# Patient Record
Sex: Male | Born: 1935
Health system: Southern US, Community
[De-identification: ages and names within clinical notes are randomized; demographics above are authoritative.]

## PROBLEM LIST (undated history)

## (undated) DIAGNOSIS — K589 Irritable bowel syndrome without diarrhea: Secondary | ICD-10-CM

## (undated) DIAGNOSIS — J309 Allergic rhinitis, unspecified: Secondary | ICD-10-CM

## (undated) DIAGNOSIS — N281 Cyst of kidney, acquired: Secondary | ICD-10-CM

## (undated) DIAGNOSIS — M1990 Unspecified osteoarthritis, unspecified site: Secondary | ICD-10-CM

## (undated) DIAGNOSIS — E785 Hyperlipidemia, unspecified: Secondary | ICD-10-CM

## (undated) DIAGNOSIS — K219 Gastro-esophageal reflux disease without esophagitis: Secondary | ICD-10-CM

## (undated) DIAGNOSIS — R109 Unspecified abdominal pain: Secondary | ICD-10-CM

## (undated) DIAGNOSIS — K5730 Diverticulosis of large intestine without perforation or abscess without bleeding: Secondary | ICD-10-CM

## (undated) DIAGNOSIS — I2510 Atherosclerotic heart disease of native coronary artery without angina pectoris: Secondary | ICD-10-CM

## (undated) DIAGNOSIS — M545 Low back pain, unspecified: Secondary | ICD-10-CM

## (undated) DIAGNOSIS — I255 Ischemic cardiomyopathy: Secondary | ICD-10-CM

## (undated) DIAGNOSIS — G43909 Migraine, unspecified, not intractable, without status migrainosus: Secondary | ICD-10-CM

## (undated) DIAGNOSIS — I10 Essential (primary) hypertension: Secondary | ICD-10-CM

## (undated) DIAGNOSIS — R972 Elevated prostate specific antigen [PSA]: Secondary | ICD-10-CM

## (undated) HISTORY — DX: Ischemic cardiomyopathy: I25.5

## (undated) HISTORY — PX: ANGIOPLASTY: SHX39

## (undated) HISTORY — PX: CARDIAC CATHETERIZATION: SHX172

## (undated) HISTORY — DX: Diverticulosis of large intestine without perforation or abscess without bleeding: K57.30

## (undated) HISTORY — DX: Cyst of kidney, acquired: N28.1

## (undated) HISTORY — DX: Essential (primary) hypertension: I10

## (undated) HISTORY — DX: Unspecified abdominal pain: R10.9

## (undated) HISTORY — DX: Elevated prostate specific antigen (PSA): R97.20

## (undated) HISTORY — DX: Unspecified osteoarthritis, unspecified site: M19.90

## (undated) HISTORY — DX: Gastro-esophageal reflux disease without esophagitis: K21.9

## (undated) HISTORY — DX: Irritable bowel syndrome, unspecified: K58.9

## (undated) HISTORY — PX: CORONARY ANGIOPLASTY: SHX604

## (undated) HISTORY — DX: Low back pain, unspecified: M54.50

## (undated) HISTORY — DX: Atherosclerotic heart disease of native coronary artery without angina pectoris: I25.10

## (undated) HISTORY — DX: Allergic rhinitis, unspecified: J30.9

## (undated) HISTORY — DX: Hyperlipidemia, unspecified: E78.5

## (undated) HISTORY — DX: Migraine, unspecified, not intractable, without status migrainosus: G43.909

## (undated) HISTORY — DX: Low back pain: M54.5

## (undated) HISTORY — PX: OTHER SURGICAL HISTORY: SHX169

---

## 1999-12-25 ENCOUNTER — Other Ambulatory Visit: Admission: RE | Admit: 1999-12-25 | Discharge: 1999-12-25 | Payer: Self-pay | Admitting: Urology

## 1999-12-25 ENCOUNTER — Encounter (INDEPENDENT_AMBULATORY_CARE_PROVIDER_SITE_OTHER): Payer: Self-pay | Admitting: Specialist

## 2002-04-12 ENCOUNTER — Inpatient Hospital Stay (HOSPITAL_COMMUNITY): Admission: EM | Admit: 2002-04-12 | Discharge: 2002-04-14 | Payer: Self-pay | Admitting: Orthopedic Surgery

## 2002-07-10 ENCOUNTER — Encounter: Admission: RE | Admit: 2002-07-10 | Discharge: 2002-08-21 | Payer: Self-pay | Admitting: Orthopedic Surgery

## 2003-07-06 ENCOUNTER — Encounter (INDEPENDENT_AMBULATORY_CARE_PROVIDER_SITE_OTHER): Payer: Self-pay | Admitting: *Deleted

## 2004-02-14 ENCOUNTER — Observation Stay (HOSPITAL_COMMUNITY): Admission: RE | Admit: 2004-02-14 | Discharge: 2004-02-15 | Payer: Self-pay | Admitting: Specialist

## 2004-08-01 ENCOUNTER — Ambulatory Visit: Payer: Self-pay | Admitting: Pulmonary Disease

## 2004-08-15 ENCOUNTER — Ambulatory Visit (HOSPITAL_COMMUNITY): Admission: RE | Admit: 2004-08-15 | Discharge: 2004-08-15 | Payer: Self-pay | Admitting: Pulmonary Disease

## 2004-10-28 ENCOUNTER — Ambulatory Visit: Payer: Self-pay | Admitting: Pulmonary Disease

## 2004-12-20 HISTORY — PX: OTHER SURGICAL HISTORY: SHX169

## 2004-12-25 ENCOUNTER — Observation Stay (HOSPITAL_COMMUNITY): Admission: RE | Admit: 2004-12-25 | Discharge: 2004-12-26 | Payer: Self-pay | Admitting: Surgery

## 2006-02-10 ENCOUNTER — Ambulatory Visit: Payer: Self-pay | Admitting: Pulmonary Disease

## 2006-05-04 ENCOUNTER — Ambulatory Visit: Payer: Self-pay | Admitting: Pulmonary Disease

## 2006-08-19 ENCOUNTER — Ambulatory Visit: Payer: Self-pay | Admitting: Pulmonary Disease

## 2006-08-30 ENCOUNTER — Ambulatory Visit: Payer: Self-pay | Admitting: Pulmonary Disease

## 2007-07-01 ENCOUNTER — Encounter: Payer: Self-pay | Admitting: Pulmonary Disease

## 2007-07-22 ENCOUNTER — Ambulatory Visit: Payer: Self-pay | Admitting: Pulmonary Disease

## 2007-07-22 ENCOUNTER — Telehealth (INDEPENDENT_AMBULATORY_CARE_PROVIDER_SITE_OTHER): Payer: Self-pay | Admitting: *Deleted

## 2007-07-22 DIAGNOSIS — M1990 Unspecified osteoarthritis, unspecified site: Secondary | ICD-10-CM | POA: Insufficient documentation

## 2007-07-22 DIAGNOSIS — R109 Unspecified abdominal pain: Secondary | ICD-10-CM | POA: Insufficient documentation

## 2007-07-22 DIAGNOSIS — J309 Allergic rhinitis, unspecified: Secondary | ICD-10-CM

## 2007-07-22 DIAGNOSIS — K589 Irritable bowel syndrome without diarrhea: Secondary | ICD-10-CM

## 2007-07-22 LAB — CONVERTED CEMR LAB
ALT: 21 units/L (ref 0–53)
AST: 10 units/L (ref 0–37)
Albumin: 3.8 g/dL (ref 3.5–5.2)
Alkaline Phosphatase: 76 units/L (ref 39–117)
BUN: 19 mg/dL (ref 6–23)
Bacteria, UA: NEGATIVE
Basophils Absolute: 0 10*3/uL (ref 0.0–0.1)
Bilirubin, Direct: 0.3 mg/dL (ref 0.0–0.3)
Chloride: 102 meq/L (ref 96–112)
Eosinophils Absolute: 0.1 10*3/uL (ref 0.0–0.6)
Eosinophils Relative: 0.8 % (ref 0.0–5.0)
GFR calc Af Amer: 122 mL/min
GFR calc non Af Amer: 101 mL/min
HCT: 47.4 % (ref 39.0–52.0)
Hemoglobin: 15.9 g/dL (ref 13.0–17.0)
MCV: 90.1 fL (ref 78.0–100.0)
Monocytes Absolute: 0.8 10*3/uL — ABNORMAL HIGH (ref 0.2–0.7)
Mucus, UA: NEGATIVE
Neutro Abs: 8.2 10*3/uL — ABNORMAL HIGH (ref 1.4–7.7)
Nitrite: NEGATIVE
Potassium: 4 meq/L (ref 3.5–5.1)
RBC: 5.26 M/uL (ref 4.22–5.81)
Sed Rate: 27 mm/hr — ABNORMAL HIGH (ref 0–20)
Sodium: 139 meq/L (ref 135–145)
Specific Gravity, Urine: >=1.03 (ref 1.000–1.03)
Total Bilirubin: 2 mg/dL — ABNORMAL HIGH (ref 0.3–1.2)
pH: 5 (ref 5.0–8.0)

## 2007-07-29 ENCOUNTER — Telehealth: Payer: Self-pay | Admitting: Pulmonary Disease

## 2007-08-12 ENCOUNTER — Encounter: Payer: Self-pay | Admitting: Pulmonary Disease

## 2007-08-12 ENCOUNTER — Encounter: Payer: Self-pay | Admitting: Adult Health

## 2007-08-23 ENCOUNTER — Ambulatory Visit: Payer: Self-pay | Admitting: Pulmonary Disease

## 2007-08-23 DIAGNOSIS — R972 Elevated prostate specific antigen [PSA]: Secondary | ICD-10-CM

## 2007-08-23 DIAGNOSIS — K5730 Diverticulosis of large intestine without perforation or abscess without bleeding: Secondary | ICD-10-CM | POA: Insufficient documentation

## 2007-08-23 DIAGNOSIS — G43909 Migraine, unspecified, not intractable, without status migrainosus: Secondary | ICD-10-CM | POA: Insufficient documentation

## 2007-08-23 DIAGNOSIS — M545 Low back pain: Secondary | ICD-10-CM

## 2007-08-23 DIAGNOSIS — N281 Cyst of kidney, acquired: Secondary | ICD-10-CM | POA: Insufficient documentation

## 2007-08-24 ENCOUNTER — Ambulatory Visit (HOSPITAL_COMMUNITY): Admission: RE | Admit: 2007-08-24 | Discharge: 2007-08-24 | Payer: Self-pay | Admitting: Pulmonary Disease

## 2007-11-24 ENCOUNTER — Ambulatory Visit: Payer: Self-pay | Admitting: Internal Medicine

## 2007-11-24 DIAGNOSIS — L500 Allergic urticaria: Secondary | ICD-10-CM | POA: Insufficient documentation

## 2007-11-24 LAB — CONVERTED CEMR LAB
Basophils Relative: 0.1 % (ref 0.0–1.0)
Eosinophils Relative: 1.5 % (ref 0.0–5.0)
Hemoglobin: 14.9 g/dL (ref 13.0–17.0)
MCHC: 35.3 g/dL (ref 30.0–36.0)
MCV: 89.2 fL (ref 78.0–100.0)
Neutro Abs: 4.7 10*3/uL (ref 1.4–7.7)
WBC: 6.6 10*3/uL (ref 4.5–10.5)

## 2007-11-25 ENCOUNTER — Telehealth (INDEPENDENT_AMBULATORY_CARE_PROVIDER_SITE_OTHER): Payer: Self-pay | Admitting: *Deleted

## 2007-12-15 ENCOUNTER — Telehealth (INDEPENDENT_AMBULATORY_CARE_PROVIDER_SITE_OTHER): Payer: Self-pay | Admitting: *Deleted

## 2007-12-16 ENCOUNTER — Ambulatory Visit: Payer: Self-pay | Admitting: Internal Medicine

## 2008-02-13 ENCOUNTER — Ambulatory Visit: Payer: Self-pay | Admitting: Pulmonary Disease

## 2008-02-13 DIAGNOSIS — J069 Acute upper respiratory infection, unspecified: Secondary | ICD-10-CM | POA: Insufficient documentation

## 2008-02-21 ENCOUNTER — Telehealth (INDEPENDENT_AMBULATORY_CARE_PROVIDER_SITE_OTHER): Payer: Self-pay | Admitting: *Deleted

## 2008-03-21 ENCOUNTER — Ambulatory Visit: Payer: Self-pay | Admitting: Pulmonary Disease

## 2008-10-21 ENCOUNTER — Inpatient Hospital Stay (HOSPITAL_COMMUNITY): Admission: EM | Admit: 2008-10-21 | Discharge: 2008-10-24 | Payer: Self-pay | Admitting: Emergency Medicine

## 2008-10-21 ENCOUNTER — Ambulatory Visit: Payer: Self-pay | Admitting: Cardiology

## 2008-10-22 ENCOUNTER — Encounter: Payer: Self-pay | Admitting: Cardiology

## 2008-10-26 ENCOUNTER — Telehealth: Payer: Self-pay | Admitting: Cardiology

## 2008-10-26 ENCOUNTER — Telehealth: Payer: Self-pay | Admitting: Pulmonary Disease

## 2008-11-08 ENCOUNTER — Encounter (HOSPITAL_COMMUNITY): Admission: RE | Admit: 2008-11-08 | Discharge: 2009-02-06 | Payer: Self-pay | Admitting: Cardiology

## 2008-11-12 ENCOUNTER — Ambulatory Visit: Payer: Self-pay | Admitting: Cardiology

## 2008-11-12 DIAGNOSIS — E7800 Pure hypercholesterolemia, unspecified: Secondary | ICD-10-CM

## 2008-11-12 DIAGNOSIS — I2510 Atherosclerotic heart disease of native coronary artery without angina pectoris: Secondary | ICD-10-CM | POA: Insufficient documentation

## 2008-11-12 DIAGNOSIS — I10 Essential (primary) hypertension: Secondary | ICD-10-CM

## 2008-12-12 ENCOUNTER — Encounter: Payer: Self-pay | Admitting: Cardiology

## 2008-12-25 ENCOUNTER — Telehealth: Payer: Self-pay | Admitting: Cardiology

## 2009-02-01 ENCOUNTER — Encounter: Payer: Self-pay | Admitting: Pulmonary Disease

## 2009-02-04 ENCOUNTER — Ambulatory Visit: Payer: Self-pay | Admitting: Cardiology

## 2009-02-04 LAB — CONVERTED CEMR LAB
AST: 24 units/L (ref 0–37)
Albumin: 3.8 g/dL (ref 3.5–5.2)
Bilirubin, Direct: 0.2 mg/dL (ref 0.0–0.3)
Cholesterol: 99 mg/dL (ref 0–200)
HDL: 27.6 mg/dL — ABNORMAL LOW (ref >39.00)
Total CHOL/HDL Ratio: 4
Total Protein: 6 g/dL (ref 6.0–8.3)
Triglycerides: 157 mg/dL — ABNORMAL HIGH (ref 0.0–149.0)
VLDL: 31.4 mg/dL (ref 0.0–40.0)

## 2009-02-05 ENCOUNTER — Ambulatory Visit: Payer: Self-pay | Admitting: Cardiology

## 2009-02-05 DIAGNOSIS — R5383 Other fatigue: Secondary | ICD-10-CM

## 2009-02-07 ENCOUNTER — Ambulatory Visit: Payer: Self-pay

## 2009-02-07 ENCOUNTER — Encounter: Payer: Self-pay | Admitting: Cardiology

## 2009-02-26 ENCOUNTER — Telehealth: Payer: Self-pay | Admitting: Cardiology

## 2009-03-08 ENCOUNTER — Encounter: Payer: Self-pay | Admitting: Cardiology

## 2009-04-15 ENCOUNTER — Encounter: Payer: Self-pay | Admitting: Pulmonary Disease

## 2009-04-26 ENCOUNTER — Telehealth: Payer: Self-pay | Admitting: Cardiology

## 2009-06-26 ENCOUNTER — Telehealth: Payer: Self-pay | Admitting: Cardiology

## 2009-07-04 ENCOUNTER — Telehealth: Payer: Self-pay | Admitting: Adult Health

## 2009-07-17 ENCOUNTER — Telehealth: Payer: Self-pay | Admitting: Cardiology

## 2009-08-07 ENCOUNTER — Ambulatory Visit: Payer: Self-pay | Admitting: Cardiology

## 2009-08-07 LAB — CONVERTED CEMR LAB
AST: 27 units/L (ref 0–37)
Albumin: 3.9 g/dL (ref 3.5–5.2)
Chloride: 108 meq/L (ref 96–112)
Cholesterol: 102 mg/dL (ref 0–200)
GFR calc non Af Amer: 117.15 mL/min (ref >60)
Glucose, Bld: 103 mg/dL — ABNORMAL HIGH (ref 70–99)
HDL: 43.6 mg/dL (ref >39.00)
Potassium: 3.8 meq/L (ref 3.5–5.1)
Total Bilirubin: 1.4 mg/dL — ABNORMAL HIGH (ref 0.3–1.2)
Total Protein: 6 g/dL (ref 6.0–8.3)

## 2009-08-09 ENCOUNTER — Ambulatory Visit: Payer: Self-pay | Admitting: Cardiology

## 2009-08-09 DIAGNOSIS — I2589 Other forms of chronic ischemic heart disease: Secondary | ICD-10-CM

## 2009-08-23 ENCOUNTER — Ambulatory Visit: Payer: Self-pay | Admitting: Cardiology

## 2009-08-28 LAB — CONVERTED CEMR LAB
BUN: 13 mg/dL (ref 6–23)
Creatinine, Ser: 0.7 mg/dL (ref 0.4–1.5)
GFR calc non Af Amer: 117.13 mL/min (ref >60)

## 2009-09-30 ENCOUNTER — Ambulatory Visit: Payer: Self-pay | Admitting: Cardiology

## 2009-10-07 LAB — CONVERTED CEMR LAB
AST: 22 units/L (ref 0–37)
Albumin: 3.8 g/dL (ref 3.5–5.2)
Cholesterol: 104 mg/dL (ref 0–200)
HDL: 35.1 mg/dL — ABNORMAL LOW (ref >39.00)
LDL Cholesterol: 43 mg/dL (ref 0–99)
Total CHOL/HDL Ratio: 3
Total Protein: 6.3 g/dL (ref 6.0–8.3)

## 2009-11-20 ENCOUNTER — Telehealth: Payer: Self-pay | Admitting: Cardiology

## 2009-12-10 ENCOUNTER — Encounter (INDEPENDENT_AMBULATORY_CARE_PROVIDER_SITE_OTHER): Payer: Self-pay | Admitting: *Deleted

## 2009-12-11 ENCOUNTER — Telehealth: Payer: Self-pay | Admitting: Cardiology

## 2010-01-02 ENCOUNTER — Encounter (INDEPENDENT_AMBULATORY_CARE_PROVIDER_SITE_OTHER): Payer: Self-pay | Admitting: *Deleted

## 2010-02-10 ENCOUNTER — Telehealth: Payer: Self-pay | Admitting: Cardiology

## 2010-02-13 ENCOUNTER — Ambulatory Visit: Payer: Self-pay | Admitting: Cardiology

## 2010-02-17 ENCOUNTER — Telehealth: Payer: Self-pay | Admitting: Cardiology

## 2010-02-27 ENCOUNTER — Telehealth: Payer: Self-pay | Admitting: Cardiology

## 2010-03-25 ENCOUNTER — Telehealth: Payer: Self-pay | Admitting: Cardiology

## 2010-04-10 ENCOUNTER — Ambulatory Visit: Payer: Self-pay | Admitting: Cardiology

## 2010-04-14 ENCOUNTER — Ambulatory Visit: Payer: Self-pay | Admitting: Gastroenterology

## 2010-04-14 ENCOUNTER — Telehealth: Payer: Self-pay | Admitting: Cardiology

## 2010-04-14 LAB — CONVERTED CEMR LAB
AST: 24 units/L (ref 0–37)
Alkaline Phosphatase: 52 units/L (ref 39–117)
BUN: 19 mg/dL (ref 6–23)
Bilirubin, Direct: 0.2 mg/dL (ref 0.0–0.3)
CO2: 26 meq/L (ref 19–32)
Calcium: 8.8 mg/dL (ref 8.4–10.5)
Chloride: 107 meq/L (ref 96–112)
Cholesterol: 112 mg/dL (ref 0–200)
LDL Cholesterol: 58 mg/dL (ref 0–99)
Total Bilirubin: 1.4 mg/dL — ABNORMAL HIGH (ref 0.3–1.2)
Total Protein: 6 g/dL (ref 6.0–8.3)
Triglycerides: 100 mg/dL (ref 0.0–149.0)

## 2010-05-05 ENCOUNTER — Ambulatory Visit: Payer: Self-pay | Admitting: Cardiology

## 2010-05-12 LAB — CONVERTED CEMR LAB
Calcium: 8.8 mg/dL (ref 8.4–10.5)
Creatinine, Ser: 0.7 mg/dL (ref 0.4–1.5)
GFR calc non Af Amer: 113.17 mL/min (ref >60)

## 2010-05-20 ENCOUNTER — Encounter: Payer: Self-pay | Admitting: Cardiology

## 2010-06-25 ENCOUNTER — Telehealth: Payer: Self-pay | Admitting: Cardiology

## 2010-07-09 ENCOUNTER — Encounter: Payer: Self-pay | Admitting: Cardiology

## 2010-07-09 ENCOUNTER — Ambulatory Visit: Admission: RE | Admit: 2010-07-09 | Discharge: 2010-07-09 | Payer: Self-pay | Source: Home / Self Care

## 2010-07-09 ENCOUNTER — Other Ambulatory Visit: Payer: Self-pay | Admitting: Cardiology

## 2010-07-09 LAB — BASIC METABOLIC PANEL
BUN: 20 mg/dL (ref 6–23)
CO2: 29 mEq/L (ref 19–32)
Calcium: 9.4 mg/dL (ref 8.4–10.5)
Chloride: 108 mEq/L (ref 96–112)
Creatinine, Ser: 0.8 mg/dL (ref 0.4–1.5)
GFR: 97.35 mL/min (ref >60.00)
Glucose, Bld: 97 mg/dL (ref 70–99)
Potassium: 4.4 mEq/L (ref 3.5–5.1)
Sodium: 141 mEq/L (ref 135–145)

## 2010-07-09 LAB — HEMOGLOBIN A1C: Hgb A1c MFr Bld: 5.5 % (ref 4.6–6.5)

## 2010-07-09 LAB — MAGNESIUM: Magnesium: 2.1 mg/dL (ref 1.5–2.5)

## 2010-07-09 LAB — CK: Total CK: 97 U/L (ref 7–232)

## 2010-07-22 NOTE — Progress Notes (Signed)
Summary: pt going off plavix  Phone Note Call from Patient Call back at Home Phone [phone]   Caller: Patient Reason for Call: Talk to Nurse, Talk to Doctor Summary of Call: pt has dental appt and will be off plavix for five days starting tommorrow Initial call taken by: Omer Jack,  December 11, 2009 4:16 PM  Follow-up for Phone Call        Spoke with pt. Pt. states Anne Dr. Alford Highland nurse, had asked pt. to call to let her know when he was going off Plavix for 5 days for dental work. Pt. states will be off Plavix from June 23rd to June 29th.  Ollen Gross, RN, BSN  December 11, 2009 4:31 PM      Appended Document: pt going off plavix ok to go off plavix now for dental work. restart afterwards

## 2010-07-22 NOTE — Progress Notes (Signed)
Summary: cough  Phone Note Outgoing Call   Call placed by: Katina Dung, RN, BSN,  February 27, 2010 8:45 AM Call placed to: Patient Summary of Call: cough  Follow-up for Phone Call        Pepcid started 02/13/10--call and see if Pepcid has helped cough --LM with wife for pt to call Katina Dung, RN, BSN  February 27, 2010 9:21 AM --I talked with pt--he states he thinks his cough is some better since starting Pepcid--he is going out of town for a couple of weeks and wanted to see how his cough was when he returned from his trip before making any changes--he stated he would call me back when he returns from his trip if he was still having problems with a cough and wants to make a medication change     Appended Document: cough ok.

## 2010-07-22 NOTE — Progress Notes (Signed)
Summary: cough  Phone Note Outgoing Call   Call placed by: Katina Dung, RN, BSN,  April 14, 2010 2:46 PM Call placed to: Patient Summary of Call: lab results  Follow-up for Phone Call        I called pt to give him his recent lab results--pt mentioned that he has  tried  Pepcid to see if taking Pepcid improved his cough-(he is on Lisinopril)--pt states cough is slightly improved on Pepcid --he would like to try other medication since cough is not much better --I will forward to Dr Shirlee Latch for review     Appended Document: cough OK to stop lisinopril and start losartan 50 mg daily with BMET in 2 wks.   Appended Document: cough talked with pt --he will stop Lisinopril and start Losartan 50mg  daily--he will return for BMP on 05/01/10   Clinical Lists Changes  Medications: Changed medication from LISINOPRIL 5 MG TABS (LISINOPRIL) one tablet by mouth once daily to LOSARTAN POTASSIUM 50 MG TABS (LOSARTAN POTASSIUM) one daily - Signed Rx of LOSARTAN POTASSIUM 50 MG TABS (LOSARTAN POTASSIUM) one daily;  #30 x 6;  Signed;  Entered by: Katina Dung, RN, BSN;  Authorized by: Marca Ancona, MD;  Method used: Electronically to [facility] # [id number]*, [address] [place], [place], [place]  [address], Ph: [phone] or [phone], Fax: [phone]    Prescriptions: LOSARTAN POTASSIUM 50 MG TABS (LOSARTAN POTASSIUM) one daily  #30 x 6   Entered by:   Katina Dung, RN, BSN   Authorized by:   Marca Ancona, MD   Signed by:   Katina Dung, RN, BSN on 04/15/2010   Method used:   Electronically to        [facility]. # [id number]* (retail)       [address]       [place], [place]  [address]       Ph: [phone] or [phone]       Fax: [phone]   RxID:   [id number]

## 2010-07-22 NOTE — Progress Notes (Signed)
Summary: pt needs asap he leaving town   Phone Note Refill Request Call back at [facility] [phone] Message from:  [facility] [address]  Refills Requested: Medication #1:  NITROGLYCERIN 0.4 MG SUBL take as needed needs asap  Initial call taken by: Omer Jack,  February 17, 2010 12:06 PM  Follow-up for Phone Call        Rx completed in Dr. Tiajuana Amass notified pt Follow-up by: Hardin Negus, RMA,  February 17, 2010 3:12 PM

## 2010-07-22 NOTE — Letter (Signed)
Summary: New Patient letter  [facility]  [address] [place], [place] [address]   Phone: [phone]  Fax: [phone]       01/02/2010 MRN: [id number]  Willie Lynch [address] [place], [place]  [address]  Dear Willie Lynch,  Welcome to the Gastroenterology Division at [facility].    You are scheduled to see Dr.  Christella Hartigan  on 02/21/2010 at 1:30pm on the 3rd floor at [facility], [address]  We ask that you try to arrive at our office 15 minutes prior to your appointment time to allow for check-in.  We would like you to complete the enclosed self-administered evaluation form prior to your visit and bring it with you on the day of your appointment.  We will review it with you.  Also, please bring a complete list of all your medications or, if you prefer, bring the medication bottles and we will list them.  Please bring your insurance card so that we may make a copy of it.  If your insurance requires a referral to see a specialist, please bring your referral form from your primary care physician.  Co-payments are due at the time of your visit and may be paid by cash, check or credit card.     Your office visit will consist of a consult with your physician (includes a physical exam), any laboratory testing he/she may order, scheduling of any necessary diagnostic testing (e.g. x-ray, ultrasound, CT-scan), and scheduling of a procedure (e.g. Endoscopy, Colonoscopy) if required.  Please allow enough time on your schedule to allow for any/all of these possibilities.    If you cannot keep your appointment, please call [phone] to cancel or reschedule prior to your appointment date.  This allows [place] the opportunity to schedule an appointment for another patient in need of care.  If you do not cancel or reschedule by 5 p.m. the business day prior to your appointment date, you will be charged a $50.00 late cancellation/no-show fee.    Thank you for choosing  [facility] for your medical needs.  We appreciate the opportunity to care for you.  Please visit [place] at our website  to learn more about our practice.                     Sincerely,                                                             The Gastroenterology Division

## 2010-07-22 NOTE — Progress Notes (Signed)
Summary: PT RETURNING CALL.  Phone Note Call from Patient Call back at [facility] Phone [phone]   Caller: Patient Summary of Call: PT RETURN CALL IF NEED TO CALL BACK LEAVE MESS Initial call taken by: Judie Grieve,  June 26, 2009 3:38 PM  Follow-up for Phone Call        pt requesting refill of Ambien--asked pt to contact Dr Kriste Basque for Ambien refill--I talked with patient

## 2010-07-22 NOTE — Progress Notes (Signed)
Summary: CALLING REGARDING B/P READINGS  Phone Note Call from Patient Call back at Home Phone [phone]   Caller: Patient Summary of Call: PT CALLING WANTING TO KNOW  WHAT HIS LAST B/P READINGS WAS Initial call taken by: Judie Grieve,  March 25, 2010 4:34 PM  Follow-up for Phone Call        I talked with wife and gave her pt's B/P reading from 02/13/10

## 2010-07-22 NOTE — Assessment & Plan Note (Signed)
Summary: ROV/PER PT/JSS   Primary Provider:  Alroy Dust, MD  CC:  ROV/ pt has no complaints at this time.  He has not taken any medication today.  History of Present Illness: 77 yo with NSTEMI in 5/10 s/p PCI to LAD and RCA.  He has been doing well.  He is exercising 6 days a week for 30-35 minutes on a treadmill or elliptical.  On nice days he walks.  No exertional shortness of breath.  No chest pain.  He has had a hard time with Niaspan.  It seems to have brought his HDL up but he is having trouble sleeping after taking it.   Labs (5/10): TSH normal Labs (8/10): ALT, AST normal. LDL 40, HDL 28 Labs (2/11): LFTs normal, LDL 49, HDL 44, K 3.8, creatinine 0.7  ECG: NSR, normal  Current Medications (verified): 1)  Bayer Aspirin 325 Mg Tabs (Aspirin) .... Take One Tablet Once Daily 2)  Proscar 5 Mg  Tabs (Finasteride) .... Take 1 Tablet By Mouth Once A Day 3)  Uroxatral 10 Mg  Tb24 (Alfuzosin Hcl) .Marland Kitchen.. 1 By Mouth Daily 4)  Multivitamins   Tabs (Multiple Vitamin) .... Take 1 Tablet By Mouth Once A Day 5)  Zyrtec Allergy 10 Mg  Tabs (Cetirizine Hcl) .... As Needed 6)  Benadryl 25 Mg  Tabs (Diphenhydramine Hcl) .... Take One By Mouth As Needed Itching 7)  Travatan 0.004 % Soln (Travoprost) .... Use One Drop in Each Eye At Bedtime 8)  Nitroglycerin 0.4 Mg Subl (Nitroglycerin) .... Take As Needed 9)  Lisinopril 10 Mg Tabs (Lisinopril) .... One Tablet Daily 10)  Metoprolol Succinate 25 Mg Xr24h-Tab (Metoprolol Succinate) .Marland Kitchen.. 1 Daily 11)  Plavix 75 Mg Tabs (Clopidogrel Bisulfate) .... Take One Tablet Once Daily 12)  Timolol Maleate 0.5 % Solg (Timolol Maleate) .... Instill One Drop in Ou 13)  Ambien 5 Mg Tabs (Zolpidem Tartrate) .Marland Kitchen.. 1 Daily 14)  Pepcid Ac Maximum Strength 20 Mg Tabs (Famotidine) .Marland Kitchen.. 1 Daily 15)  Fish Oil 1000 Mg Caps (Omega-3 Fatty Acids) .... 2 Daily 16)  Crestor 40 Mg Tabs (Rosuvastatin Calcium) .... One Tablet Daily  Allergies (verified): 1)  ! Codeine 2)  !  Morphine 3)  ! Augmentin (Amoxicillin-Pot Clavulanate)  Past History:  Past Medical History: 1.  CAD:  NSTEMI 5/10.  Rotational atherectomy/PCI with Xience DES x 3 to RCA and rotational atherectomy/PCI with Xience DES to proximal LAD.   2.  ABDOMINAL PAIN (ICD-789.00) -. 3.  ALLERGIC RHINITIS (ICD-477.9) 4.  DIVERTICULOSIS OF COLON (ICD-562.10) - last colon 11/05 by DrSamLeB w/ divertics only... f/u rec 89yrs due to family hx of colon cancer in mother... 5.  IRRITABLE BOWEL SYNDROME (ICD-564.1) 6.  Hx of ELEVATED PROSTATE SPECIFIC ANTIGEN (ICD-790.93) - followed by Dr Vonita Moss for urology  7.  Hx of RENAL CYST (ICD-593.2) 8.  OSTEOARTHRITIS (ICD-715.90) 9.  LOW BACK PAIN, CHRONIC (ICD-724.2) -  10.  Hx of MIGRAINE HEADACHE (ICD-346.90) 11.  HTN 12.  Esophagitis/GERD 13.  Hyperlipidemia 14.  Ischemic cardiomyopathy: Mild.  Echo (8/10) with EF 45-50%, diffuse hypokinesis, mild AI and mild MR.   Family History: Reviewed history from 02/13/2008 and no changes required. Mother deceased age 65 - cancer Father deceased age 2 - heart disease One sibling alive age 49 One sibling deceased age 59 - unknown One sibling deceased age 80 -unknown  Social History: Reviewed history from 02/05/2009 and no changes required. Quit smoking 1963 Alcohol - 4 drinks per week Exercises Caffeine -  2 cups per day Married 2 children Previously worked for [employer], now Research scientist (medical).    Review of Systems       All systems reviewed and negative except as per HPI.   Vital Signs:  Patient profile:   78 year old male Height:      70 inches Weight:      177 pounds BMI:     25.49 Pulse rate:   56 / minute Pulse rhythm:   regular BP sitting:   136 / 74  (left arm) Cuff size:   large  Vitals Entered By: Judithe Modest CMA (August 09, 2009 8:21 AM)  Physical Exam  General:  Well developed, well nourished, in no acute distress. Neck:  Neck supple, no JVD. No masses, thyromegaly or abnormal  cervical nodes. Lungs:  Clear bilaterally to auscultation and percussion. Heart:  Non-displaced PMI, chest non-tender; regular rate and rhythm, S1, S2 without murmurs, rubs or gallops. Carotid upstroke normal, no bruit.  Pedals normal pulses. No edema, no varicosities.  Abdomen:  Bowel sounds positive; abdomen soft and non-tender without masses, organomegaly, or hernias noted. No hepatosplenomegaly. Extremities:  No clubbing or cyanosis. Neurologic:  Alert and oriented x 3. Psych:  Normal affect.   Impression & Recommendations:  Problem # 1:  CORONARY ATHEROSCLEROSIS NATIVE CORONARY ARTERY (ICD-414.01) Doing well, no ischemic symptoms.  Continue ASA, Plavix, statin, Toprol, and lisinopril.  Given complex lesions and multiple stents would prefer that he continue Plavix long-term.  He needs dental work done, would be ok to go off Plavix for a few days in 6/11 (stents in 5/10) for dental work then restart Plavix.   Problem # 2:  HYPERCHOLESTEROLEMIA (ICD-272.0) Having a hard time with Niaspan, really affecting his sleep.  Stop Niaspan and Lipitor, start him on Crestor (better HDL-raising effect than Lipitor).   Problem # 3:  ISCHEMIC CARDIOMYOPATHY (ICD-414.8) Mild, EF 45-50%.  Hold off on increase Toprol XL as HR is in the 50s.  Will increase lisinopril to 10 mg daily.  BMET in 2 wks.    Other Orders: EKG w/ Interpretation (93000)  Patient Instructions: 1)  Your physician has recommended you make the following change in your medication:  2)  Increase Lisinopril to 10mg  daily 3)  Stop Lipitor and Niaspan 4)  Start Crestor 40mg  daily 5)  Your physician recommends that you return for lab work in: 2 weeks for BMP 414.01 272.0  6)  Your physician recommends that you return for a FASTING lipid profile/liver profile in 2 months  414.01 272.0 v58.69 7)  Your physician wants you to follow-up in: 6 months with Dr Shirlee Latch.  You will receive a reminder letter in the mail two months in advance. If you  don't receive a letter, please call our office to schedule the follow-up appointment. Prescriptions: CRESTOR 40 MG TABS (ROSUVASTATIN CALCIUM) one tablet daily  #30 x 3   Entered by:   Katina Dung, RN, BSN   Authorized by:   Marca Ancona, MD   Signed by:   Katina Dung, RN, BSN on 08/09/2009   Method used:   Electronically to        [facility]. # [id number]* (retail)       [address]       [place], [place]  [address]       Ph: [phone] or [phone]       Fax: [phone]   RxID:   [id number]  LISINOPRIL 10 MG TABS (LISINOPRIL) one tablet daily  #30 x 6   Entered by:   Katina Dung, RN, BSN   Authorized by:   Marca Ancona, MD   Signed by:   Katina Dung, RN, BSN on 08/09/2009   Method used:   Electronically to        [facility]. # [id number]* (retail)       [address]       [place], [place]  [address]       Ph: [phone] or [phone]       Fax: [phone]   RxID:   [id number]

## 2010-07-22 NOTE — Assessment & Plan Note (Signed)
Summary: 6 MONTH ROV/SL   Primary Provider:  Alroy Dust, MD   History of Present Illness: 77 yo with NSTEMI in 5/10 s/p PCI to LAD and RCA.  He has been doing well.  He is exercising 6 days a week for 30-35 minutes on a treadmill or elliptical.  On nice days he walks or plays golf.  No exertional shortness of breath.  No chest pain.  He occasionally gets some nausea when he takes all his pills at once.  He does report a dry cough only in the afternoons for the last couple of weeks.  He denies post-nasal drip/allergies.  Occasional heart burn.   Labs (5/10): TSH normal Labs (8/10): ALT, AST normal. LDL 40, HDL 28 Labs (2/11): LFTs normal, LDL 49, HDL 44, K 3.8, creatinine 0.7 Labs (3/11): K 4.2, creatinine 0.7 Labs (4/11): LFTs nromal, LDL 43, HDL 35  ECG: NSR, normal   Current Medications (verified): 1)  Bayer Aspirin 325 Mg Tabs (Aspirin) .... Take One Tablet Once Daily 2)  Proscar 5 Mg  Tabs (Finasteride) .... Take 1 Tablet By Mouth Once A Day 3)  Uroxatral 10 Mg  Tb24 (Alfuzosin Hcl) .Marland Kitchen.. 1 By Mouth Daily 4)  Multivitamins   Tabs (Multiple Vitamin) .... Take 1 Tablet By Mouth Once A Day 5)  Zyrtec Allergy 10 Mg  Tabs (Cetirizine Hcl) .... As Needed 6)  Benadryl 25 Mg  Tabs (Diphenhydramine Hcl) .... Take One By Mouth As Needed Itching 7)  Travatan 0.004 % Soln (Travoprost) .... Use One Drop in Each Eye At Bedtime 8)  Nitroglycerin 0.4 Mg Subl (Nitroglycerin) .... Take As Needed 9)  Lisinopril 10 Mg Tabs (Lisinopril) .... One Tablet Daily 10)  Metoprolol Succinate 25 Mg Xr24h-Tab (Metoprolol Succinate) .Marland Kitchen.. 1 Daily 11)  Plavix 75 Mg Tabs (Clopidogrel Bisulfate) .... Take One Tablet Once Daily 12)  Timolol Maleate 0.5 % Solg (Timolol Maleate) .... Instill One Drop in Ou 13)  Ambien 5 Mg Tabs (Zolpidem Tartrate) .Marland Kitchen.. 1 Daily 14)  Pepcid Ac Maximum Strength 20 Mg Tabs (Famotidine) .Marland Kitchen.. 1 Daily 15)  Fish Oil 1000 Mg Caps (Omega-3 Fatty Acids) .... 2 Daily 16)  Crestor 40 Mg Tabs  (Rosuvastatin Calcium) .... One Tablet Daily  Allergies (verified): 1)  ! Codeine 2)  ! Morphine 3)  ! Augmentin (Amoxicillin-Pot Clavulanate)  Past History:  Past Medical History: Reviewed history from 08/09/2009 and no changes required. 1.  CAD:  NSTEMI 5/10.  Rotational atherectomy/PCI with Xience DES x 3 to RCA and rotational atherectomy/PCI with Xience DES to proximal LAD.   2.  ABDOMINAL PAIN (ICD-789.00) -. 3.  ALLERGIC RHINITIS (ICD-477.9) 4.  DIVERTICULOSIS OF COLON (ICD-562.10) - last colon 11/05 by DrSamLeB w/ divertics only... f/u rec 53yrs due to family hx of colon cancer in mother... 5.  IRRITABLE BOWEL SYNDROME (ICD-564.1) 6.  Hx of ELEVATED PROSTATE SPECIFIC ANTIGEN (ICD-790.93) - followed by Dr Vonita Moss for urology  7.  Hx of RENAL CYST (ICD-593.2) 8.  OSTEOARTHRITIS (ICD-715.90) 9.  LOW BACK PAIN, CHRONIC (ICD-724.2) -  10.  Hx of MIGRAINE HEADACHE (ICD-346.90) 11.  HTN 12.  Esophagitis/GERD 13.  Hyperlipidemia 14.  Ischemic cardiomyopathy: Mild.  Echo (8/10) with EF 45-50%, diffuse hypokinesis, mild AI and mild MR.   Family History: Reviewed history from 02/13/2008 and no changes required. Mother deceased age 89 - cancer Father deceased age 15 - heart disease One sibling alive age 31 One sibling deceased age 10 - unknown One sibling deceased age 41 -unknown  Social History: Reviewed history from 02/05/2009 and no changes required. Quit smoking 1963 Alcohol - 4 drinks per week Exercises Caffeine - 2 cups per day Married 2 children Previously worked for [employer], now Research scientist (medical).    Vital Signs:  Patient profile:   77 year old male Height:      70 inches Weight:      179 pounds BMI:     25.78 Pulse rate:   58 / minute Resp:     18 per minute BP sitting:   108 / 76  (left arm)  Vitals Entered By: Marrion Coy, CNA (February 13, 2010 8:06 AM)  Physical Exam  General:  Well developed, well nourished, in no acute distress. Neck:  Neck supple,  no JVD. No masses, thyromegaly or abnormal cervical nodes. Lungs:  Clear bilaterally to auscultation and percussion. Heart:  Non-displaced PMI, chest non-tender; regular rate and rhythm, S1, S2 without murmurs, rubs or gallops. Carotid upstroke normal, no bruit.  Pedals normal pulses. No edema, no varicosities.  Abdomen:  Bowel sounds positive; abdomen soft and non-tender without masses, organomegaly, or hernias noted. No hepatosplenomegaly. Extremities:  No clubbing or cyanosis. Neurologic:  Alert and oriented x 3. Psych:  Normal affect.   Impression & Recommendations:  Problem # 1:  CORONARY ATHEROSCLEROSIS NATIVE CORONARY ARTERY (ICD-414.01) Doing well, no ischemic symptoms.  Continue ASA, Plavix, statin, Toprol, and lisinopril.  Given complex lesions and multiple stents would prefer that he continue Plavix long-term.  It would be ok for him to decrease aspirin to 162 mg daily.  Given occasional nausea with his pills, I asked him to divide them up so he takes some in the morning and some in the evening.    Problem # 2:  HYPERCHOLESTEROLEMIA (ICD-272.0) Last lipids looked good.  Repeat lipids/LFTs in 10/11 (goal LDL < 70).   Problem # 3:  ISCHEMIC CARDIOMYOPATHY (ICD-414.8) Mild, EF 45-50%.  Continue Toprol XL and lisinopril.   Problem # 4:  COUGH Could potentially be related to ACEI.  Has been mild and only in the afternoon.  He does have occasional heart burn so I will have him try some over the counter Pepcid to see if this is a GERD-related cough.  We will call him in 2 wks, and if he is still coughing, I will have him change his ACEI to an ARB.   Patient Instructions: 1)  Your physician has recommended you make the following change in your medication:  2)  Decrease Aspirin to 162mg  daily--this will be two 81mg  Aspirin daily--this should be buffered or coated. 3)  Take Pepcid 20mg  daily--you do not need a prescription for this. 4)  I will call you in 2 weeks to check on your  cough. 5)  Your physician recommends that you return for a FASTING lipid profile/liver profile/BMP October 18,2011 6)  Your physician wants you to follow-up in: 6 months with Dr Shirlee Latch.  You will receive a reminder letter in the mail two months in advance. If you don't receive a letter, please call our office to schedule the follow-up appointment.

## 2010-07-22 NOTE — Progress Notes (Signed)
Summary: pt has questions regarding meds  Phone Note Call from Patient Call back at Home Phone [phone]   Caller: Patient Reason for Call: Talk to Nurse, Talk to Doctor Summary of Call: pt has medication question on all meds Initial call taken by: Omer Jack,  November 20, 2009 12:10 PM  Follow-up for Phone Call        Pt. would like to know  if he can be off Plavix for dental work and back injections some time in mid June. Pt. would like to know what he needs to do to get it aproved by MD.  I let pt. know  he needs to call the office back wth  information of  how long his dentist needs for  him  to be off medication and the date of procedure. Pt. verbalized understanding. Will call back with that information. Ollen Gross, RN, BSN  November 20, 2009 1:28 PM       Appended Document: pt has questions regarding meds Has been greater than 1 year on Plavix post-PCI.  Stop 5 days prior to procedure, restart afterwards.   Appended Document: pt has questions regarding meds discussed with pt by telephone-he is aware of Dr Alford Highland recommendations

## 2010-07-22 NOTE — Progress Notes (Signed)
Summary: WANT LABS ONLY WANT TO TALK TO ANNE  Phone Note Call from Patient Call back at [facility] Phone [phone]   Caller: Patient Summary of Call: PT CALLING WANTING TO HAVE LABS DRAWN BEFORE HIS APPT. Initial call taken by: Judie Grieve,  July 17, 2009 1:33 PM  Follow-up for Phone Call        talked with pt --rescheduled lab to 08-06-09 for lipid/liver/BMP prior to appt with Dr Shirlee Latch 08-09-09

## 2010-07-22 NOTE — Progress Notes (Signed)
Summary: rx request-RX sent ATC NA  Phone Note Call from Patient Call back at Home Phone [phone]   Caller: Spouse Call For: NADEL Summary of Call: spouse says pt requests sleep meds. says she talked to tp previously about this. [facility] [address]  Initial call taken by: Tivis Ringer,  July 04, 2009 4:50 PM  Follow-up for Phone Call        TP do you want to give him a refill of the [place]??  he has not seen SN since early 2009---please advise.  thanks Randell Loop CMA  July 04, 2009 4:54 PM   Additional Follow-up for Phone Call Additional follow up Details #1::        that is fine x 1 with no refills.  #30, ambien 5mg  1 by mouth at bedtime as needed sleep sorry i forgot to send from weekend when saw pt at hospital.   Additional Follow-up by: Rubye Oaks NP,  July 04, 2009 5:15 PM    Additional Follow-up for Phone Call Additional follow up Details #2::    Rx was called to pharmacy with no refills.  ATC pt and advise this was done. NA and unable to leave a msg. Vernie Murders  July 04, 2009 5:24 PM  pt advised Remus Loffler sent. Carron Curie CMA  July 05, 2009 9:04 AM   Prescriptions: AMBIEN 5 MG TABS (ZOLPIDEM TARTRATE) 1 daily  #30 x 0   Entered by:   Vernie Murders   Authorized by:   Rubye Oaks NP   Signed by:   Vernie Murders on 07/04/2009   Method used:   Telephoned to ...       [facility]  [address] # [id number]* (retail)       [address]       [place], [place]  [address]       Ph: [phone] or [phone]       Fax: [phone]   RxID:   [id number]

## 2010-07-22 NOTE — Progress Notes (Signed)
Summary: pt wants to do lab work   Phone Note Call from Patient Call back at [employer] [phone]   Caller: Patient Reason for Call: Talk to Nurse, Talk to Doctor Summary of Call: pt wants to come do lab work tomorrow prior to appt  Initial call taken by: Omer Jack,  February 10, 2010 9:21 AM  Follow-up for Phone Call        Spoke with Pt. Patient stated he would like to come for blood work a day prior appointment with Dr. Shirlee Latch. Pt states Dr. Shirlee Latch stoped Lipitor and put him on Crestor 40 mg daily on 08/09/09. MD wanted to know if the medication was working. I let pt. know lipids and liver panel was check 2 months after started Crestor on 09/30/09. The Md will find out if he needs more blood work after seen him. Okay with pt. Follow-up by: Ollen Gross, RN, BSN,  February 10, 2010 9:49 AM     Appended Document: pt wants to do lab work  discussed with Dr Doree Fudge recheck lipid/liver profile in October 2011(6 months after last L/L in April 2011) --wife aware by telephone

## 2010-07-22 NOTE — Procedures (Signed)
Summary: colon   Colonoscopy  Procedure date:  07/06/2003  Findings:      Location:  [facility].    Colonoscopy  Procedure date:  07/06/2003  Findings:      Location:  [facility].   Patient Name: Willie Lynch, Willie Lynch MRN:  Procedure Procedures: Colonoscopy CPT: [phone].  Personnel: Endoscopist: Ulyess Mort, MD.  Exam Location: Exam performed in Outpatient Clinic. Outpatient  Patient Consent: Procedure, Alternatives, Risks and Benefits discussed, consent obtained, from patient. Consent was obtained by the RN.  Indications  Increased Risk Screening: For family history of colorectal neoplasia, in  parent  History  Current Medications: Patient is not currently taking Coumadin.  Pre-Exam Physical: Entire physical exam was normal.  Exam Exam: Extent of exam reached: Cecum, extent intended: Cecum.  The cecum was identified by appendiceal orifice and IC valve. Colon retroflexion performed. Images were not taken. ASA Classification: II. Tolerance: good.  Monitoring: Pulse and BP monitoring, Oximetry used. Supplemental O2 given.  Colon Prep Prep results: good.  Sedation Meds: Patient assessed and found to be appropriate for moderate (conscious) sedation. Fentanyl 100 mcg. given IV. Versed 10 mg. given IV.  Findings - DIVERTICULOSIS: Ascending Colon to Sigmoid Colon. ICD9: Diverticulosis: 562.10. Comments: moderately severe.  - DIVERTICULOSIS: Rectum. ICD9: Diverticulosis: 562.10.   Assessment Abnormal examination, see findings above.  Diagnoses: 562.10: Diverticulosis.  562.10: Diverticulosis.   Events  Unplanned Interventions: No intervention was required.  Unplanned Events: There were no complications. Plans Medication Plan: Continue current medications.  Patient Education: Patient given standard instructions for: Diverticulosis. Hemorrhoids. Yearly hemoccult testing recommended. Patient instructed to get routine  colonoscopy every 5 years.  Disposition: After procedure patient sent to recovery. After recovery patient sent home.

## 2010-07-22 NOTE — Letter (Signed)
Summary: Appointment - Reminder 2  [facility], Main Office  [address]   [place], [place] [address]   Phone: [phone]  Fax: [phone]     December 10, 2009 MRN: [id number]   ABDULWAHAB DEMELO [address] [place], [place]  [address]   Dear Mr. KERWIN,  Our records indicate that it is time to schedule a follow-up appointment with Dr. Shirlee Latch in August. It is very important that we reach you to schedule this appointment. We look forward to participating in your health care needs. Please contact us at the number listed above at your earliest convenience to schedule your appointment.  If you are unable to make an appointment at this time, give [place] a call so we can update our records.     Sincerely,   Migdalia Dk [facility] Scheduling Team

## 2010-07-22 NOTE — Assessment & Plan Note (Signed)
History of Present Illness Visit Type: new patient  Primary GI MD: Rob Bunting MD Primary Provider: Alroy Dust, MD Requesting Provider: na Chief Complaint: Consult colon History of Present Illness:      very pleasant 77 year old man whose last colonoscopy was in 06/2003.   He thinks his mother may have had colon cancer.  She died in 11/05/78 she was in her late 39s.  He himself has never had colon cancer or colon polyps that he recalls and none that I can find in reviewing her paper chart.  he has not had any significant bowel issues.  NO constipation, no bleeding, no overt GI bleeding.  he has been on plavix for over a year, DEStents.  has gained a little wieght lately.             Current Medications (verified): 1)  Aspir-Low 81 Mg Tbec (Aspirin) .... Four Daily By Mouth 2)  Proscar 5 Mg  Tabs (Finasteride) .... Take 1 Tablet By Mouth Once A Day 3)  Uroxatral 10 Mg  Tb24 (Alfuzosin Hcl) .Marland Kitchen.. 1 By Mouth Daily 4)  Multivitamins   Tabs (Multiple Vitamin) .... Take 1 Tablet By Mouth Once A Day 5)  Zyrtec Allergy 10 Mg  Tabs (Cetirizine Hcl) .... As Needed 6)  Benadryl 25 Mg  Tabs (Diphenhydramine Hcl) .... Take One By Mouth As Needed Itching 7)  Travatan 0.004 % Soln (Travoprost) .... Use One Drop in Each Eye At Bedtime 8)  Nitroglycerin 0.4 Mg Subl (Nitroglycerin) .... Take As Needed 9)  Lisinopril 5 Mg Tabs (Lisinopril) .... One Tablet By Mouth Once Daily 10)  Metoprolol Succinate 25 Mg Xr24h-Tab (Metoprolol Succinate) .Marland Kitchen.. 1 Daily 11)  Plavix 75 Mg Tabs (Clopidogrel Bisulfate) .... Take One Tablet Once Daily 12)  Timolol Maleate 0.5 % Solg (Timolol Maleate) .... Instill One Drop in Ou 13)  Ambien 5 Mg Tabs (Zolpidem Tartrate) .Marland Kitchen.. 1 Daily As Needed For Sleep 14)  Fish Oil 1000 Mg Caps (Omega-3 Fatty Acids) .... 2 Daily 15)  Pepcid 20 Mg Tabs (Famotidine) .... One Daily 16)  Zyrtec Allergy 10 Mg Caps (Cetirizine Hcl) .... As Needed 17)  Crestor 40 Mg Tabs (Rosuvastatin  Calcium) .... One Tablet By Mouth Once Daily  Allergies (verified): 1)  ! Codeine 2)  ! Morphine 3)  ! Augmentin (Amoxicillin-Pot Clavulanate)  Past History:  Past Medical History: 1.  CAD:  NSTEMI 5/10.  Rotational atherectomy/PCI with Xience DES x 3 to RCA and rotational atherectomy/PCI with Xience DES to proximal LAD.   2.  ABDOMINAL PAIN (ICD-789.00) -. 3.  ALLERGIC RHINITIS (ICD-477.9) 4.  DIVERTICULOSIS OF COLON (ICD-562.10) - last colon 11/05 by DrSamLeB w/ divertics only. 5.  IRRITABLE BOWEL SYNDROME (ICD-564.1) 6.  Hx of ELEVATED PROSTATE SPECIFIC ANTIGEN (ICD-790.93) - followed by Dr Vonita Moss for urology  7.  Hx of RENAL CYST (ICD-593.2) 8.  OSTEOARTHRITIS (ICD-715.90) 9.  LOW BACK PAIN, CHRONIC (ICD-724.2) -  10.  Hx of MIGRAINE HEADACHE (ICD-346.90) 11.  HTN 12.  Esophagitis/GERD 13.  Hyperlipidemia 14.  Ischemic cardiomyopathy: Mild.  Echo (8/10) with EF 45-50%, diffuse hypokinesis, mild AI and mild MR.   Past Surgical History: S/P rotator cuff surgery by DrAplington S/P bilat inguinal hernia repairs 7/06 by DrMartin   Family History: mother died in her early 71s from metastatic cancer, family believes it may have been colon cancer that was diagnosed in her 18s Father deceased age 68 - heart disease One sibling alive age 41 One sibling deceased age 91 -  unknown One sibling deceased age 65 -unknown  Social History: Quit smoking 1963 Alcohol - 4 drinks per week Exercises Caffeine - 2 cups per day Married 2 children Previously worked for [employer], now Research scientist (medical).      Review of Systems       Pertinent positive and negative review of systems were noted in the above HPI and GI specific review of systems.  All other review of systems was otherwise negative.   Vital Signs:  Patient profile:   77 year old male Height:      70 inches Weight:      180 pounds BMI:     25.92 BSA:     2.00 Pulse rate:   60 / minute Pulse rhythm:   regular BP sitting:    128 / 64  (left arm) Cuff size:   regular  Vitals Entered By: Ok Anis CMA (April 14, 2010 1:13 PM)  Physical Exam  Additional Exam:  Constitutional: generally well appearing Psychiatric: alert and oriented times 3 Eyes: extraocular movements intact Mouth: oropharynx moist, no lesions Neck: supple, no lymphadenopathy Cardiovascular: heart regular rate and rythm Lungs: CTA bilaterally Abdomen: soft, non-tender, non-distended, no obvious ascites, no peritoneal signs, normal bowel sounds Extremities: no lower extremity edema bilaterally Skin: no lesions on visible extremities    Impression & Recommendations:  Problem # 1:  Routine risk for colon cancer first-degree family member with colon cancer, diagnosed in her 92s does not put him at elevated risk for colon cancer himself. I think this is important point especially given the fact that he is taking Plavix for coronary artery, drug-eluting stents. We discussed current, nationally recognized colon cancer screening guidelines and he and I agreed that since he has never had polyps himself removed, has new GI symptoms, we will put him in our reminder system for a repeat colonoscopy at 10 years from his last one which would be January, 2015. He knows this assumes he has no symptoms. If he does have significant GI symptoms he will call.  Patient Instructions: 1)  You are at routine risk for colon cancer (mother had possible colon cancer after age 43 and you have never had colon cancer or polyps) so next colonoscopy would be at 10 year interval.  Jan 2015.  We will put you in our reminder system. 2)  This assumes no symptoms (bleeding, singificant changes in bowel habits). 3)  The medication list was reviewed and reconciled.  All changed / newly prescribed medications were explained.  A complete medication list was provided to the patient / caregiver.  Appended Document: colon recall    Clinical Lists Changes  Observations: Added  new observation of COLONNXTDUE: 06/2013 (04/14/2010 13:32)

## 2010-07-24 NOTE — Progress Notes (Signed)
Summary: blood pressure  Phone Note Call from Patient Call back at Home Phone [phone]   Caller: Patient Reason for Call: Talk to Nurse Summary of Call: pt would like to talk to anne re his blood pressure. Initial call taken by: Roe Coombs,  June 25, 2010 9:22 AM  Follow-up for Phone Call        I talked with pt --pt states  B/P readings in the last week have been 143/84 144/84 149/80 148/68 145/75 145/71 148/84--some of these readings are before taking medications--pt will continue to keep log of B/P after he has taken his medications and  bring to his appt with Dr Shirlee Latch 07/10/10--pt also states for the last month he has had more severe thumb and hip pain---he has a history of arthritis but feels the increase in pain may be related to Crestor--pt has tolerated Lipitor 80mg  in the past--pt also due to have his teeth cleaned-his dentist is requesting pt hold Plavix for 7 days prior to dental cleaning---I will forward to Dr Shirlee Latch for review      Appended Document: blood pressure Joint pains not usually from statin, tends to be muscle pain.  OK to hold Plavix for dental work, really only needs to hold 5 days.   Appended Document: blood pressure discussed with pt by telephone  Appended Document: blood pressure appt with Dr Shirlee Latch 07/09/10 not 07/10/10

## 2010-07-24 NOTE — Assessment & Plan Note (Signed)
Summary: Willie Lynch   Referring Provider:  na Primary Provider:  Alroy Dust, MD  CC:  check up.  History of Present Illness: 77 yo with NSTEMI in 5/10 s/p PCI to LAD and RCA.  He has been doing well.  He is exercising 6 days a week for 30-35 minutes on a treadmill or elliptical.  On nice days he walks or plays golf.  No exertional shortness of breath.  No chest pain.  He is off Toprol XL but is not sure why.  Systolic BP has been running a little higher at home, averaging in the 140s.  Main complaint has been some cramping in his legs at night.  He does not have cramps or myalgias during the day.   Labs (5/10): TSH normal Labs (8/10): ALT, AST normal. LDL 40, HDL 28 Labs (2/11): LFTs normal, LDL 49, HDL 44, K 3.8, creatinine 0.7 Labs (3/11): K 4.2, creatinine 0.7 Labs (4/11): LFTs nromal, LDL 43, HDL 35 Labs (10/11): HDL 34, LDL 58, LFTs normal Labs (16/10): K 4.1, creatinine 0.7  ECG: NSR, normal  Current Medications (verified): 1)  Aspir-Low 81 Mg Tbec (Aspirin) .... Four Daily By Mouth 2)  Proscar 5 Mg  Tabs (Finasteride) .... Take 1 Tablet By Mouth Once A Day 3)  Uroxatral 10 Mg  Tb24 (Alfuzosin Hcl) .Marland Kitchen.. 1 By Mouth Daily 4)  Multivitamins   Tabs (Multiple Vitamin) .... Take 1 Tablet By Mouth Once A Day 5)  Zyrtec Allergy 10 Mg  Tabs (Cetirizine Hcl) .... As Needed 6)  Benadryl 25 Mg  Tabs (Diphenhydramine Hcl) .... Take One By Mouth As Needed Itching 7)  Travatan 0.004 % Soln (Travoprost) .... Use One Drop in Each Eye At Bedtime 8)  Nitroglycerin 0.4 Mg Subl (Nitroglycerin) .... Take As Needed 9)  Losartan Potassium 50 Mg Tabs (Losartan Potassium) .... One Daily 10)  Plavix 75 Mg Tabs (Clopidogrel Bisulfate) .... Take One Tablet Once Daily 11)  Timolol Maleate 0.5 % Solg (Timolol Maleate) .... Instill One Drop in Ou 12)  Ambien 5 Mg Tabs (Zolpidem Tartrate) .Marland Kitchen.. 1 Daily As Needed For Sleep 13)  Fish Oil 1000 Mg Caps (Omega-3 Fatty Acids) .... 2 Daily 14)  Pepcid 20 Mg Tabs  (Famotidine) .... One Daily 15)  Crestor 40 Mg Tabs (Rosuvastatin Calcium) .... One Tablet By Mouth Once Daily  Allergies: 1)  ! Codeine 2)  ! Morphine 3)  ! Augmentin (Amoxicillin-Pot Clavulanate)  Past History:  Past Medical History: 1.  CAD:  NSTEMI 5/10.  Rotational atherectomy/PCI with Xience DES x 3 to RCA and rotational atherectomy/PCI with Xience DES to proximal LAD.   2.  ABDOMINAL PAIN (ICD-789.00) -. 3.  ALLERGIC RHINITIS (ICD-477.9) 4.  DIVERTICULOSIS OF COLON (ICD-562.10) - last colon 11/05 by DrSamLeB w/ divertics only. 5.  IRRITABLE BOWEL SYNDROME (ICD-564.1) 6.  Hx of ELEVATED PROSTATE SPECIFIC ANTIGEN (ICD-790.93) - followed by Dr Vonita Moss for urology  7.  Hx of RENAL CYST (ICD-593.2) 8.  OSTEOARTHRITIS (ICD-715.90) 9.  LOW BACK PAIN, CHRONIC (ICD-724.2) -  10.  Hx of MIGRAINE HEADACHE (ICD-346.90) 11.  HTN: ACEI cough. 12.  Esophagitis/GERD 13.  Hyperlipidemia 14.  Ischemic cardiomyopathy: Mild.  Echo (8/10) with EF 45-50%, diffuse hypokinesis, mild AI and mild MR.   Family History: Reviewed history from 04/14/2010 and no changes required. mother died in her early 20s from metastatic cancer, family believes it may have been colon cancer that was diagnosed in her 73s Father deceased age 78 - heart disease One sibling  alive age 59 One sibling deceased age 49 - unknown One sibling deceased age 33 -unknown  Social History: Reviewed history from 04/14/2010 and no changes required. Quit smoking 1963 Alcohol - 4 drinks per week Exercises Caffeine - 2 cups per day Married 2 children Previously worked for [employer], now Research scientist (medical).      Review of Systems       All systems reviewed and negative except as per HPI.   Vital Signs:  Patient profile:   77 year old male Height:      70 inches Weight:      181 pounds BMI:     26.06 Pulse rate:   59 / minute Resp:     16 per minute BP sitting:   142 / 85  (left arm)  Vitals Entered By: Kem Parkinson  (July 09, 2010 8:56 AM)  Physical Exam  General:  Well developed, well nourished, in no acute distress. Neck:  Neck supple, no JVD. No masses, thyromegaly or abnormal cervical nodes. Lungs:  Clear bilaterally to auscultation and percussion. Heart:  Non-displaced PMI, chest non-tender; regular rate and rhythm, S1, S2 without murmurs, rubs or gallops. Carotid upstroke normal, no bruit.  Pedals normal pulses. No edema, no varicosities.  Abdomen:  Bowel sounds positive; abdomen soft and non-tender without masses, organomegaly, or hernias noted. No hepatosplenomegaly. Extremities:  No clubbing or cyanosis. Neurologic:  Alert and oriented x 3. Psych:  Normal affect.   Impression & Recommendations:  Problem # 1:  CORONARY ATHEROSCLEROSIS NATIVE CORONARY ARTERY (ICD-414.01) Doing well, no ischemic symptoms.  Continue ASA, Plavix, statin, and losartan.  Given complex lesions and multiple stents would prefer that he continue Plavix long-term.  He should restart a beta blocker, will use Coreg 6.25 mg two times a day.   Problem # 2:  ISCHEMIC CARDIOMYOPATHY (ICD-414.8) Mild, EF 45-50%.  Continue losartan and will start Coreg.   Problem # 3:  MUSCLE CRAMPS These are occurring at night.  No myalgias or cramps during the day.  I will have him try magnesium oxide 400 mg daily.  He can also try a spoonful of vinegar when the cramps occur. If not better with these measures, he can try stopping Crestor for a week to see if it makes any difference.  CPK was normal when checked today.   Problem # 4:  HYPERTENSION (ICD-401.9) BP running a little above goal.  Starting Coreg, as above.   Other Orders: EKG w/ Interpretation (93000) TLB-BMP (Basic Metabolic Panel-BMET) (80048-METABOL) TLB-A1C / Hgb A1C (Glycohemoglobin) (83036-A1C) TLB-CK Total Only(Creatine Kinase/CPK) (82550-CK) TLB-Magnesium (Mg) (83735-MG)  Patient Instructions: 1)  Labwork today: CP-K/ magnesium/bmet/A1C (414.01). 2)  Start  Carvediolol (Coreg) 6.25mg  two times a day. 3)  Start Magnesium Oxide 400mg  once daily (over the counter). 4)  Take a spoonful of vinegar for muscle cramps. 5)  Your physician wants you to follow-up in: 6 months.  You will receive a reminder letter in the mail two months in advance. If you don't receive a letter, please call our office to schedule the follow-up appointment. Prescriptions: CARVEDILOL 6.25 MG TABS (CARVEDILOL) Take one tablet by mouth twice a day  #60 x 6   Entered by:   Sherri Rad, RN, BSN   Authorized by:   Marca Ancona, MD   Signed by:   Sherri Rad, RN, BSN on 07/09/2010   Method used:   Electronically to        [id number] (retail)  [address]       [place], [place]  [address]       Ph: [phone] or [phone]       Fax: [phone]   RxID:   [id number]

## 2010-08-01 ENCOUNTER — Telehealth: Payer: Self-pay | Admitting: Cardiology

## 2010-08-13 NOTE — Progress Notes (Signed)
Summary: Bad stomache pains  Phone Note Call from Patient   Caller: Patient Summary of Call: Pt having real bad stomach pains thinks it's coming form medication thinks hhas a ulcer Initial call taken by: Judie Grieve,  August 01, 2010 2:27 PM  Follow-up for Phone Call        pt called c/o "serious stomach problems".  c/o cramps when he eats like before when he had an ulcer.  Pt states that the only thing that has changed is that Dr Shirlee Latch changed his medications at his last office visit.  Pt states that he is not using the vinegar he was told to take daily however he is using Mg OTC and did increase the carvedilol.  Instructed pt to hold the MG OTC for the next several days to see if any improvement but that he should not change any other medications at this time.  If s/s persist he needs to call back but that he may want to schedule to see the MD who took care of him when he had the "stomach ulcer".  Pt stated that he is sure he doesn't have an ulcer now and that wouldn't be necessary.  Will forward to MD for review and comments Follow-up by: Charolotte Capuchin, RN,  August 01, 2010 2:57 PM     Appended Document: Bad stomache pains Is he having any blood in his stool or melena? Can try protonix 40 mg daily.  If pain persists, should also get a CBC to make sure no blood loss.  If the pain is severe and persistent, needs office evaluation here or with a GI MD or with PCP.   Appended Document: Bad stomache pains LMTCB   Appended Document: Bad stomache pains LMTCB   Appended Document: Bad stomache pains answering machine  Appended Document: Bad stomache pains unable to reach pt after numerous attempts

## 2010-08-28 NOTE — Letter (Signed)
Summary: [facility]   Imported By: Marylou Mccoy 08/13/2010 14:37:40  _____________________________________________________________________  External Attachment:    Type:   Image     Comment:   External Document

## 2010-09-08 ENCOUNTER — Telehealth (INDEPENDENT_AMBULATORY_CARE_PROVIDER_SITE_OTHER): Payer: Self-pay | Admitting: *Deleted

## 2010-09-18 NOTE — Progress Notes (Signed)
Summary: rx req  Phone Note Call from Patient Call back at Home Phone [phone]   Caller: Spouse Mrs. Dayal Call For: nadel Summary of Call: wants rx for shingle vaccine- [facility] [address] Initial call taken by: Tivis Ringer, CNA,  September 08, 2010 11:31 AM  Follow-up for Phone Call        Pt's wife informed that rx for Shingle vaccine was sent to pharmacy. Abigail Miyamoto RN  September 08, 2010 1:20 PM     New/Updated Medications: VARIVAX 1350 PFU/0.5ML INJ (VARICELLA VIRUS VACCINE LIVE) Take as directed Prescriptions: VARIVAX 1350 PFU/0.5ML INJ (VARICELLA VIRUS VACCINE LIVE) Take as directed  #1 x 0   Entered by:   Abigail Miyamoto RN   Authorized by:   Michele Mcalpine MD   Signed by:   Abigail Miyamoto RN on 09/08/2010   Method used:   Electronically to        [facility]  [address] [id number]* (retail)       [address]       [place], [place]  [address]       Ph: [phone] or [phone]       Fax: [phone]   RxID:   [id number]

## 2010-09-30 LAB — CBC
HCT: 34.6 % — ABNORMAL LOW (ref 39.0–52.0)
HCT: 41.2 % (ref 39.0–52.0)
Hemoglobin: 14.5 g/dL (ref 13.0–17.0)
MCHC: 35 g/dL (ref 30.0–36.0)
MCHC: 35.3 g/dL (ref 30.0–36.0)
MCHC: 35.8 g/dL (ref 30.0–36.0)
MCV: 90.1 fL (ref 78.0–100.0)
Platelets: 100 10*3/uL — ABNORMAL LOW (ref 150–400)
Platelets: 87 10*3/uL — ABNORMAL LOW (ref 150–400)
RBC: 4.72 MIL/uL (ref 4.22–5.81)
RDW: 14.4 % (ref 11.5–15.5)
RDW: 14.5 % (ref 11.5–15.5)

## 2010-09-30 LAB — POCT CARDIAC MARKERS
Myoglobin, poc: 120 ng/mL (ref 12–200)
Troponin i, poc: 0.06 ng/mL (ref 0.00–0.09)
Troponin i, poc: 0.1 ng/mL — ABNORMAL HIGH (ref 0.00–0.09)

## 2010-09-30 LAB — COMPREHENSIVE METABOLIC PANEL
ALT: 19 U/L (ref 0–53)
AST: 20 U/L (ref 0–37)
Albumin: 3.8 g/dL (ref 3.5–5.2)
BUN: 10 mg/dL (ref 6–23)
CO2: 24 mEq/L (ref 19–32)
Calcium: 8.6 mg/dL (ref 8.4–10.5)
Glucose, Bld: 103 mg/dL — ABNORMAL HIGH (ref 70–99)
Total Protein: 6 g/dL (ref 6.0–8.3)

## 2010-09-30 LAB — BASIC METABOLIC PANEL
BUN: 11 mg/dL (ref 6–23)
BUN: 9 mg/dL (ref 6–23)
Calcium: 8.2 mg/dL — ABNORMAL LOW (ref 8.4–10.5)
Calcium: 8.7 mg/dL (ref 8.4–10.5)
Chloride: 107 mEq/L (ref 96–112)
Creatinine, Ser: 0.68 mg/dL (ref 0.4–1.5)
Creatinine, Ser: 0.8 mg/dL (ref 0.4–1.5)
GFR calc Af Amer: >60 mL/min (ref >60)
GFR calc non Af Amer: >60 mL/min (ref >60)
GFR calc non Af Amer: >60 mL/min (ref >60)
GFR calc non Af Amer: >60 mL/min (ref >60)
Glucose, Bld: 111 mg/dL — ABNORMAL HIGH (ref 70–99)
Potassium: 3.4 mEq/L — ABNORMAL LOW (ref 3.5–5.1)
Potassium: 3.6 mEq/L (ref 3.5–5.1)
Potassium: 3.6 mEq/L (ref 3.5–5.1)
Sodium: 139 mEq/L (ref 135–145)

## 2010-09-30 LAB — HEMOGLOBIN A1C
Hgb A1c MFr Bld: 5.2 % (ref 4.6–6.1)
Mean Plasma Glucose: 103 mg/dL

## 2010-09-30 LAB — PROTIME-INR
INR: 1 (ref 0.00–1.49)
Prothrombin Time: 13.2 seconds (ref 11.6–15.2)

## 2010-09-30 LAB — POCT I-STAT, CHEM 8
Chloride: 107 mEq/L (ref 96–112)
HCT: 43 % (ref 39.0–52.0)
Hemoglobin: 14.6 g/dL (ref 13.0–17.0)
Potassium: 3.3 mEq/L — ABNORMAL LOW (ref 3.5–5.1)
Sodium: 140 mEq/L (ref 135–145)
TCO2: 23 mmol/L (ref 0–100)

## 2010-09-30 LAB — CARDIAC PANEL(CRET KIN+CKTOT+MB+TROPI)
CK, MB: 15.5 ng/mL — ABNORMAL HIGH (ref 0.3–4.0)
Relative Index: 7.9 — ABNORMAL HIGH (ref 0.0–2.5)
Troponin I: 6.96 ng/mL (ref 0.00–0.06)

## 2010-09-30 LAB — TSH: TSH: 2.101 u[IU]/mL (ref 0.350–4.500)

## 2010-09-30 LAB — CK TOTAL AND CKMB (NOT AT ARMC)
CK, MB: 50.1 ng/mL — ABNORMAL HIGH (ref 0.3–4.0)
CK, MB: 62.7 ng/mL — ABNORMAL HIGH (ref 0.3–4.0)
Relative Index: 15.4 — ABNORMAL HIGH (ref 0.0–2.5)

## 2010-09-30 LAB — DIFFERENTIAL
Basophils Relative: 0 % (ref 0–1)
Eosinophils Absolute: 0.1 10*3/uL (ref 0.0–0.7)
Eosinophils Relative: 1 % (ref 0–5)
Monocytes Absolute: 0.6 10*3/uL (ref 0.1–1.0)
Monocytes Relative: 9 % (ref 3–12)

## 2010-09-30 LAB — D-DIMER, QUANTITATIVE: D-Dimer, Quant: 0.25 ug/mL-FEU (ref 0.00–0.48)

## 2010-09-30 LAB — LIPID PANEL
Cholesterol: 201 mg/dL — ABNORMAL HIGH (ref 0–200)
LDL Cholesterol: 151 mg/dL — ABNORMAL HIGH (ref 0–99)
Total CHOL/HDL Ratio: 5.9 RATIO

## 2010-09-30 LAB — BRAIN NATRIURETIC PEPTIDE: Pro B Natriuretic peptide (BNP): 101 pg/mL — ABNORMAL HIGH (ref 0.0–100.0)

## 2010-09-30 LAB — MAGNESIUM: Magnesium: 2.4 mg/dL (ref 1.5–2.5)

## 2010-09-30 LAB — TROPONIN I: Troponin I: 2.78 ng/mL (ref 0.00–0.06)

## 2010-10-26 ENCOUNTER — Other Ambulatory Visit: Payer: Self-pay | Admitting: Internal Medicine

## 2010-10-27 NOTE — Telephone Encounter (Signed)
Hi Willie Lynch,  This is a Dr. Shirlee Latch pt not Graciela Husbands and when I was doing his refill Toprol is not on his ov 06/2010 but is on last yrs note. Pt states in 1/012 note he is not sure why he is off toprol and I do not see that Dr. Shirlee Latch put him back on. Would  you please ask Dr. Shirlee Latch if he wants to put pt back on toprol?   Thanks  Okey Regal

## 2010-10-28 NOTE — Telephone Encounter (Signed)
I talked with pt and he is aware he should not take Coreg and metoprolol.

## 2010-10-28 NOTE — Telephone Encounter (Signed)
Pt was off metoprolol at time of OV with Dr Shirlee Latch 07/09/10.  Dr Shirlee Latch  prescribed Coreg 6.25mg  twice a day at [facility] 07/09/10. I talked with Barbara Cower at [facility] and confirmed pt was getting Coreg. I denied refill for metoprolol and told Barbara Cower pt should not take both Coreg and Metoprolol.

## 2010-10-31 ENCOUNTER — Telehealth: Payer: Self-pay | Admitting: Cardiology

## 2010-10-31 NOTE — Telephone Encounter (Signed)
Pt needs to come off plavix for 5 days for another dr-pls [phone]

## 2010-10-31 NOTE — Telephone Encounter (Signed)
I talked with pt by telephone. Pt is scheduled for spinal injection for back pain by Dr Ethelene Hal next Friday. Pt states Dr Ethelene Hal is requesting pt hold Plavix, Fish Oil, and aspirin for 5 days prior to injection. I will forward to Dr Shirlee Latch for review.

## 2010-10-31 NOTE — Telephone Encounter (Signed)
OK to hold Plavix and fish oil.  Would try to minimize holding of ASA but ok if absolutely necessary.

## 2010-10-31 NOTE — Telephone Encounter (Signed)
I discussed with pt 

## 2010-11-04 NOTE — Cardiovascular Report (Signed)
NAMESHARIEF, WAINWRIGHT               ACCOUNT NO.:  [id number]   MEDICAL RECORD NO.:  [id number]          PATIENT TYPE:  INP   LOCATION:  2922                         FACILITY:  [facility]   PHYSICIAN:  Bruce R. Juanda Chance, MD, FACCDATE OF BIRTH:  [birth year]   DATE OF PROCEDURE:  10/22/2008  DATE OF DISCHARGE:                            CARDIAC CATHETERIZATION   CLINICAL HISTORY:  Mr. Oliff is a 77 year old and is retired from a  pilot life.  He has no prior history of known heart disease although he  does have a history of hypertension.  He is admitted to the hospital  with chest pain and positive enzymes consistent with a non-ST-elevation  myocardial infarction.  He had residual mild pain this morning and was  brought to the lab urgently for intervention.   PROCEDURE:  The procedure was performed via the right femoral artery and  arterial sheath and 5-French preformed coronary catheters.  A front wall  arterial puncture was performed and Omnipaque contrast was used.  After  completion of diagnostic study the decision was to proceed with  intervention on the right coronary artery.  This was heavily calcified.  There were three lesions in the proximal, mid and distal right coronary  artery and the mid lesion was heavily calcified and we elected to use  rotational atherectomy.   The patient was given Angiomax bolus and infusion and was given 600 mg  of Plavix and previously been given four chewable aspirin.  We passed a  [place] floppy, used a JR-4 7-French guiding catheter with side holes.  We  passed the Rota floppy wire down the right coronary artery across the  lesions without difficulty.  We went in with a 1.5 bur and performed  approximately five runs at approximately 160,000 rpm for approximately  10 seconds each.  Temporary pacer was passed via the right femoral vein  prior to rotational atherectomy.  We then removed the first bur and  decided to go in with a 1.75 bur.  We performed three  runs at  approximately 160,000 rpm for 8-10 seconds each.  We then predilated the  distal lesion which we did not rotablate and the mid lesion which we did  rotablate with a 2.5 x 15-mm Apex balloon performing one inflation at  each lesion up to 10 atmospheres for 30 seconds.  We then deployed a 3.0  x 15 mm Xience stent in distal right coronary artery with the distal  edge of the stent just at the edge of the posterior descending branch.  We deployed this with one inflation of 14 atmospheres for about 30  seconds.  We postdilated it with a 3.25 x 12-mm [place] Voyager performing  two inflations up to 16 atmospheres for 30 seconds.  We then deployed a  3.0 x 28 mm Xience stent in the mid vessel crossing a large right  ventricular branch.  We deployed this with one inflation of 14  atmospheres for 30 seconds.  We then postdilated with a 3.25 x 12-mm [place]  Voyager performing three inflations up to 22 atmospheres for 30 seconds.  I forgot to say that we performed an IVUS run after the rotational  atherectomy to help decide about whether the distal lesion need to be  stented to help size the stent in the mid lesion.   Following stenting of the mid lesion, we performed a second IVUS run to  evaluate the mid stent and also to evaluate the lesion just proximal to  the stent which we are more concerned about.  The IVUS run showed good  expansion and no mal-apposition of the mid stent.  However, the lesion  in the proximal vessel was 2 mm or less and the vessel that was 4 mm.  We decided that it needed to be treated.  We directly stented this  lesion with a 3.5 x 50 mm Xience stent overlapping the stent in the  midvessel.  We deployed this with one inflation of 15 atmospheres for 30  seconds.  We then postdilated with a 4.0 x 12-mm [place] Voyager performing  two inflations up to 15 atmospheres for 30 seconds.  Final diagnostic  studies were performed through the guiding catheter.  The patient  tolerated  the procedure well and left the laboratory in satisfactory  condition.   RESULTS:  Left main coronary artery:  Left main coronary artery was  irregular but free of major obstruction.   Left anterior descending artery:  The left anterior descending artery  had a 90% lesion in its proximal portion right at the takeoff of the  septal perforator.  It then gave rise to a second large septal  perforator and a second moderate-sized diagonal branch.  This vessel was  irregular and free of significant obstruction.  There was moderate  calcification at the site of the lesion.   The circumflex artery:  The circumflex artery gave rise to a large  marginal branch.  There was very little AV branch.  The marginal branch  bifurcated in two sub-branches.  These vessels were irregular but free  of major obstruction.   The right coronary artery:  The right coronary artery was a large  dominant vessel and gave rise to a conus branch and two right  ventricular branches, a posterior descending branch and three  posterolateral branches.  There was a 60% stenosis in proximal vessel,  90% long stenosis in the midvessel with calcification and a 70% stenosis  in the distal vessel just before the posterior descending branch.  There  was also 70% narrowing in the midportion of the posterior descending  branch.   The left ventriculogram:  The left ventriculogram performed in the RAO  projection showed good wall motion with no areas of hypokinesis.   Following stenting of the lesion in the distal right coronary artery,  the stenosis improved from 70% to 0%.   Following the tandem lesions in the proximal midportion of the right  coronary artery, the stenosis improved from 60 and 90% to 0% with  overlapping stents.   CONCLUSIONS:  1. Coronary artery disease with recent non-ST-elevation myocardial      infarction with 90% stenosis in the proximal LAD, irregularities in      the circumflex artery, 60% stenosis  in the proximal right, 90%      stenosis in the mid right and 70% stenosis in distal right coronary      artery with 70% stenosis in posterior descending branch of the      right coronary artery and normal LV function.  2. Successful PCI of the lesion in the  distal right coronary artery      using a Xience stent and IVUS guidance with improvement in center      narrowing from 70% to 0%.  3. Successful PCI of tandem lesions in the proximal and mid right      coronary artery using rotational atherectomy and IVUS guidance with      improvement in the center narrowing from 60 and 90% to 0% with      tandem overlapping of Xience stents..   DISPOSITION:  The patient will return to post angio room for further  observation.  The right femoral artery was closed with Angio-Seal.  We  will plan intervention on the LAD tomorrow.      Bruce Elvera Lennox Juanda Chance, MD, [facility]  Electronically Signed     BRB/MEDQ  D:  10/22/2008  T:  10/23/2008  Job:  [id number]   cc:   Lonzo Cloud. Kriste Basque, MD  Jonelle Sidle, MD  Cardiopulmonary Lab

## 2010-11-04 NOTE — Cardiovascular Report (Signed)
NAMENAMEER, SUMMER NO.:  [id number]   MEDICAL RECORD NO.:  [id number]          PATIENT TYPE:  INP   LOCATION:  2922                         FACILITY:  [facility]   PHYSICIAN:  Arturo Morton. Riley Kill, MD, FACCDATE OF BIRTH:  1933-08-13   DATE OF PROCEDURE:  DATE OF DISCHARGE:                            CARDIAC CATHETERIZATION   INDICATIONS:  Mr. Gadbois is a 77 year old gentleman who presented with a  non-ST elevation MI and underwent percutaneous intervention by Dr.  Juanda Chance 1 day earlier.  At that time, he had rotational atherectomy and  stenting of the right coronary artery.  He was then seen by Dr. Juanda Chance  and Dr. Shirlee Latch, and set up for a stage procedure of the left anterior  descending artery.  I reviewed his films and discussed the patient with  Dr. Shirlee Latch, and I also discussed the case with the patient and his  family.  Risks were discussed and benefits, and he was brought to the  lab for further evaluation.   PROCEDURES:  1. Selective coronary arteriography.  2. Intravascular ultrasound.  3. Percutaneous rotational atherectomy of the left anterior descending      artery.  4. Percutaneous stenting of the left anterior descending artery.  5. Insertion of a temporary transvenous pacer.   DESCRIPTION OF PROCEDURE:  The patient was brought to the  catheterization laboratory and prepped and draped in the usual fashion.  Through an anterior puncture, the left femoral artery was entered and a  7-French sheath was placed.  A view of the right coronary artery was  then obtained.  Bivalirudin was given according to protocol.  ACT was  checked and found to be appropriate.  A JL-35 guiding catheter was then  utilized to intubate the left main artery.  Following this, a [place]  wire was placed down the vessel without any difficulty.  Predilatation  was done with a 2-mm balloon.  Following this, we then tried to pass an  intravascular ultrasound catheter down into the LAD  and it would not  pass into the heavily calcified territory.  Based on this, the decision  was made to proceed with percutaneous rotational atherectomy with plaque  modification in order to stent the LAD.  We then used the left femoral  vein, and a 6-French sheath was placed.  A temporary transvenous pacer  was then passed up to the high inferior vena cava, and left off.  We  placed a Rotafloppy wire down the artery.  A 1.5-mm bur was then passed  to the lesion, and appropriate settings created.  We then did 4 passes  using the rotational atherectomy.  There was a moderate amount of  resistance early on, and with gentle technique, the vessel was opened  without difficulty.  There was no evidence of reflow.  Following this,  balloon dilatation was done with a 2.5-mm noncompliant balloon followed  by passage of a 2.5 x 28 mm Xience V drug-eluting stent.  The drug-  eluting stent was then deployed at about 13-14 atmospheres.  There was  marked improvement in the appearance of the artery.  We then were able  to pass a 2.75 [place] Voyager balloon, and post dilatations were done  throughout the stented area up to a size of 13-14 atmospheres as well.  There was marked improvement in the appearance of the artery.  The  guiding catheter and temporary transvenous pacing wire were then  removed.  Femoral sheaths were sewn into place.  He was taken to the  holding area in satisfactory clinical condition.   ANGIOGRAPHIC DATA:  1. The right coronary artery is a heavily calcified, and heavily      stented artery.  The vessel has been stented throughout the      calcified anatomy, and appears to be patent throughout the stented      area.  Distal to the stented area, there are areas of about 30-40%      narrowing leading into a distal area that is stented as well.      There is a 70% lesion of the mid to distal posterior descending      artery, the first posterolateral artery.  Second posterolateral       artery is relatively small and the third is quite large, none of      this critically diseased.  2. On plain fluoroscopy, there is heavy calcification of the proximal      left anterior descending artery.  3. The circumflex is unchanged from the previous study.  It is      segmentally plaqued with diffuse luminal irregularities,      particularly with some hypodensity and calcification prior to the      bifurcation, but without critical narrowing.  The distal vessels      have a lot of diffuse luminal irregularity, but no critical      disease.  4. The left main leading into the LAD is calcified.  There is a high-      grade left anterior descending stenosis of approximately 80%.  It      then is 70% segmentally diseased and again heavily calcified.  The      distal LAD has diffuse 30-40% plaquing throughout.  There is also a      small diagonal branch, this probably has 50-60% ostial narrowing.      Following percutaneous rotational atherectomy and stenting, the LAD      opened up nicely and was reduced to less than 10% residual luminal      narrowing through the heavily calcified area.  There did not appear      to be any evidence of edge tear as previously noted.  The remainder      of the LAD remained unchanged.   CONCLUSION:  Successful percutaneous stenting of the left anterior  descending artery using rotational atherectomy for plaque modification,  and placement of a drug-eluting platform.   DISPOSITION:  The patient will remain on aggressive medical therapy.  Followup will be with Dr. Shirlee Latch.      Arturo Morton. Riley Kill, MD, [facility]  Electronically Signed     TDS/MEDQ  D:  10/23/2008  T:  10/24/2008  Job:  [id number]   cc:   Marca Ancona, MD  Jonelle Sidle, MD  CV Laboratory  Everardo Beals. Juanda Chance, MD, [facility]

## 2010-11-04 NOTE — H&P (Signed)
NAMEJAI, STEIL NO.:  [id number]   MEDICAL RECORD NO.:  [id number]          PATIENT TYPE:  EMS   LOCATION:  MAJO                         FACILITY:  [facility]   PHYSICIAN:  Jonelle Sidle, MD DATE OF BIRTH:  07-08-1933   DATE OF ADMISSION:  10/21/2008  DATE OF DISCHARGE:                              HISTORY & PHYSICAL   PRIMARY CARE PHYSICIAN:  Dr. Alroy Dust.   REASON FOR ADMISSION:  Recent onset chest and left arm discomfort as  well as hypertension.   HISTORY OF PRESENT ILLNESS:  Mr. Bolger is a pleasant 77 year old  gentleman with a history of esophagitis and gastroesophageal reflux  disease that has seemingly been well controlled with intermittent use of  over-the-counter antacids and proton pump inhibitors.  He was been  followed in the past by Dr. Victorino Dike.  He reports no long term  problems with hypertension, diabetes mellitus or cardiovascular disease.  He reports being in his usual state of health until yesterday.  He  attended his granddaughter's dance recital and following this, walked  across the street with some family to have lunch.  Following this and  walking back to the auditorium he began to feel a pressure in his  chest.  He took some antacids that day and ultimately the symptoms  abated.  He had return of these symptoms following dinner and then also  the following day had return of symptoms after coffee in the morning  that would not resolve with antipyretic therapy.  He sought evaluation  at Urgent Care and was noted to be significantly hypertensive with blood  pressures up to 200/100.  He was given some nitroglycerin at that time  and subsequently in the emergency department with improvement in  symptoms.  He continues to have mild chest discomfort.  He states that  he has felt a discomfort in his left antecubital area and upper left arm  associated with this chest pain as well but no breathlessness, nausea or  emesis.  He  states that the symptoms reminded somewhat of reflux but not  entirely and certainly the left arm pain is new for him.  His  electrocardiogram shows sinus bradycardia in the 50s with no obvious  acute ST T-wave changes and his initial cardiac markers are normal with  a troponin I level of less than 0.05, 0.06 and CK-MB levels of 2.6, 7.2.  Chest x-ray reports no active disease process.   ALLERGIES:  CODEINE.   MEDICATIONS AT HOME:  1. Uroxatral 10 mg p.o. daily.  2. Proscar 5 mg daily.  3. Timolol eye drops.  4. Truvada eye drops.  5. Multivitamin once daily.  6. Aspirin 81 mg p.o. daily.  7. P.r.n. Nexium.  8. Prilosec.  9. Pepcid intermittently.   PAST MEDICAL HISTORY:  As outlined above.  He is status post bilateral  laparoscopic inguinal hernia repairs back in 2006 and right shoulder  arthroscopic surgery in 2005 with history of rotator cuff an impingement  syndrome.  He also has benign prostatic hypertrophy and glaucoma.   SOCIAL HISTORY:  The patient is  married, lives in [place].  He is  retired from [employer].  Drinks 2 to 3 alcoholic beverages a week.  No active tobacco abuse.  He does play golf and walks for exercise.  He  also been traveling doing some consulting work.   FAMILY HISTORY:  Reviewed.  The patient's father died at age 77 with  myocardial infarction.  Otherwise noncontributory.   REVIEW OF SYSTEMS:  As outlined above.  No fevers, chills, cough,  hemoptysis, melena, hematochezia, no palpitations or syncope.  Otherwise  reviewed negative.   EXAMINATION:  VITAL SIGNS:  Blood pressure 185/95, respirations 18,  heart rate in the 60s and sinus rhythm, temperature 98.8 degrees of  saturation 99% O2 nasal cannula.  CONSTITUTIONAL:  The patient's well nourished in no acute distress.  HEENT: Conjunctivae is normal.  Pharynx is clear.  NECK:  Supple.  No elevated jugular venous pressure no loud carotid  bruits or thyromegaly.  LUNGS:  Clear without  labored breathing at rest.  CARDIAC:  Exam reveals a regular rate and rhythm.  No pericardial rub or  loud systolic murmur.  PMI is somewhat indistinct.  No S3 gallop.  ABDOMEN:  Soft, nontender.  Bowel sounds are present.  No guarding.  EXTREMITIES:  Exhibit no see the pitting edema.  His pulses are 2+.  SKIN:  Warm and dry.  MUSCULOSKELETAL:  No kyphosis noted.  NEURO/PSYCH:  Patient is alert and oriented x3.  Affect is appropriate.   LABORATORY DATA:  WBCs 6.8, hemoglobin 14.6, hematocrit 43.0, platelets  105,000, INR 1.0, D-dimer 0.25.  Sodium 140, potassium 3.3, chloride  107, bicarb 24, glucose 98, BUN 10, creatinine 0.8, BNP 101.   IMPRESSION:  1. Recent onset chest pressure associated with left arm discomfort,      waxing and waning, and largely postprandial, with onset yesterday.      Symptoms are more prolonged today and do show some responsiveness      to nitroglycerin, more concerning for possible unstable angina.      The patient also has associated significant hypertension which is      apparently new.  Electrocardiogram does not show acute ST changes      and his initial cardiac markers are so far reassuring.  He      continues to have some chest discomfort although it has improved      from presentation.  There is no known history of obstructive      cardiovascular disease or myocardial infarction.  2. Known history of esophagitis and gastroesophageal reflux disease,      previously followed by Dr. Victorino Dike.  Mr. Gornick denies having      any major reflux symptoms with use of p.r.n. antacids.  3. Benign prostatic hypertrophy.  4. Uncertain lipid status.   PLAN:  The patient will be admitted to telemetry for further evaluation.  He is being initiated on intravenous nitroglycerin and heparin in the  emergency department and will continue on aspirin and proton pump  inhibitor therapy.  Beta blocker will not be added at this time given  his low resting heart rate.   Cardiac markers will be cycled and he will  be observed clinically with followup electrocardiogram in the morning as  well as fasting lipid profile.  His symptoms are concerning for possible  unstable angina given their description, although certainly his history  is  complicated by a known prior problem with erosive esophagitis and  reflux.  He will clearly need followup  ischemic evaluation, potentially  even cardiac catheterization as an inpatient depending on how he does  during observation and how his cardiac markers trend.  Further plans to  follow.      Jonelle Sidle, MD  Electronically Signed     SGM/MEDQ  D:  10/21/2008  T:  10/21/2008  Job:  [id number]   cc:   Lonzo Cloud. Kriste Basque, MD

## 2010-11-04 NOTE — Discharge Summary (Signed)
NAMEKAYIN, OSMENT NO.:  [id number]   MEDICAL RECORD NO.:  [id number]          PATIENT TYPE:  INP   LOCATION:  2922                         FACILITY:  [facility]   PHYSICIAN:  Marca Ancona, MD      DATE OF BIRTH:  04-17-1934   DATE OF ADMISSION:  10/21/2008  DATE OF DISCHARGE:  10/24/2008                               DISCHARGE SUMMARY   DISCHARGE DIAGNOSES:  1. Coronary artery disease/non-ST-elevated myocardial infarction      status post percutaneous coronary intervention to the right      coronary artery and left anterior descending with drug-eluting      stents.  2. [place] registry study.  3. Hypertension.  4. Hyperlipidemia.  5. Thrombocytopenia.  6. Gastroesophageal reflux disease.   Mr. Wanninger is a 77 year old Caucasian gentleman with a history of  esophagitis and GERD and no known history of coronary artery disease,  who experienced some chest discomfort after eating lunch on day of  admission, took some antacids, and ultimately the symptoms abated;  however, the symptoms returned following dinner.  The patient sought  evaluation at urgent care, was noted to be significantly hypertensive  with a blood pressure 200/100.  He was given nitroglycerin and  transferred to the emergency room for further evaluation.  In the ER,  EKG showed sinus brady in the 50s with no acute obvious ST-T wave  changes.  His initial point of cares were negative.  The patient was  admitted for further evaluation as his symptoms were somewhat concerning  for unstable angina.  The patient had return of intermittent nausea,  treated with Zofran, and cardiac enzymes ultimately increased with a  troponin of 1.2, CK-MB of 50.  The patient continued to have worrisome  chest discomfort and was taken to the cardiac catheterization lab by Dr.  Charlies Constable on Oct 22, 2008, for placements of XIENCE drug-eluting  stents x3 to the distal RCA and proximal to mid.  EF was found to be  55%.   The patient returned to Coronary Care, was gently hydrated.  Plavix therapy on board and then the patient returned to the cath lab on  Oct 23, 2008, for PTCA/PCI to the LAD with XIENCE drug-eluting stent.  The patient tolerated the procedure without complications.  Cardiac  Rehab worked with the patient.  Once stable and bed rest completed, Dr.  Shirlee Latch in to see the patient on Oct 24, 2008.  The patient doing well.  Blood pressure much improved at 115/63, heart rate in the 70s,  tolerating medications without problems, potassium 3.6, creatinine 0.7,  hematocrit 34.6.  The patient being discharged home to follow up with  Dr. Shirlee Latch on Nov 12, 2008, at 9:45.   MEDICATIONS AT TIME OF DISCHARGE:  The patient may resume his previous  medicines including Uroxatral, Proscar, Nexium, timolol, Travatan eye  drops, and multivitamin.  New medications will include:  1. Aspirin 325 mg.  2. Plavix 75 mg.  3. Lipitor 80 mg.  4. Toprol-XL 50 mg.  5. Lisinopril 5 mg.  6. Nitroglycerin as needed.   The patient  was given the postcardiac catheterization discharge  instructions.   DURATION OF DISCHARGE ENCOUNTER:  Over 30 minutes.      Dorian Pod, ACNP      Marca Ancona, MD  Electronically Signed    MB/MEDQ  D:  10/24/2008  T:  10/25/2008  Job:  [id number]

## 2010-11-05 ENCOUNTER — Telehealth: Payer: Self-pay | Admitting: Cardiology

## 2010-11-05 NOTE — Telephone Encounter (Signed)
Dr Shirlee Latch recommended pt try Magnesium Oxide 400mg  daily for restless leg syndrome.  Pt states he tried this in the past and was unable to take it. Pt will contact Dr Kriste Basque about any further recommendations for restless leg syndrome.

## 2010-11-05 NOTE — Telephone Encounter (Signed)
Pt has questions regarding his medications  °

## 2010-11-05 NOTE — Telephone Encounter (Signed)
I talked with pt by telephone. Pt is asking if Dr Shirlee Latch has a recommendation for a medication he could take for restless leg syndrome, either prescription or OTC. I will review with Dr Shirlee Latch.

## 2010-11-05 NOTE — Telephone Encounter (Signed)
NA

## 2010-11-06 ENCOUNTER — Telehealth: Payer: Self-pay | Admitting: Pulmonary Disease

## 2010-11-06 NOTE — Telephone Encounter (Signed)
Spoke with pt and explained to pt that SN has not seen him since 08/2007 and he will need ov for any rx.  Pt was very upset about this and stated that it was bull and he knew that this was not true.  Pt very rude during phone conversation and scheduled appt to see SN on 6-19 at 2:30

## 2010-11-06 NOTE — Telephone Encounter (Signed)
lmomtcb for pt--last ov with SN was 08/2007.  Pt will need ov before we can give any rx.

## 2010-11-07 NOTE — Assessment & Plan Note (Signed)
[facility]                                 ON-CALL NOTE   GARYSON, STELLY                      MRN:          [id number]  DATE:11/15/2006                            DOB:          12/27/1933    TELEPHONE CONSULTATION   Phone # [phone].  Time and date of call:  7:06 a.m., Nov 15, 2006.   The patient calls stating that he has had a bad sore throat for one to  two days.  He has had no fever.  He has had some generalized malaise.  He has a stuffy nose.  He has had some cough that has been  nonproductive.  The patient states that he has taken over-the-counter  Chloraseptic spray and Tylenol Cold and Sinus.  The patient states that  he needs some help today.  He is in no acute distress by his voice  which is very strong, and he denies any shortness of breath.  The  patient had the symptoms discussed, and it appears to be a viral  syndrome.  I discussed with the patient pushing fluids, switching to  Advil Cold and Sinus, which he is not allergic to and calling the office  in the morning for evaluation.  The patient was not satisfied with this  stating that he needed help today.  Therefore, I referred him to the  emergency room.   Note is made that the communication was very poor given that the phone  connection was very poor.  I could barely hear the patient towards the  end of the conversation.  I made the patient aware of this but he  continued to speak and would not correct his phone.  I did state to him  that if he felt that he needed to be seen, he had to come to the  emergency room, thus, I did not feel antibiotics were indicated at this  point.  The patient was referred to the emergency room and appeared to  understand the instructions.  Again, the phone communication was very  poor.   IMPRESSION:  Viral syndrome.   PLAN:  Instructions were given to the patient as above.  The patient,  however, appears that he will go to the emergency  room.     Gailen Shelter, MD  Electronically Signed    CLG/MedQ  DD: 11/15/2006  DT: 11/15/2006  Job #: [id number]   cc:   Lonzo Cloud. Kriste Basque, MD

## 2010-11-07 NOTE — Op Note (Signed)
NAMEKALAI, BACA               ACCOUNT NO.:  [id number]   MEDICAL RECORD NO.:  [id number]          PATIENT TYPE:  AMB   LOCATION:  DAY                          FACILITY:  [facility]   PHYSICIAN:  Thornton Park. Daphine Deutscher, MD  DATE OF BIRTH:  05-19-1934   DATE OF PROCEDURE:  12/25/2004  DATE OF DISCHARGE:                                 OPERATIVE REPORT   PREOPERATIVE DIAGNOSIS:  Bilateral inguinal hernias.   POSTOPERATIVE DIAGNOSIS:  Bilateral indirect inguinal hernias.   PROCEDURE:  Laparoscopic bilateral inguinal hernia repair with Davol mesh.   SURGEON:  Thornton Park. Daphine Deutscher, MD   ANESTHESIA:  General endotracheal.   DESCRIPTION OF PROCEDURE:  Mr. Wuebker was taken back to room 11 on December 25, 2004 and given general anesthesia. He had PAS hose in place and received  Ancef preop. The abdomen was prepped with Betadine and draped sterilely. A  transverse incision was made slightly to the right of the umbilicus  inferiorly and the rectus sheath was cut transversely, the muscle retracted  laterally and with a finger I introduced into the rectus sheath and then  introduced the balloon dissector down to the pubis. This was deployed using  the zero degree scope in place to observe it and I got a reasonably good  dissection plane established. I then inserted the balloon tip trocar and  insufflated the preperitoneal space. He had a lot of fat and because he is  taking aspirin he did ooze a bit obscuring the anatomy somewhat. I focused  on the right side first. I first put in two 5 mm trocars using needles as a  guide, and once those were in place, I dissected out the right cord  structures and found a larger fairly stuck indirect sac which I stripped  back and put down well back into the abdomen. It also appeared that he had  some weakness in the floor and there were  two other direct type defects in  the floor that were also visualized.   I dissected the left side as well and found a smaller but  yet indirect  hernia on the left side and no obvious direct hernia on the left side. I  used a piece of Davol mesh and placed that on the right side first tacking  it medially along the pubis and then anteriorly and then laterally staying  where I could always feel the tacker. On the left side, I put the same kind  of precut left side piece and then tacked it medially along the inguinal  ligament, and then anteriorly and then laterally. Good coverage of the floor  was present and there was no bleeding noted.   I then deflated the preperitoneal space, the mesh seemed to lie nicely. The  rectus fascial defect was closed with simple 2-0 Vicryl suture. 4-0 Vicryl  was used on the skin following Benzoin and Steri-Strips. The patient  tolerated the procedure well and was taken to the recovery room in  satisfactory condition. He will be kept for overnight observation.       MBM/MEDQ  D:  12/25/2004  T:  12/25/2004  Job:  [id number]   cc:   Lonzo Cloud. Kriste Basque, M.D. [facility]

## 2010-11-07 NOTE — Op Note (Signed)
NAME:  Willie Lynch, Willie Lynch NO.:  [id number]   MEDICAL RECORD NO.:  [id number]                   PATIENT TYPE:  OBV   LOCATION:  7829                                 FACILITY:  [facility]   PHYSICIAN:  Erasmo Leventhal, M.D.         DATE OF BIRTH:  11/12/1933   DATE OF PROCEDURE:  DATE OF DISCHARGE:                                 OPERATIVE REPORT   PREOPERATIVE DIAGNOSES:  Right shoulder intraarticular loose bodies,  symptomatic. Possible rotator cuff tear.   POSTOPERATIVE DIAGNOSES:  Right shoulder intraarticular symptomatic loose  bodies, glenohumeral osteoarthritis, subacromial adhesions.   PROCEDURE:  Right shoulder glenohumeral arthroscopy with removal of  intraarticular loose bodies, chondroplasty of the glenohumeral joint.  Subacromial debridement. Stress radiography with C-arm.   SURGEON:  Erasmo Leventhal, M.D.   ASSISTANT:  Jaquelyn Bitter. Chabon, P.A.   ANESTHESIA:  Interscalene block, general.   ESTIMATED BLOOD LOSS:  Less than 10 mL.   DRAINS:  None.   COMPLICATIONS:  None.   DISPOSITION:  To PACU stable.   SPECIMEN:  The major fragment of the loose body was given to the patient.   DESCRIPTION OF PROCEDURE:  The patient and family counseled before surgery  and taken to the OR. Interscalene block will be administered in the holding  area. Taken to the operating room, placed in the supine position under  general anesthesia then placed a modified beach chair position. The right  shoulder was then prepped with Duraprep and all draped in a sterile fashion.  A posterior portal was created, arthroscope placed in the glenohumeral  joint. Diagnostic arthroscopy unfortunately revealed advanced grade 4 on the  humeral head and grade 2-3 in the glenoid. The major portion of the metallic  anchor could be seen at the axillary pouch. There was quite a bit of debris  in the joint, fibrinous exudate and chondral fragments. An accessory  anterior  port was made just over the rolled edge of the subscapularis to the  rotator cuff interval. At this time, motorized shaver was introduced  __________ and the debris was chondral fragments was removed. Chondroplasty  was performed on the humeral head and glenoid at this time.   At this time, I spent quite some time trying to remove the Mitek anchor.  After quite a bit of difficulty, it was grasped with a grasper and pulled  out to the anterior portal.  One of these small limbs could be seen at 11  o'clock underneath the labrum and against the chondral area and it was  removed.  There was one final limb that we had known that had been  stationary anteriorly. I thoroughly looked through the joint, superior,  inferior, anterior, posterior, switched the camera from front to back and  etc. and did not see anymore loose fragments in the joint. The rotator cuff  was inspected also and found to be intact although there was some cuff  tendinopathy  present.  The shoulder joint was then copiously irrigated and  arthroscope was removed.   The scope was placed in the subacromial region, anterolateral portal was  created and subacromial bursectomy and debridement of adhesions performed.  The rotator cuff inspected and bursal surface and found to be intact. Again  there was cuff tendinopathy present but no recurrent tearing. Irrigated and  arthroscopic equipment was removed. The portal was closed with 4-0 nylon  suture and after this, I then used the C-arm and confirmed that the metallic  anchor in the humeral head had remained in place. The large Mitek loose  piece was removed as was one of the limbs. The previous anterior limb which  was noted to have been station the entire time remained unchanged from  previous x-rays.  I deduced at this time that this was imbedded in the soft  tissue anteriorly and was not floating free in the joint as it had been  stable on multiple x-rays. Dry, sterile compressive  dressing applied, the  patient in sling and then gently awakened. He was taken from the operating  room to PACU in stable condition. Plan with be stabilization in PACU and  overnight stay.   To decrease surgical time and help with this procedure, Mr. Jeannett Senior Chabon's  assistance was needed.                                               Erasmo Leventhal, M.D.    RAC/MEDQ  D:  02/14/2004  T:  02/15/2004  Job:  [id number]

## 2010-11-07 NOTE — Consult Note (Signed)
   NAME:  Willie Lynch, Willie Lynch                         ACCOUNT NO.:  [id number]   MEDICAL RECORD NO.:  [id number]                   PATIENT TYPE:  INP   LOCATION:  0479                                 FACILITY:  [facility]   PHYSICIAN:  Maretta Bees. Vonita Moss, M.D.             DATE OF BIRTH:  Feb 15, 1934   DATE OF CONSULTATION:  04/13/2002  DATE OF DISCHARGE:                                   CONSULTATION   HISTORY OF PRESENT ILLNESS:  I was asked to see this 77 year old gentleman  because of voiding troubles postoperative rotator cuff surgery yesterday.  He has voided small amounts and has been catheterized a couple of times and  now had a Foley catheter placed because of a residual urine of 900 or more  cc.  He is well known to me with a history of BPH and a large prostate and  elevated PSA levels in the past.  He has had two biopsies of the prostate  which have been benign, and he has been on Proscar 5 mg a day since July  2001.  Lately he has been voiding satisfactorily until he had his surgery.   PHYSICAL EXAMINATION:  EXTREMITIES:  Sling on his right shoulder.  ABDOMEN:  Soft and nontender.  GENITOURINARY:  Penis, urethra, meatus, scrotum, testicles, and epididymis  unremarkable except for a Foley catheter in place draining clear urine.   IMPRESSION:  Benign prostatic hypertrophy and prostatism with acute urinary  retention.   PLAN:  I would leave the Foley in for now and send him home with a leg bag  and have him remove the Foley at home on Monday morning, April 17, 2002.  Would continue on Proscar.  I have started him on Flomax and gave him  samples to take home and utilize at bedtime.  if he cannot void after his  Foley comes out on Monday, I will see him that day in the office but, if he  voids okay, I will see him later in the week.                                                Maretta Bees. Vonita Moss, M.D.    LJP/MEDQ  D:  04/13/2002  T:  04/13/2002  Job:  [id number]   cc:   Marlowe Kays, MD  [address]  [place]  [place] [address]  Fax: [phone]

## 2010-11-07 NOTE — Op Note (Signed)
NAME:  Willie Lynch, Willie Lynch                         ACCOUNT NO.:  [id number]   MEDICAL RECORD NO.:  [id number]                   PATIENT TYPE:  AMB   LOCATION:  DAY                                  FACILITY:  [facility]   PHYSICIAN:  Marlowe Kays, MD                 DATE OF BIRTH:  [birth year]   DATE OF PROCEDURE:  04/11/2002  DATE OF DISCHARGE:                                 OPERATIVE REPORT   PREOPERATIVE DIAGNOSIS:  Torn rotator cuff, right shoulder.   POSTOPERATIVE DIAGNOSIS:  Torn rotator cuff, right shoulder.   OPERATION:  1. Anterior acromionectomy.  2. Repair of torn rotator cuff, right shoulder.   SURGEON:  Illene Labrador. Aplington, M.D.   ASSISTANT:  Clarene Reamer, PA-C   ANESTHESIA:  General.   PATHOLOGY AND JUSTIFICATION FOR PROCEDURE:  Painful right shoulder with MRI  showing a large retracted tear full-thickness tear.  At surgery, he had a  tear that measured roughly 1.25-1.5 cm in width and about 1.5-2 cm of  retraction.  Biceps tendon was intact.  The underneath surface of the [facility]  joint was smooth to palpation.  On x-ray, there appeared to be a little spur  there, determined it was arthritic but not tender, and it was left intact.   DESCRIPTION OF PROCEDURE:  Prophylactic antibiotics, satisfactory general  anesthesia, beach chair position on the Schlein frame.  The right shoulder  girdle was prepped with DuraPrep and draped in a sterile field.  Ioban  employed.  Vertical incision from roughly the [facility] joint down to the greater  tuberosity.  The fascia over the anterior acromion was opened in line with  the skin incision and retracted anteriorly and posteriorly.  The [facility] joint  was identified with the [facility] needle.  The underneath surface of the  anterior acromion was undermined with the Cobb elevator and protecting it,  my initial anterior acromionectomy was performed with microsaw, and I  smoothed down additional underneath surface of bone with the saw as well.  A  partial bursectomy performed.  The rotator cuff tear described above was  identified.  It was very flexible, and I was able to get it back to its  original position passively.  Accordingly, I roughened down the level of the  greater tuberosity and used two super Mitek anchors, putting the rotator  cuff laterally and then used the ends of the two original sutures to tack  down the distal portion of the rotator cuff anteriorly and posteriorly and  then tied the two together, giving a nice, solid, stable repair.  There was  no residual impingement at the conclusion of the case.  The wound was then  irrigated with sterile saline.  He had had a preoperative intrascalene  block, and no Marcaine was used at this time.  The deltoid muscle was closed  in two layers with interrupted #1 Vicryl, and the fascia over the  anterior  acromion was reapproximated with the same with a nice, stable repair.  The  subcutaneous tissue was then closed with 2-0 Vicryl and skin with Steri-  Strips.  Dry sterile dressing, shoulder immobilizer were applied, tolerated  the procedure well and at the time of this dictation, was on his to the  recovery room in satisfactory condition with no known complications.                                              Marlowe Kays, MD   JA/MEDQ  D:  04/11/2002  T:  04/11/2002  Job:  [id number]

## 2010-11-07 NOTE — Discharge Summary (Signed)
NAME:  Willie Lynch, Willie Lynch                         ACCOUNT NO.:  [id number]   MEDICAL RECORD NO.:  [id number]                   PATIENT TYPE:  INP   LOCATION:  0479                                 FACILITY:  [facility]   PHYSICIAN:  Marlowe Kays, MD                 DATE OF BIRTH:  10-11-1933   DATE OF ADMISSION:  04/11/2002  DATE OF DISCHARGE:  04/14/2002                                 DISCHARGE SUMMARY   ADMISSION DIAGNOSES:  1. Torn rotator cuff right shoulder.  2. Impingement syndrome.  3. Gastroesophageal reflux disease.  4. Benign prostatic hypertrophy.   DISCHARGE DIAGNOSES:  1. Torn rotator cuff right shoulder status post right rotator cuff repair.  2. Impingement syndrome.  3. Gastroesophageal reflux disease.  4. Benign prostatic hypertrophy.   PROCEDURE:  The patient was taken to the operating room on April 11, 2002  and underwent a right rotator cuff repair.   SURGEON:  Marlowe Kays, M.D.   ASSISTANT:  Clarene Reamer, P.A.-C.   ANESTHESIA:  The surgery was done under general anesthesia.   CONSULTATIONS:  1. Physical therapy.  2. Occupational therapy.  3. Urology, Maretta Bees. Vonita Moss, M.D.   BRIEF HISTORY:  A 77 year old gentleman with impingement syndrome and right  rotator cuff tear.  Conservative treatment had failed to this point.  Risks  and benefits of the procedure were discussed with the patient and the  patient wished to proceed with rotator cuff repair.  Routine chemistry on  admission showed glucose slightly high at 112.  Hemoglobin and hematocrit on  admission were all within normal limits at 15.1 and 43.8.  EKG on admission  showed sinus bradycardia with an otherwise normal EKG.  MRI of the shoulder  revealed a large full-thickness tear supraspinatus tendon extending  approximately 2 cm right to left with retraction of the musculotendinous  junction and associated tendinopathy in intraspinatus and biceps, and a  large joint effusion with extensive  fluid in the subacromial and deltoid  bursa.   HOSPITAL COURSE:  The patient was admitted to [facility] and taken  to the operating room.  He underwent the above-stated procedure without  complication and returned to the recovery room and then the orthopedic floor  in continued postoperative care. On postoperative day #1 was seen by  orthopedics when he complained of nausea most likely from meds, was having  trouble voiding.  It was then decided he would stay another day.  On April 13, 2002, postop day #2, the patient still complaining of nausea and  vomiting, also was still unable to void which had become worse from the  previous day.  Dr. Vonita Moss was called for a consult.  On April 14, 2002,  was seen by orthopedics, the patient was resting comfortably.  No nausea,  vomiting, had a Foley leg bag placed.   DISPOSITION:  The patient is discharged home on April 14, 2002.   DISCHARGE MEDICATIONS:  1. Talwin NX, dispense #40, one to two p.o. q.4-6 h. p.r.n. pain.  2. Robaxin 500 mg, dispense #40, one p.o. q.8 h. p.r.n.  3. Phenergan 25 mg, dispense #20, one p.o. q.6 h. p.r.n.  4. Proscar samples given by Dr. Vonita Moss.   DIET:  As tolerated.   ACTIVITY:  No range of motion to the right shoulder, elbow flexion and  extension only.  Sling at all times.   FOLLOW UP:  The patient is to follow up with Marlowe Kays, MD 10-14 days  from surgery.  He is to call the office for an appointment.  He is to also  go home with a leg bag which is to be removed on Monday and he is to follow  up with Dr. Vonita Moss next week.   CONDITION ON DISCHARGE:  Stable, improved.     Clarene Reamer, P.A.-C.                   Marlowe Kays, MD    SW/MEDQ  D:  04/14/2002  T:  04/14/2002  Job:  [id number]

## 2010-11-07 NOTE — Assessment & Plan Note (Signed)
[facility]                             PULMONARY OFFICE NOTE   NAME:Willie Lynch                      MRN:          [id number]  DATE:08/30/2006                            DOB:          [birth year]    HISTORY OF PRESENT ILLNESS:  The patient is a 77 year old white male  patient of Dr. Jodelle Green who has a known history of allergic rhinitis,  irritable bowel syndrome, and osteoarthritis, presents for an acute  office visit.  The patient presents today complaining that he has had  hives over the last 3 days.  The patient was seen in the office 10 days  ago for an acute sinusitis, was started on Augmentin and a Medrol  Dosepak.  The patient reports he took most of the Augmentin, however  discontinued one day early due to nausea.  Subsequently the day after  finishing Augmentin he started having some itching along his feet, and  subsequently the next day he started breaking out in a generalized rash  along his arms, trunk, and lower extremities.  The patient was seen at  urgent care on August 28, 2006 for an allergic reaction.  He was given  Zantac 150 mg daily along with Zyrtec and a prednisone taper.  The  patient reports his hives have improved and pruritus has decreased.  He  denies any chest pain, shortness of breath, wheezing, tongue swelling,  or difficulty swallowing.   PAST MEDICAL HISTORY:  Reviewed.   CURRENT MEDICATIONS:  Reviewed.   PHYSICAL EXAMINATION:  The patient is a pleasant male in no acute  distress.  He is afebrile with stable vital signs.  O2 saturation is 97%  on room air.Willie Lynch  HEENT:  Posterior pharynx is clear without any exudate or swelling.Willie Lynch  NECK:  Supple without cervical adenopathy.  No JVD.  LUNGS:  Sounds are clear.  CARDIAC:  Regular rate.  ABDOMEN:  Soft and nontender.  EXTREMITIES:  Warm without any calf tenderness, cyanosis, clubbing, or  edema.  SKIN:  Reveals a fine maculopapular rash along the trunk, upper, and  lower extremities.   IMPRESSION AND PLAN:  Urticarial reaction, possibly secondary to recent  antibiotic use, Augmentin.  The patient is to finish prednisone, Zyrtec,  and Ranitidine as recommended.  The  patient is to use cool baths.  Have offered Atarax for itching, however  the patient declined.  The patient is to return with Dr. Kriste Basque as  scheduled or sooner if needed.      Willie [place]aks, NP  Electronically Signed      Lonzo Cloud. Kriste Basque, MD  Electronically Signed   TP/MedQ  DD: 08/30/2006  DT: 08/30/2006  Job #: [id number]

## 2010-11-07 NOTE — Op Note (Signed)
[name], Willie Lynch               ACCOUNT NO.:  [id number]   MEDICAL RECORD NO.:  [id number]          PATIENT TYPE:  AMB   LOCATION:  DAY                          FACILITY:  [facility]   PHYSICIAN:  Thornton Park. Daphine Deutscher, MD  DATE OF BIRTH:  [birth year]   DATE OF PROCEDURE:  DATE OF DISCHARGE:                                 OPERATIVE REPORT   PREOPERATIVE DIAGNOSIS:  Bilateral inguinal hernias.   POSTOPERATIVE DIAGNOSIS:  Bilateral indirect inguinal hernias.   PROCEDURE:  Laparoscopic bilateral inguinal hernia repairs with mesh using  Davol precut patches.   Dictation ended at this point.       MBM/MEDQ  D:  12/25/2004  T:  12/25/2004  Job:  [id number]   cc:   Lonzo Cloud. Kriste Basque, M.D. [facility]

## 2010-11-12 ENCOUNTER — Telehealth: Payer: Self-pay | Admitting: Cardiology

## 2010-11-12 NOTE — Telephone Encounter (Signed)
I talked with pt's wife. Pt had spinal injection 11/07/10 by Dr Ethelene Hal. Pt held Plavix, aspirin, fish oil  , and vitamin E for 5 days prior to injection. Pt needs to have another spinal injection 11/21/10. Dr Ethelene Hal is requesting pt hold Plavix, fish oil, vitamin  E and aspirin again  for 5 days. I will forward to Dr Shirlee Latch for review.

## 2010-11-12 NOTE — Telephone Encounter (Signed)
Wife called and has a question about some medication.  He needs an injection in his back and needs to be off some of his meds.  Please call her back about d/c medication Plavix for 5 days prior to injection.

## 2010-11-12 NOTE — Telephone Encounter (Signed)
He can hold Plavix for 6/1 procedure 5 days in advance also.

## 2010-11-13 NOTE — Telephone Encounter (Signed)
LMTCB

## 2010-11-14 ENCOUNTER — Other Ambulatory Visit: Payer: Self-pay | Admitting: Cardiology

## 2010-11-14 NOTE — Telephone Encounter (Signed)
I spoke with the pt's wife and made her aware that Dr Shirlee Latch documented that the pt can hold Plavix 5 days prior to procedure.  The pt's wife wanted to know if the pt can hold ASA (this was not mentioned by Dr Shirlee Latch in his note).  The pt's wife said that the pt had an injection just a few weeks ago and at that time the pt held the Plavix and ASA.  I looked back in the pt's chart and per telephone note:  Marca Ancona, MD 10/31/2010 11:18 AM Signed  OK to hold Plavix and fish oil. Would try to minimize holding of ASA but ok if absolutely necessary  I made the pt's wife aware of this information and she is going to stop the pt's ASA.  I did make her aware of the risk involved with with holding both plavix and ASA at the same time.  She said that the pt is in a lot of pain and really needs injection.

## 2010-11-14 NOTE — Telephone Encounter (Signed)
Per pt wife calling , aware that Thurston Hole  is off today. Need answer before Today.

## 2010-11-18 ENCOUNTER — Telehealth: Payer: Self-pay | Admitting: Cardiology

## 2010-11-18 NOTE — Telephone Encounter (Signed)
I have reviewed the patient holding ASA for his back injection with Dr. Shirlee Latch. Per Dr. Shirlee Latch, he would prefer the patient not come off ASA, but if he absolutely must, then he prefers the patient be off the minimal amount of time that he possibly can. The patient wife is aware and verbalizes understanding. She has a call in to Dr. Ethelene Hal' office about this.

## 2010-11-18 NOTE — Telephone Encounter (Signed)
I called and spoke with the patient's wife. I have made her aware that it has not been clarified for the second injection if Dr. Shirlee Latch will give the ok for the patient to hold his ASA this time. I will clarify this with Dr. Shirlee Latch and call her back.

## 2010-11-18 NOTE — Telephone Encounter (Signed)
Pt needs to be off ASA in order to get shot he needs and this was not medntioned at last conversation with a nurse so she really needs to know if he can come off asa today

## 2010-11-22 ENCOUNTER — Other Ambulatory Visit: Payer: Self-pay | Admitting: Internal Medicine

## 2010-11-26 ENCOUNTER — Encounter: Payer: Self-pay | Admitting: Cardiology

## 2010-12-04 ENCOUNTER — Encounter: Payer: Self-pay | Admitting: Pulmonary Disease

## 2010-12-09 ENCOUNTER — Ambulatory Visit (INDEPENDENT_AMBULATORY_CARE_PROVIDER_SITE_OTHER): Payer: Medicare Other | Admitting: Pulmonary Disease

## 2010-12-09 ENCOUNTER — Other Ambulatory Visit (INDEPENDENT_AMBULATORY_CARE_PROVIDER_SITE_OTHER): Payer: Medicare Other

## 2010-12-09 ENCOUNTER — Encounter: Payer: Self-pay | Admitting: Pulmonary Disease

## 2010-12-09 DIAGNOSIS — R5383 Other fatigue: Secondary | ICD-10-CM

## 2010-12-09 DIAGNOSIS — I10 Essential (primary) hypertension: Secondary | ICD-10-CM

## 2010-12-09 DIAGNOSIS — M545 Low back pain, unspecified: Secondary | ICD-10-CM

## 2010-12-09 DIAGNOSIS — I2510 Atherosclerotic heart disease of native coronary artery without angina pectoris: Secondary | ICD-10-CM

## 2010-12-09 DIAGNOSIS — R0789 Other chest pain: Secondary | ICD-10-CM

## 2010-12-09 DIAGNOSIS — E7800 Pure hypercholesterolemia, unspecified: Secondary | ICD-10-CM

## 2010-12-09 DIAGNOSIS — R079 Chest pain, unspecified: Secondary | ICD-10-CM | POA: Insufficient documentation

## 2010-12-09 DIAGNOSIS — R5381 Other malaise: Secondary | ICD-10-CM

## 2010-12-09 DIAGNOSIS — M1990 Unspecified osteoarthritis, unspecified site: Secondary | ICD-10-CM

## 2010-12-09 DIAGNOSIS — R0602 Shortness of breath: Secondary | ICD-10-CM

## 2010-12-09 DIAGNOSIS — I2589 Other forms of chronic ischemic heart disease: Secondary | ICD-10-CM

## 2010-12-09 LAB — CBC WITH DIFFERENTIAL/PLATELET
Basophils Relative: 0.4 % (ref 0.0–3.0)
Eosinophils Absolute: 0.1 10*3/uL (ref 0.0–0.7)
Hemoglobin: 14.7 g/dL (ref 13.0–17.0)
MCHC: 34.8 g/dL (ref 30.0–36.0)
MCV: 92.7 fl (ref 78.0–100.0)
Monocytes Absolute: 0.7 10*3/uL (ref 0.1–1.0)
Neutro Abs: 3.2 10*3/uL (ref 1.4–7.7)
RBC: 4.57 Mil/uL (ref 4.22–5.81)

## 2010-12-09 LAB — BASIC METABOLIC PANEL
Calcium: 9.3 mg/dL (ref 8.4–10.5)
Chloride: 107 mEq/L (ref 96–112)
Creatinine, Ser: 0.8 mg/dL (ref 0.4–1.5)

## 2010-12-09 LAB — HEPATIC FUNCTION PANEL
Bilirubin, Direct: 0.3 mg/dL (ref 0.0–0.3)
Total Bilirubin: 1.8 mg/dL — ABNORMAL HIGH (ref 0.3–1.2)

## 2010-12-09 MED ORDER — PRAMIPEXOLE DIHYDROCHLORIDE 0.25 MG PO TABS: ORAL_TABLET | ORAL | Status: DC

## 2010-12-09 NOTE — Patient Instructions (Addendum)
Today we updated your med list in EPIC...   I rec that you increase your PEPCID to twice daily for now...    For GAS> try SIMETHACONE OTC (Mylicon, Phazyme, etc) as needed...  Today we did your follow up blood work due to your increased fatigue>    Please call the PHONE TREE in a few days for your results...    Dial [id number] & when prompted enter your patient number followed by the # symbol...    Your patient number is:  [id number]  Call for any questions...  We are trying to arrange a follow up visit w/ DrMcLean ASAP.Marland KitchenMarland Kitchen

## 2010-12-09 NOTE — Progress Notes (Signed)
Subjective:    Patient ID: Willie Lynch, male    DOB: 03/18/1934, 77 y.o.   MRN: [id number]  HPI 77 y/o WM whom I last saw in 2009 for a routine follow up... He has a hx CAD- NSTEMI 5/10 w/ PCI to LAD & RCA, & followed by DrMcLean on ASA, Plavix, Losartan; and Hyperchol on Cres40...  ~  December 09, 2010:  >66yr ROV & there is a lot to get caught up on- SEE PROB LIST BELOW for details... His CC is RLS & he wants Mirapex Rx but DrMcLean wouldn't write it so he called here for a prescription but hadn't been seen in >78yrs...  He reports prob w/ LBP- eval by DrOlin w/ shots in his back from [facility] & improved...  He is c/o no energy/fatigue & some incr in SOB ever since returning from a trip to [facility] (he has been forced to decr his exercise program)> we discussed the need for Cards f/u in light of his cardiomyopathy, CAD, & normal f/u labs (Labs today w/ normal chems, cbc, tsh, etc)...  Also notes stomach "queezy"/indigestion & he has some gas> we discussed increasing Pepcid to Bid & adding Mylicon for the gas...   Problem List:   GLAUCOMA>  On eye drops from DrMcCuen...  ALLERGIC RHINITIS (ICD-477.9) - on ZYRTEK 10mg  prn, Benedryl prn...  Hx HBP w/ ACE cough>  On LOSARTAN 50mg /d & COREG 6.25mg  Bid...  BP= 120/68 & he denies CP or palpit, but as noted is c/o no energy/ feeling poorly/ "indigestion" etc. ~  Last CXR 5/10 showed tort thor Ao, clear lungs, post op changes right shoulder  CAD/ NSTEMI 5/10 w/ PCI LAD & RCA>  Followed regularly by DrMcLean for Cards on ASA 81mg /d, PLAVIX 75mg /d, plus the Coreg, Losartan, Crestor, NTG prn. Ischemic Cardiomyopathy w/ diffuse HK on Echo 8/10 & EF= 45-50%, mild AI/ MR... ~  Hosp 5/10 by DrMcLean/ Cards> CAD & NSTEMI w/ Rotational atherectomy/PCI with Xience DES x 3 to RCA and rotational atherectomy/PCI with Xience DES to proximal LAD.  ~  EKG 6/12 showed SBrady, rate 58/min, otherw WNL...  HYPERLIPIDEMIA>  On diet + CRESTOR 40mg /d and FISH OIL 1000mg   Bid... ~  FLP 10/11 on Cres40 showed TChol 112, TG 100, HDL 34, LDL 58  GERD/ Esophagitis>  On PEPCID which he takes just prn & not on PPI due to his Plavix rx... ~  Labs 6/12 showed Hg= 14.7, MCV= 93...  DIVERTICULOSIS OF COLON (ICD-562.10) - last colon 11/05 by DrSamLeB w/ divertics only... He saw DrJacobs 10/11 & they decided on f/u colon in 2015 despite family hx of colon cancer in mother since he has never had a polyp etc... Hx of IRRITABLE BOWEL SYNDROME (ICD-564.1) - he denies recent abd pain, n/v, change in bowels, etc...  Hx of ELEVATED PROSTATE SPECIFIC ANTIGEN (ICD-790.93) - followed by DrPeterson for Urology on UROXATRAL 10mg /d, & PROSCAR 5mg /d;  He tells me that DrPeterson follows his PSAs, we do not have any recent notes from Urology. Hx of RENAL CYST (ICD-593.2)  OSTEOARTHRITIS (ICD-715.90) LOW BACK PAIN, CHRONIC (ICD-724.2) - severe back discomfort w/ eval by ortho (DrOlin & Ramos) resulting in epid steroid shots, a round of prednisone, and percocet + advil for pain...   Hx of MIGRAINE HEADACHE (ICD-346.90)  INSOMNIA>  On Ambien 5mg  prn use...  HEALTH MAINTENANCE: ~  GI:  Dr Christella Hartigan follows & f/u colon due 2015... ~  GU:  Followed by DrPeterson & he does his PSAs... ~  Immunizations:  He received Shingles vaccine in 2011   Past Medical History  Diagnosis Date  . CAD (coronary artery disease)     NSTEMOI 5/10. Rotational atherectomy/PCI w Xience DES x 3 to RCA and rotation atherectomy /PCI w Xience DES to prox LAd  . Abdominal pain, unspecified site   . Allergic rhinitis, cause unspecified   . Diverticulosis of colon (without mention of hemorrhage)     last colon 11/05 by DrSamLeB w divertics only  . Irritable bowel syndrome   . Elevated prostate specific antigen (PSA)     followed by Dr Vonita Moss for urology  . Acquired cyst of kidney   . Osteoarthrosis, unspecified whether generalized or localized, unspecified site   . Lumbago   . Migraine, unspecified, without  mention of intractable migraine without mention of status migrainosus   . Hypertension     ACEI cough  . GERD (gastroesophageal reflux disease)     esophagitis  . Hyperlipidemia   . Ischemic cardiomyopathy     mild echo (8/10) w EF 45-50%, diffuse hypokinesis, mild AI and mild MR    Past Surgical History  Procedure Date  . Rotator cuff surgery     Dr. Simonne Come  . Bilat inguinal hernia repairs 7/06    Dr Daphine Deutscher    Outpatient Encounter Prescriptions as of 12/09/2010  Medication Sig Dispense Refill  . alfuzosin (UROXATRAL) 10 MG 24 hr tablet Take 10 mg by mouth daily.        Marland Kitchen aspirin (ASPIR-LOW) 81 MG EC tablet Take 162 mg by mouth 2 (two) times daily.       . cetirizine (ZYRTEC ALLERGY) 10 MG tablet Take 10 mg by mouth as needed.        . diphenhydrAMINE (BENADRYL) 25 MG tablet Take 25 mg by mouth as needed. For itching        . famotidine (PEPCID) 20 MG tablet Take 20 mg by mouth 2 (two) times daily.       . finasteride (PROSCAR) 5 MG tablet Take 5 mg by mouth daily.        . Multiple Vitamins-Minerals (MULTIVITAL) tablet Take 1 tablet by mouth daily.        . nitroGLYCERIN (NITROSTAT) 0.4 MG SL tablet Place 0.4 mg under the tongue as needed.        . Omega-3 Fatty Acids (FISH OIL) 1000 MG CAPS Take by mouth 2 (two) times daily.        Marland Kitchen PLAVIX 75 MG tablet take 1 tablet by mouth once daily  30 tablet  10  . simethicone (MYLICON) 80 MG chewable tablet Chew 80 mg by mouth every 6 (six) hours as needed.        . Timolol Maleate 0.5 % (DAILY) SOLN Apply to eye. Instill one droplet in OU       . travoprost, benzalkonium, (TRAVATAN) 0.004 % ophthalmic solution Place 1 drop into both eyes at bedtime.        Marland Kitchen zolpidem (AMBIEN) 5 MG tablet Take 5 mg by mouth. 1 daily as needed for sleep       . pramipexole (MIRAPEX) 0.25 MG tablet Take 1-2 tablets by mouth at bedtime for restless leg syndrome  60 tablet  prn     Allergies  Allergen Reactions  . Codeine   .  EAV:[id number]+GNFAOZHYQ+MVHQIONGEX Acid+Aspartame     REACTION: pt developed HIVES on augmentin  . Morphine    ACE Inhibitors> Lisinopril w/ cough...     Review of  Systems       See HPI - all other systems neg except as noted...      The patient denies anorexia, fever, weight loss, weight gain, vision loss, decreased hearing, hoarseness, chest pain, syncope, peripheral edema, prolonged cough, hemoptysis, abdominal pain, melena, hematochezia, severe indigestion/heartburn, hematuria, incontinence, muscle weakness, suspicious skin lesions, transient blindness, difficulty walking, depression, unusual weight change, abnormal bleeding, enlarged lymph nodes, and angioedema.     Objective:   Physical Exam    WD, WN, 77 y/o WM in NAD...  GENERAL:  Alert & oriented; pleasant & cooperative. HEENT:  [place]/AT, EOM-wnl, PERRLA, EACs-clear, TMs-wnl, NOSE-clear, THROAT-clear & wnl. NECK:  Supple w/ full ROM; no JVD; normal carotid impulses w/o bruits; no thyromegaly or nodules palpated; no lymphadenopathy. CHEST:  Clear to P & A; without wheezes/ rales/ or rhonchi. HEART:  Regular Rhythm; without murmurs/ rubs/ or gallops. ABDOMEN:  Soft & nontender; normal bowel sounds; no organomegaly or masses detected. EXT: without deformities, mild arthritic changes; no varicose veins/ venous insuffic/ or edema. NEURO:  CN's intact; motor testing normal; sensory testing normal; gait normal & balance OK. DERM:  No lesions noted; no rash etc...   Assessment & Plan:   RLS>  He wants Mirapex Rx & we filled Rx for 0.25mg  1-2 Qhs ...  GI Symptoms/ GERD/ IBS/ Gas>  Followed by [employer], rec that he incr his Pepcid to Bid & add Mylicon, other Simethacone containing gas therapies...  CAD/ s/p NSTEMI 5/10 w/ PTCA & stents/ Ischemic Cardiomyopathy>  As above, he has had incr symptoms since ret from trip to [facility] & needs Cards f/u...  CHOL>  Stable on the Cres40...  GU>  Hx elev PSA>  All followed by  DrPeterson...  LBP/ DJD>  Followed by DrOlin & Ramos.Marland KitchenMarland Kitchen

## 2010-12-14 ENCOUNTER — Other Ambulatory Visit: Payer: Self-pay | Admitting: Cardiology

## 2010-12-17 ENCOUNTER — Encounter: Payer: Self-pay | Admitting: Physician Assistant

## 2010-12-17 ENCOUNTER — Ambulatory Visit (INDEPENDENT_AMBULATORY_CARE_PROVIDER_SITE_OTHER): Payer: Medicare Other | Admitting: Physician Assistant

## 2010-12-17 ENCOUNTER — Ambulatory Visit (HOSPITAL_COMMUNITY): Payer: Medicare Other | Attending: Physician Assistant | Admitting: Radiology

## 2010-12-17 VITALS — BP 94/60 | HR 62 | Ht 71.0 in | Wt 180.0 lb

## 2010-12-17 DIAGNOSIS — R0989 Other specified symptoms and signs involving the circulatory and respiratory systems: Secondary | ICD-10-CM | POA: Insufficient documentation

## 2010-12-17 DIAGNOSIS — I359 Nonrheumatic aortic valve disorder, unspecified: Secondary | ICD-10-CM | POA: Insufficient documentation

## 2010-12-17 DIAGNOSIS — I959 Hypotension, unspecified: Secondary | ICD-10-CM

## 2010-12-17 DIAGNOSIS — I079 Rheumatic tricuspid valve disease, unspecified: Secondary | ICD-10-CM | POA: Insufficient documentation

## 2010-12-17 DIAGNOSIS — R5383 Other fatigue: Secondary | ICD-10-CM

## 2010-12-17 DIAGNOSIS — E785 Hyperlipidemia, unspecified: Secondary | ICD-10-CM | POA: Insufficient documentation

## 2010-12-17 DIAGNOSIS — R079 Chest pain, unspecified: Secondary | ICD-10-CM

## 2010-12-17 DIAGNOSIS — R5381 Other malaise: Secondary | ICD-10-CM

## 2010-12-17 DIAGNOSIS — R0609 Other forms of dyspnea: Secondary | ICD-10-CM | POA: Insufficient documentation

## 2010-12-17 DIAGNOSIS — I379 Nonrheumatic pulmonary valve disorder, unspecified: Secondary | ICD-10-CM | POA: Insufficient documentation

## 2010-12-17 DIAGNOSIS — R0602 Shortness of breath: Secondary | ICD-10-CM

## 2010-12-17 DIAGNOSIS — I2510 Atherosclerotic heart disease of native coronary artery without angina pectoris: Secondary | ICD-10-CM

## 2010-12-17 NOTE — Assessment & Plan Note (Signed)
Question if this is the reason for his fatigue.  Decreased Cozaar 25 mg QD and carvedilol to 3.125 mg twice a day.  As noted recent blood work with Dr. Kriste Basque is within normal limits.

## 2010-12-17 NOTE — Assessment & Plan Note (Addendum)
Symptoms sound consistent with acid reflux.  He seems to respond to increased dosages of antacids as well as to belching.  However, he has had decreased exercise tolerance.  He also had indigestion prior to his myocardial infarction.  I was initially concerned he would need cardiac cath.  However, in looking at his blood pressures, I question if his decreased exercise tolerance is related and his indigestion is just that.  So, I reviewed his case further with Dr. Shirlee Latch.  The patient is leaving for a trip to the beach in several days.  We have decided to set him up for a stress Myoview and echocardiogram tomorrow.  As long as this is low risk, he can proceed with his trip.  If he has significant ischemia, he will need cardiac catheterization.  Follow up with Dr. Shirlee Latch in 2-3 weeks.

## 2010-12-17 NOTE — Assessment & Plan Note (Signed)
Arrange fasting lipids at the time of his Myoview.

## 2010-12-17 NOTE — Progress Notes (Signed)
History of Present Illness: Primary Cardiologist:  Dr. Marca Ancona  Willie Lynch is a 77 y.o. male with a h/o CAD, s/p NSTEMI in 5/10 s/p PCI to LAD and RCA.   Last saw Dr. Shirlee Latch in 1/12.  He restarted a beta blocker with coreg 6.25 mg BID and planned to see him back in 6 mos.  He returns for earlier follow up.  He has had decreased energy over the last couple weeks.  Also, notes increased indigestion.  Taking pepcid and belching helps.  He saw Dr. Kriste Basque recently who increased his Pepcid and asked he follow up today.  He developed bronchitis in the last week.  Has had some chest tightness with his cough.  He denies feeling lightheaded or having near syncope.  No syncope.  No orthopnea, PND or edema.  He denies exertional chest pain or dyspnea.  Of note, he had indigestion prior to his MI in 2010.  He has been doing his cardio exercises consistently.  Starting about 2-3 weeks ago, he started to notice that he could not do as much.  Labs (5/10): TSH normal Labs (8/10): ALT, AST normal. LDL 40, HDL 28 Labs (2/11): LFTs normal, LDL 49, HDL 44, K 3.8, creatinine 0.7 Labs (3/11): K 4.2, creatinine 0.7 Labs (4/11): LFTs nromal, LDL 43, HDL 35 Labs (10/11): HDL 34, LDL 58, LFTs normal Labs (56/43): K 4.1, creatinine 0.7 Labs (6/12):   K 4.1, Creatinine 0.8, Hgb 14.7, ALT 30, TSH 2.689   Past Medical History  Diagnosis Date  . CAD (coronary artery disease)     NSTEMI 5/10. Rotational atherectomy/PCI w Xience DES x 3 to RCA and rotation atherectomy /PCI w Xience DES to prox LAD  . Abdominal pain, unspecified site   . Allergic rhinitis, cause unspecified   . Diverticulosis of colon (without mention of hemorrhage)     last colon 11/05 by DrSamLeB w divertics only  . Irritable bowel syndrome   . Elevated prostate specific antigen (PSA)     followed by Dr Vonita Moss for urology  . Acquired cyst of kidney   . Osteoarthrosis, unspecified whether generalized or localized, unspecified site   . Lumbago    . Migraine, unspecified, without mention of intractable migraine without mention of status migrainosus   . Hypertension     ACEI cough  . GERD (gastroesophageal reflux disease)     esophagitis  . Hyperlipidemia   . Ischemic cardiomyopathy     mild echo (8/10) w EF 45-50%, diffuse hypokinesis, mild AI and mild MR    Current Outpatient Prescriptions  Medication Sig Dispense Refill  . alfuzosin (UROXATRAL) 10 MG 24 hr tablet Take 10 mg by mouth daily.        Marland Kitchen aspirin (ASPIR-LOW) 81 MG EC tablet Take 162 mg by mouth 2 (two) times daily.       . carvedilol (COREG) 6.25 MG tablet Take 0.5 tablets (3.125 mg total) by mouth 2 (two) times daily.      . cetirizine (ZYRTEC ALLERGY) 10 MG tablet Take 10 mg by mouth as needed.        . CRESTOR 40 MG tablet take 1 tablet by mouth once daily  30 tablet  5  . diphenhydrAMINE (BENADRYL) 25 MG tablet Take 25 mg by mouth as needed. For itching        . famotidine (PEPCID) 20 MG tablet Take 20 mg by mouth 2 (two) times daily.       . finasteride (PROSCAR) 5 MG  tablet Take 5 mg by mouth daily.        Marland Kitchen losartan (COZAAR) 50 MG tablet Take 0.5 tablets (25 mg total) by mouth daily.      . Multiple Vitamins-Minerals (MULTIVITAL) tablet Take 1 tablet by mouth daily.        . nitroGLYCERIN (NITROSTAT) 0.4 MG SL tablet Place 0.4 mg under the tongue as needed.        . Omega-3 Fatty Acids (FISH OIL) 1000 MG CAPS Take by mouth 2 (two) times daily.        Marland Kitchen PLAVIX 75 MG tablet take 1 tablet by mouth once daily  30 tablet  10  . pramipexole (MIRAPEX) 0.25 MG tablet Take 1-2 tablets by mouth at bedtime for restless leg syndrome  60 tablet  prn  . simethicone (MYLICON) 80 MG chewable tablet Chew 80 mg by mouth every 6 (six) hours as needed.        . Timolol Maleate 0.5 % (DAILY) SOLN Apply to eye. Instill one droplet in OU       . travoprost, benzalkonium, (TRAVATAN) 0.004 % ophthalmic solution Place 1 drop into both eyes at bedtime.        Marland Kitchen zolpidem (AMBIEN) 5 MG  tablet Take 5 mg by mouth. 1 daily as needed for sleep       . DISCONTD: carvedilol (COREG) 6.25 MG tablet Take 6.25 mg by mouth 2 (two) times daily.        Marland Kitchen DISCONTD: losartan (COZAAR) 50 MG tablet take 1 tablet by mouth once daily  30 tablet  6    Allergies: Allergies  Allergen Reactions  . Codeine   . PPI:[id number]+[id number]+ATFTDDUKGU Acid+Aspartame     REACTION: pt developed HIVES on augmentin  . Morphine     Social history: Nonsmoker  ROS:  See history of present illness.  He denies fevers, chills, melena, hematochezia, dysuria.  He has had some loose stools.  No vomiting or hematemesis or hemoptysis.  All other systems reviewed and negative.  Vital Signs: BP 94/60  Pulse 62  Ht 5\' 11"  (1.803 m)  Wt 180 lb (81.647 kg)  BMI 25.10 kg/m2 Repeat blood pressure 88/52 on the left and 90/60 on the right  PHYSICAL EXAM: Well nourished, well developed, in no acute distress HEENT: normal Neck: no JVD Endocrine: No thyromegaly Vascular: No carotid bruits Cardiac:  normal S1, S2; RRR; no murmur Lungs:  clear to auscultation bilaterally, no wheezing, rhonchi or rales Abd: soft, nontender, no hepatomegaly Ext: no edema Skin: warm and dry Neuro:  CNs 2-12 intact, no focal abnormalities noted Psych: Normal affect  EKG:  Normal sinus rhythm, heart rate 62, normal axis, poor R-wave progression, No acute changes  ASSESSMENT AND PLAN:

## 2010-12-17 NOTE — Patient Instructions (Addendum)
Your physician recommends that you schedule a follow-up appointment in: 2-3 weeks with Dr. Shirlee Latch as per Tereso Newcomer, PA-C  Your physician has requested that you have en exercise stress myoview 786.50, 780.79 THIS NEEDS TO BE STAT DONE 12/18/10 AS PER SCOTT WEAVER, PA-C. For further information please visit [email or web address] Please follow instruction sheet, as given.  Your physician has recommended you make the following change in your medication: DECREASE COREG TAKE 1/2 (HALF) TAB TWICE DAILY; ALSO DECREASE COZAAR 1/2 (HALF) TAB DAILY  Your physician has requested that you have an echocardiogram STAT 786.50, 780.79. Echocardiography is a painless test that uses sound waves to create images of your heart. It provides your doctor with information about the size and shape of your heart and how well your heart's chambers and valves are working. This procedure takes approximately one hour. There are no restrictions for this procedure.

## 2010-12-17 NOTE — Assessment & Plan Note (Addendum)
Proceed with Myoview and echo as noted.  Follow up in 2 weeks.  Continue aspirin and Plavix.

## 2010-12-17 NOTE — Assessment & Plan Note (Signed)
CBC and TSH recently normal.  Decrease blood pressure medications as noted.

## 2010-12-18 ENCOUNTER — Encounter: Payer: Self-pay | Admitting: *Deleted

## 2010-12-18 ENCOUNTER — Other Ambulatory Visit (HOSPITAL_COMMUNITY): Payer: Medicare Other | Admitting: Radiology

## 2010-12-18 ENCOUNTER — Ambulatory Visit (HOSPITAL_COMMUNITY): Payer: Medicare Other | Attending: Physician Assistant | Admitting: Radiology

## 2010-12-18 DIAGNOSIS — R5383 Other fatigue: Secondary | ICD-10-CM | POA: Insufficient documentation

## 2010-12-18 DIAGNOSIS — I2510 Atherosclerotic heart disease of native coronary artery without angina pectoris: Secondary | ICD-10-CM

## 2010-12-18 DIAGNOSIS — R079 Chest pain, unspecified: Secondary | ICD-10-CM | POA: Insufficient documentation

## 2010-12-18 DIAGNOSIS — R5381 Other malaise: Secondary | ICD-10-CM | POA: Insufficient documentation

## 2010-12-18 DIAGNOSIS — I959 Hypotension, unspecified: Secondary | ICD-10-CM | POA: Insufficient documentation

## 2010-12-18 MED ORDER — TECHNETIUM TC 99M TETROFOSMIN IV KIT
33.0000 | PACK | Freq: Once | INTRAVENOUS | Status: AC | PRN
  Administered 2010-12-18: 33 via INTRAVENOUS

## 2010-12-18 MED ORDER — TECHNETIUM TC 99M TETROFOSMIN IV KIT
11.0000 | PACK | Freq: Once | INTRAVENOUS | Status: AC | PRN
  Administered 2010-12-18: 11 via INTRAVENOUS

## 2010-12-18 NOTE — Progress Notes (Addendum)
[facility] SITE 3 NUCLEAR MED [address] [place] [address] [phone]  Cardiology Nuclear Med Study  Willie Lynch is a 77 y.o. male [id number] 05-13-1934   Nuclear Med Background Indication for Stress Test:  Evaluation for Ischemia and Stent Patency History: 8/10 Echo: EF 45-50%, 05/10 Heart Catheterization: , 2010 Myocardial Infarction: STEMI, 09/03 Myocardial Perfusion Study;(-) ischemia EF 60% and 05/10 Stents: x3 RCA and Prox LAD with Atherectomy Cardiac Risk Factors: Hypertension and Lipids  Symptoms:  Chest Tightness, DOE, Fatigue, Palpitations and SOB   Nuclear Pre-Procedure Caffeine/Decaff Intake:  None NPO After: 7:30pm   Lungs:  clear IV 0.9% NS with Angio Cath:  20g  IV Site: R Hand  IV Started by:  Willie Levan, RN  Chest Size (in):  42 Cup Size: n/a  Height: 5\' 10"  (1.778 m)  Weight:  178 lb (80.74 kg)  BMI:  Body mass index is 25.54 kg/(m^2). Tech Comments:  Held Coreg this AM.  Due to hypotension, sob, and his gray/pale color. The patient was given 1000 cc NaCl bolus for BP of 81/56. Dr. Shirlee Latch came and saw the patient and took him off of his [place]arten. The patient is going to follow up on December 29, 2008., at this office.    Nuclear Med Study 1 or 2 day study: 1 day  Stress Test Type:  Stress  Reading MD: Willie Clement, MD  Order Authorizing Provider:  Dr. Golden Circle  Resting Radionuclide: Technetium 6m Tetrofosmin  Resting Radionuclide Dose: 11 mCi   Stress Radionuclide:  Technetium 58m Tetrofosmin  Stress Radionuclide Dose: 33 mCi           Stress Protocol Rest HR: 69 Stress HR: 126  Rest BP: 81/56 Stress BP: 160/69  Exercise Time (min): 7:33 METS: 9.5   Predicted Max HR: 145 bpm % Max HR: 86.9 bpm Rate Pressure Product: 21308   Dose of Adenosine (mg):  n/a Dose of Lexiscan: n/a mg  Dose of Atropine (mg): n/a Dose of Dobutamine: n/a mcg/kg/min (at max HR)  Stress Test Technologist: Willie Lynch, EMT-P  Nuclear  Technologist:  Willie Lynch, CNMT     Rest Procedure:  Myocardial perfusion imaging was performed at rest 45 minutes following the intravenous administration of Technetium 49m Tetrofosmin. Rest ECG: NSR  Stress Procedure:  The patient exercised for 7:33.  The patient stopped due to fatigue.sob and denied any chest pain.  There were non specific ST-T wave changes and a rare pac.  Technetium 76m Tetrofosmin was injected at peak exercise and myocardial perfusion imaging was performed after a brief delay. Stress ECG: No significant change from baseline ECG  QPS Raw Data Images:  Normal; no motion artifact; normal heart/lung ratio. Stress Images:  Normal homogeneous uptake in all areas of the myocardium. Rest Images:  Normal homogeneous uptake in all areas of the myocardium. Subtraction (SDS):  No evidence of ischemia. Transient Ischemic Dilatation (Normal <1.22):  1.13 Lung/Heart Ratio (Normal <0.45):  .32   Quantitative Gated Spect Images QGS EDV:  112 ml QGS ESV:  45 ml QGS cine images:  NL LV Function; NL Wall Motion QGS EF: 59%  Impression Exercise Capacity:  Good exercise capacity. BP Response:  Normal blood pressure response. Clinical Symptoms:  No chest pain. ECG Impression:  No significant ST segment change suggestive of ischemia. Comparison with Prior Nuclear Study: No images to compare  Overall Impression:  Normal stress nuclear study.      Willie Lynch  Please let patient  know that study was normal.   Willie Lynch

## 2010-12-18 NOTE — Progress Notes (Signed)
Nuclear report routed to Dr. McLean. Willie Lynch H  

## 2010-12-19 ENCOUNTER — Telehealth: Payer: Self-pay | Admitting: Adult Health

## 2010-12-19 MED ORDER — AZITHROMYCIN 250 MG PO TABS: ORAL_TABLET | ORAL | Status: AC

## 2010-12-19 NOTE — Telephone Encounter (Signed)
Spoke with pt's spouse. She states pt c/o low grade temp, cough, congestion onset x 1 wk. Pt requesting zpack since they are getting ready to [place] beach.  Per TP okay to call in zpack # 1 as directed. Rx sent to pharm. Pt aware to contact out office or seek emergency care if not improving.

## 2010-12-20 ENCOUNTER — Encounter: Payer: Self-pay | Admitting: Pulmonary Disease

## 2010-12-22 NOTE — Progress Notes (Signed)
lmtcb ./cy 

## 2010-12-25 ENCOUNTER — Telehealth: Payer: Self-pay | Admitting: Cardiology

## 2010-12-25 NOTE — Telephone Encounter (Signed)
Pt's wife calling to see when his next appt is, I don't see one other than 7-10 that he cxl, wife said when he was here it was rs because the PA said he needed to be seen after stress test and echo

## 2010-12-25 NOTE — Telephone Encounter (Signed)
Patient very upset because his appointment was cancelled on 12/30/10. I'm not sure who cancelled this but I have added him on to see Mclean 7/10 @ 11:15 am.

## 2010-12-29 NOTE — Progress Notes (Signed)
appt 12/30/10 with Dr Shirlee Latch

## 2010-12-30 ENCOUNTER — Ambulatory Visit: Payer: Self-pay | Admitting: Cardiology

## 2010-12-30 ENCOUNTER — Ambulatory Visit (INDEPENDENT_AMBULATORY_CARE_PROVIDER_SITE_OTHER): Payer: Medicare Other | Admitting: Cardiology

## 2010-12-30 VITALS — BP 118/74 | HR 60 | Resp 16 | Ht 71.0 in | Wt 182.0 lb

## 2010-12-30 DIAGNOSIS — R5381 Other malaise: Secondary | ICD-10-CM

## 2010-12-30 DIAGNOSIS — R5383 Other fatigue: Secondary | ICD-10-CM

## 2010-12-30 DIAGNOSIS — E785 Hyperlipidemia, unspecified: Secondary | ICD-10-CM

## 2010-12-30 DIAGNOSIS — E7800 Pure hypercholesterolemia, unspecified: Secondary | ICD-10-CM

## 2010-12-30 DIAGNOSIS — I2510 Atherosclerotic heart disease of native coronary artery without angina pectoris: Secondary | ICD-10-CM

## 2010-12-30 DIAGNOSIS — I2589 Other forms of chronic ischemic heart disease: Secondary | ICD-10-CM

## 2010-12-30 NOTE — Patient Instructions (Signed)
Have fasting lab Monday July 16,2012 at the [place] office lab.   Your physician wants you to follow-up in: 6 months with Dr Shirlee Latch. You will receive a reminder letter in the mail two months in advance. If you don't receive a letter, please call our office to schedule the follow-up appointment.

## 2010-12-31 ENCOUNTER — Encounter: Payer: Self-pay | Admitting: Cardiology

## 2010-12-31 NOTE — Assessment & Plan Note (Signed)
EF stable at 45-50% (mild ischemic cardiomyopathy).  BNP was not elevated.  Euvolemic on exam.  I am going to have him continue Coreg 3.125 mg bid.  He will restart losartan at 25 mg daily.

## 2010-12-31 NOTE — Assessment & Plan Note (Signed)
No ischemic symptoms currently.  ETT-myoview last month with no ischemia or infarction.  Continue ASA, beta blocker, statin, ARB.

## 2010-12-31 NOTE — Assessment & Plan Note (Signed)
I suspect that the patient's symptom complex earlier last month was most likely due to a viral syndrome.  He has completely recovered symptomatically.

## 2010-12-31 NOTE — Progress Notes (Signed)
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   Last month, he developed severe fatigue and dyspnea after a 10 day trip to [place].  BP was running on the low side (as low as systolic in the 80s).  No chest pain or leg swelling.  He did have bronchitis-like symptoms with cough and congestion.  I had him cut his Coreg in half and stop losartan given hypotension.  He did an ETT-myoview showing no ischemia or infarction.  Echo showed EF 45-50% (stable from prior).    For the last 7-10 days, patient has felt back to normal.  He is going on 30-40 minute walks 6 days a week without dyspnea.  He played golf 2 days in the last week with no problems.  BP today is 118/74.   Labs (3/11): K 4.2, creatinine 0.7  Labs (4/11): LFTs nromal, LDL 43, HDL 35  Labs (10/11): HDL 34, LDL 58, LFTs normal  Labs (11/11): K 4.1, creatinine 0.7  Labs (6/12): K 4.1, creatinine 0.8, TSH normal, BNP 19  Allergies:  1) ! Codeine  2) ! Morphine  3) ! Augmentin (Amoxicillin-Pot Clavulanate)   Past Medical History:  1. CAD: NSTEMI 5/10. Rotational atherectomy/PCI with Xience DES x 3 to RCA and rotational atherectomy/PCI with Xience DES to proximal LAD. ETT-myoview (6/12): 7:33, no ischemic ECG changes, EF 59%, no ischemia or infarction on perfusion images.  2. ABDOMINAL PAIN (ICD-789.00) -.  3. ALLERGIC RHINITIS (ICD-477.9)  4. DIVERTICULOSIS OF COLON (ICD-562.10) - last colon 11/05 by DrSamLeB w/ divertics only.  5. IRRITABLE BOWEL SYNDROME (ICD-564.1)  6. Hx of ELEVATED PROSTATE SPECIFIC ANTIGEN (ICD-790.93) - followed by Dr Vonita Moss for urology  7. Hx of RENAL CYST (ICD-593.2)  8. OSTEOARTHRITIS (ICD-715.90)  9. LOW BACK PAIN, CHRONIC (ICD-724.2) -  10. Hx of MIGRAINE HEADACHE (ICD-346.90)  11. HTN: ACEI cough.  12. Esophagitis/GERD  13. Hyperlipidemia  14. Ischemic cardiomyopathy: Mild. Echo (8/10) with EF 45-50%, diffuse hypokinesis, mild AI and mild MR.  Echo (6/12): EF 45-50%, basal to mid  inferoposterior hypokinesis, mild aortic insufficiency, grade I diastolic dysfunction.   Family History:  mother died in her early 59s from metastatic cancer, family believes it may have been colon cancer that was diagnosed in her 58s  Father deceased age 88 - heart disease  One sibling alive age 20  One sibling deceased age 42 - unknown  One sibling deceased age 37 -unknown   Social History:  Quit smoking 1963  Alcohol - 4 drinks per week  Exercises  Caffeine - 2 cups per day  Married  2 children  Previously worked for [employer], now Research scientist (medical).   Review of Systems  All systems reviewed and negative except as per HPI.   Current Outpatient Prescriptions  Medication Sig Dispense Refill  . alfuzosin (UROXATRAL) 10 MG 24 hr tablet Take 10 mg by mouth daily.        Marland Kitchen aspirin (ASPIR-LOW) 81 MG EC tablet Take 162 mg by mouth 2 (two) times daily.       . carvedilol (COREG) 6.25 MG tablet Take 0.5 tablets (3.125 mg total) by mouth 2 (two) times daily.      . cetirizine (ZYRTEC ALLERGY) 10 MG tablet Take 10 mg by mouth as needed.        . CRESTOR 40 MG tablet take 1 tablet by mouth once daily  30 tablet  5  . diphenhydrAMINE (BENADRYL) 25 MG tablet Take 25  mg by mouth as needed. For itching        . famotidine (PEPCID) 20 MG tablet Take 20 mg by mouth 2 (two) times daily.       . finasteride (PROSCAR) 5 MG tablet Take 5 mg by mouth daily.        . Multiple Vitamins-Minerals (MULTIVITAL) tablet Take 1 tablet by mouth daily.        . nitroGLYCERIN (NITROSTAT) 0.4 MG SL tablet Place 0.4 mg under the tongue as needed.        . Omega-3 Fatty Acids (FISH OIL) 1000 MG CAPS Take by mouth 2 (two) times daily.        Marland Kitchen PLAVIX 75 MG tablet take 1 tablet by mouth once daily  30 tablet  10  . pramipexole (MIRAPEX) 0.25 MG tablet Take 1-2 tablets by mouth at bedtime for restless leg syndrome  60 tablet  prn  . simethicone (MYLICON) 80 MG chewable tablet Chew 80 mg by mouth every 6 (six) hours as  needed.        . Timolol Maleate 0.5 % (DAILY) SOLN Apply to eye. Instill one droplet in OU       . travoprost, benzalkonium, (TRAVATAN) 0.004 % ophthalmic solution Place 1 drop into both eyes at bedtime.        Marland Kitchen zolpidem (AMBIEN) 5 MG tablet Take 5 mg by mouth. 1 daily as needed for sleep         BP 118/74  Pulse 60  Resp 16  Ht 5\' 11"  (1.803 m)  Wt 182 lb (82.555 kg)  BMI 25.38 kg/m2 General: NAD Neck: No JVD, no thyromegaly or thyroid nodule.  Lungs: Clear to auscultation bilaterally with normal respiratory effort. CV: Nondisplaced PMI.  Heart regular S1/S2, no S3/S4, no murmur.  No peripheral edema.  No carotid bruit.  Normal pedal pulses.  Abdomen: Soft, nontender, no hepatosplenomegaly, no distention.  Neurologic: Alert and oriented x 3.  Psych: Normal affect. Extremities: No clubbing or cyanosis.

## 2010-12-31 NOTE — Assessment & Plan Note (Signed)
Check lipids/LFTs, goal LDL < 70.  

## 2011-01-05 ENCOUNTER — Other Ambulatory Visit (INDEPENDENT_AMBULATORY_CARE_PROVIDER_SITE_OTHER): Payer: Medicare Other

## 2011-01-05 DIAGNOSIS — I2510 Atherosclerotic heart disease of native coronary artery without angina pectoris: Secondary | ICD-10-CM

## 2011-01-05 LAB — LIPID PANEL: Cholesterol: 123 mg/dL (ref 0–200)

## 2011-01-06 ENCOUNTER — Telehealth: Payer: Self-pay | Admitting: Cardiology

## 2011-01-06 NOTE — Telephone Encounter (Signed)
I discussed recent lab results with pt

## 2011-01-06 NOTE — Telephone Encounter (Signed)
Pt returning your call

## 2011-02-17 ENCOUNTER — Ambulatory Visit (INDEPENDENT_AMBULATORY_CARE_PROVIDER_SITE_OTHER)
Admission: RE | Admit: 2011-02-17 | Discharge: 2011-02-17 | Disposition: A | Discharge status: Home / Self Care | Payer: Medicare Other | Source: Ambulatory Visit | Attending: Pulmonary Disease | Admitting: Pulmonary Disease

## 2011-02-17 ENCOUNTER — Telehealth: Payer: Self-pay | Admitting: Pulmonary Disease

## 2011-02-17 DIAGNOSIS — R0600 Dyspnea, unspecified: Secondary | ICD-10-CM

## 2011-02-17 DIAGNOSIS — R0609 Other forms of dyspnea: Secondary | ICD-10-CM

## 2011-02-17 NOTE — Telephone Encounter (Signed)
Order placed for cxr. Spoke with pt and notified that this was done.

## 2011-02-17 NOTE — Telephone Encounter (Signed)
Spoke with pt and he states he has had an increase in his SOB x 1 week. Pt states he has minor pain in his diaphragm area. Pt denies any cough, wheezing, chest tightness. Pt is wanting to have an cxr done today if possible. Please advise Dr. Kriste Basque. Thanks  Allergies  Allergen Reactions  . Codeine   . ZOX:[id number] Acid+Aspartame     REACTION: pt developed HIVES on augmentin  . Morphine     Carver Fila, CMA

## 2011-02-17 NOTE — Telephone Encounter (Signed)
Per SN----ok for cxr  Dx sob, dyspnea.  thanks

## 2011-02-18 ENCOUNTER — Ambulatory Visit (INDEPENDENT_AMBULATORY_CARE_PROVIDER_SITE_OTHER): Payer: Medicare Other | Admitting: Physician Assistant

## 2011-02-18 ENCOUNTER — Encounter: Payer: Self-pay | Admitting: *Deleted

## 2011-02-18 ENCOUNTER — Telehealth: Payer: Self-pay | Admitting: *Deleted

## 2011-02-18 ENCOUNTER — Encounter: Payer: Self-pay | Admitting: Physician Assistant

## 2011-02-18 ENCOUNTER — Telehealth: Payer: Self-pay | Admitting: Cardiology

## 2011-02-18 VITALS — BP 130/84 | HR 73 | Ht 70.0 in | Wt 185.8 lb

## 2011-02-18 DIAGNOSIS — Z7901 Long term (current) use of anticoagulants: Secondary | ICD-10-CM

## 2011-02-18 DIAGNOSIS — I10 Essential (primary) hypertension: Secondary | ICD-10-CM

## 2011-02-18 DIAGNOSIS — R0600 Dyspnea, unspecified: Secondary | ICD-10-CM | POA: Insufficient documentation

## 2011-02-18 DIAGNOSIS — R0609 Other forms of dyspnea: Secondary | ICD-10-CM

## 2011-02-18 DIAGNOSIS — I2510 Atherosclerotic heart disease of native coronary artery without angina pectoris: Secondary | ICD-10-CM

## 2011-02-18 DIAGNOSIS — E7800 Pure hypercholesterolemia, unspecified: Secondary | ICD-10-CM

## 2011-02-18 DIAGNOSIS — R0989 Other specified symptoms and signs involving the circulatory and respiratory systems: Secondary | ICD-10-CM

## 2011-02-18 NOTE — Telephone Encounter (Signed)
Started phone not by [facility] pt results from results note per SN.

## 2011-02-18 NOTE — Telephone Encounter (Signed)
Pt wants a call from anne re fu from last visit

## 2011-02-18 NOTE — Assessment & Plan Note (Signed)
Continue ASA, Plavix, statin.  Proceed to cath as noted.

## 2011-02-18 NOTE — Assessment & Plan Note (Addendum)
Etiology unclear.  I cannot rule out anginal equivalent.  He had a recent CXR that was ok.  He does not display any signs of CHF.  He did have DOE prior to his MI.  He had a recent low risk Myoview.  I have suggested cardiac cath to him for further evaluation.  Risks and benefits of cardiac catheterization have been discussed with the patient.  These include bleeding, infection, kidney damage, stroke, heart attack, death.  The patient understands these risks and is willing to proceed.  I was in touch with Dr. Shirlee Latch.  We will arrange for this in the outpatient lab.

## 2011-02-18 NOTE — Progress Notes (Signed)
I agree with Natale Lay note.  Cath is appropriate.  I am in [place] the rest of this week so he will schedule it with the MD in the cath lab on Thursday or Friday.

## 2011-02-18 NOTE — Assessment & Plan Note (Signed)
Controlled.  

## 2011-02-18 NOTE — Telephone Encounter (Signed)
I talked with pt's wife. Pt not at home. Wife states pt c/o SOB for the last 2 weeks. Wife states pt called Dr Kriste Basque yesterday and had a chest xray done. Wife states pt was told xray looked OK. Wife also states pt has a poor  appetite. Wife states she does not think pt is having any chest pain /tightness.  I have given pt an appointment to see Tereso Newcomer 02/19/11 to evaluate SOB. Dr Shirlee Latch is aware.

## 2011-02-18 NOTE — Patient Instructions (Signed)
Your physician has requested that you have a cardiac catheterization. Cardiac catheterization is used to diagnose and/or treat various heart conditions. Doctors may recommend this procedure for a number of different reasons. The most common reason is to evaluate chest pain. Chest pain can be a symptom of coronary artery disease (CAD), and cardiac catheterization can show whether plaque is narrowing or blocking your heart's arteries. This procedure is also used to evaluate the valves, as well as measure the blood flow and oxygen levels in different parts of your heart. For further information please visit [email or web address] Please follow instruction sheet, as given.  Your physician recommends that you return for lab work in: PRE CATH LABS TODAY BMET, CBC W/DIFF, PT/INR

## 2011-02-18 NOTE — Assessment & Plan Note (Signed)
Recent LDL optimal.

## 2011-02-18 NOTE — Progress Notes (Signed)
History of Present Illness Primary Cardiologist:  Dr. 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 male with a h/o CAD, s/p NSTEMI in 5/10 s/p PCI to LAD and RCA.   I saw him in 6/12 and he had some chest pain and we did an ETT-myoview showing no ischemia or infarction.  Echo was also done and showed EF 45-50% (stable from prior).    Labs (3/11): K 4.2, creatinine 0.7  Labs (4/11): LFTs nromal, LDL 43, HDL 35  Labs (10/11): HDL 34, LDL 58, LFTs normal  Labs (11/11): K 4.1, creatinine 0.7  Labs (6/12): K 4.1, creatinine 0.8, TSH normal, BNP 19 Labs (7/12):  LDL 64  He called in recently with DOE.  He describes class 2b symptoms.  This is worse.  He had a CXR with Dr. Kriste Basque 8/28.  It was unremarkable.  He denies chest pain.  He did have some arm pain recently.  This was not associated with his dyspnea.  No jaw symptoms.  No nausea or diaphoresis.  No syncope.  No orthopnea, PND or edema. Of note, he had DOE prior to his MI as well.  No pleuritic chest pain.  No recent prolonged travel.  No cough or wheezing.  Past Medical History:  1. CAD: NSTEMI 5/10. Rotational atherectomy/PCI with Xience DES x 3 to RCA and rotational atherectomy/PCI with Xience DES to proximal LAD. ETT-myoview (6/12): 7:33, no ischemic ECG changes, EF 59%, no ischemia or infarction on perfusion images.  2. ABDOMINAL PAIN (ICD-789.00) -.  3. ALLERGIC RHINITIS (ICD-477.9)  4. DIVERTICULOSIS OF COLON (ICD-562.10) - last colon 11/05 by DrSamLeB w/ divertics only.  5. IRRITABLE BOWEL SYNDROME (ICD-564.1)  6. Hx of ELEVATED PROSTATE SPECIFIC ANTIGEN (ICD-790.93) - followed by Dr Vonita Moss for urology  7. Hx of RENAL CYST (ICD-593.2)  8. OSTEOARTHRITIS (ICD-715.90)  9. LOW BACK PAIN, CHRONIC (ICD-724.2) -  10. Hx of MIGRAINE HEADACHE (ICD-346.90)  11. HTN: ACEI cough.  12. Esophagitis/GERD  13. Hyperlipidemia  14. Ischemic cardiomyopathy: Mild. Echo (8/10) with EF 45-50%, diffuse hypokinesis, mild AI and mild MR.   Echo (6/12): EF 45-50%, basal to mid inferoposterior hypokinesis, mild aortic insufficiency, grade I diastolic dysfunction.    Current Outpatient Prescriptions  Medication Sig Dispense Refill  . alfuzosin (UROXATRAL) 10 MG 24 hr tablet Take 10 mg by mouth daily.        Marland Kitchen aspirin (ASPIR-LOW) 81 MG EC tablet Take 162 mg by mouth 2 (two) times daily.       . carvedilol (COREG) 6.25 MG tablet Take 3.125 mg by mouth 2 (two) times daily. Take 1/2 tablet daily      . cetirizine (ZYRTEC ALLERGY) 10 MG tablet Take 10 mg by mouth as needed.        . CRESTOR 40 MG tablet take 1 tablet by mouth once daily  30 tablet  5  . diphenhydrAMINE (BENADRYL) 25 MG tablet Take 25 mg by mouth as needed. For itching        . famotidine (PEPCID) 20 MG tablet Take 20 mg by mouth 2 (two) times daily.       . finasteride (PROSCAR) 5 MG tablet Take 5 mg by mouth daily.        Marland Kitchen losartan (COZAAR) 50 MG tablet Take 50 mg by mouth daily. Take 1/2 daily        . Multiple Vitamins-Minerals (MULTIVITAL) tablet Take 1 tablet by mouth daily.        Marland Kitchen  nitroGLYCERIN (NITROSTAT) 0.4 MG SL tablet Place 0.4 mg under the tongue as needed.        . Omega-3 Fatty Acids (FISH OIL) 1000 MG CAPS Take by mouth 2 (two) times daily.        Marland Kitchen PLAVIX 75 MG tablet take 1 tablet by mouth once daily  30 tablet  10  . pramipexole (MIRAPEX) 0.25 MG tablet Take 1-2 tablets by mouth at bedtime for restless leg syndrome  60 tablet  prn  . simethicone (MYLICON) 80 MG chewable tablet Chew 80 mg by mouth every 6 (six) hours as needed.        . Timolol Maleate 0.5 % (DAILY) SOLN Apply to eye. Instill one droplet in OU       . travoprost, benzalkonium, (TRAVATAN) 0.004 % ophthalmic solution Place 1 drop into both eyes at bedtime.        Marland Kitchen zolpidem (AMBIEN) 5 MG tablet Take 5 mg by mouth. 1 daily as needed for sleep         Allergies: Allergies  Allergen Reactions  . Codeine   . ZOX:[id number] Acid+Aspartame     REACTION: pt  developed HIVES on augmentin  . Morphine    Social Hx:  Ex smoker  ROS:  Please see the history of present illness.  All other systems reviewed and negative.   Vital Signs: BP 130/84  Pulse 73  Ht 5\' 10"  (1.778 m)  Wt 185 lb 12.8 oz (84.278 kg)  BMI 26.66 kg/m2  PHYSICAL EXAM: Well nourished, well developed, in no acute distress HEENT: normal Neck: no JVD Endocrine: no thyromegaly Vascular:  No carotid bruits; 2+ FA bilaterally Cardiac:  normal S1, S2; RRR; no murmur Lungs:  clear to auscultation bilaterally, no wheezing, rhonchi or rales Abd: soft, nontender, no hepatomegaly Ext: no edema Skin: warm and dry Neuro:  CNs 2-12 intact, no focal abnormalities noted Psych: normal affect   EKG:  Sinus rhythm, heart rate 72, normal axis, and no ischemic changes  ASSESSMENT AND PLAN:

## 2011-02-19 ENCOUNTER — Ambulatory Visit: Payer: Medicare Other | Admitting: Physician Assistant

## 2011-02-19 LAB — CBC WITH DIFFERENTIAL/PLATELET
Basophils Relative: 0.3 % (ref 0.0–3.0)
Eosinophils Relative: 2.4 % (ref 0.0–5.0)
Hemoglobin: 14.3 g/dL (ref 13.0–17.0)
Lymphocytes Relative: 30.6 % (ref 12.0–46.0)
MCV: 92.8 fl (ref 78.0–100.0)
Neutro Abs: 2.6 10*3/uL (ref 1.4–7.7)
Neutrophils Relative %: 55.7 % (ref 43.0–77.0)
RBC: 4.5 Mil/uL (ref 4.22–5.81)
WBC: 4.6 10*3/uL (ref 4.5–10.5)

## 2011-02-19 LAB — BASIC METABOLIC PANEL
Calcium: 9 mg/dL (ref 8.4–10.5)
Chloride: 107 mEq/L (ref 96–112)
Creatinine, Ser: 0.8 mg/dL (ref 0.4–1.5)
Sodium: 139 mEq/L (ref 135–145)

## 2011-02-19 LAB — PROTIME-INR: INR: 0.9 ratio (ref 0.8–1.0)

## 2011-02-20 ENCOUNTER — Inpatient Hospital Stay (HOSPITAL_BASED_OUTPATIENT_CLINIC_OR_DEPARTMENT_OTHER)
Admission: RE | Admit: 2011-02-20 | Discharge: 2011-02-20 | Disposition: A | Discharge status: Home / Self Care | Payer: Medicare Other | Source: Ambulatory Visit | Attending: Cardiovascular Disease | Admitting: Cardiovascular Disease

## 2011-02-20 ENCOUNTER — Telehealth: Payer: Self-pay | Admitting: *Deleted

## 2011-02-20 DIAGNOSIS — I2510 Atherosclerotic heart disease of native coronary artery without angina pectoris: Secondary | ICD-10-CM

## 2011-02-20 DIAGNOSIS — R0602 Shortness of breath: Secondary | ICD-10-CM | POA: Insufficient documentation

## 2011-02-20 DIAGNOSIS — Z9861 Coronary angioplasty status: Secondary | ICD-10-CM | POA: Insufficient documentation

## 2011-02-20 NOTE — Telephone Encounter (Signed)
Pt aware of lab results today. Willie Lynch  

## 2011-02-24 ENCOUNTER — Encounter: Payer: Self-pay | Admitting: *Deleted

## 2011-03-02 NOTE — Cardiovascular Report (Signed)
  NAMEROBERTT, BUDA NO.:  [id number]  MEDICAL RECORD NO.:  [id number]  LOCATION:                                 FACILITY:  PHYSICIAN:  Vesta Mixer, M.D. DATE OF BIRTH:  05-27-1934  DATE OF PROCEDURE: DATE OF DISCHARGE:                           CARDIAC CATHETERIZATION   Willie Lynch is a 77 year old gentleman with a history of coronary artery disease.  He is status post PTCA and stenting of his right coronary artery and also status post PTCA and stenting of his left anterior descending artery.  He presents with episodes of some shortness of breath very similar to his previous myocardial infarction.  He is scheduled for cardiac catheterization for further evaluation.  The procedure was left heart catheterization with coronary angiography.  HEMODYNAMIC RESULTS:  LV pressure was 146/17.  The aortic pressure is 153/69.  ANGIOGRAPHY:  Left main:  The left main has minor luminal irregularities.  Left anterior descending artery:  The LAD has mild in-stent restenosis of the proximal and mid stent.  The vessel is very calcified.  The stenosis could be as much as 40%, but does not appear to be occlusive.  There is a small diagonal branch which has minor irregularities in the proximal segment.  The remaining LAD has minor luminal irregularities.  The left circumflex artery is a relatively small vessel.  There is a distal stenosis of approximately 60-70%.  The right coronary artery is a large and dominant vessel.  There is a very long stented segment extending from the proximal through the mid vessel.  There are minor luminal irregularities within the stent and then further down on the right coronary artery.  The posterior descending artery and the posterior lateral segment artery are normal.  The left ventriculogram was performed in the 30 RAO position.  It reveals normal left ventricular systolic function.  There are no segmental wall motion  abnormalities.  COMPLICATIONS:  None.  CONCLUSION:  Patent stents in the LAD and right coronary artery.  He will continue with medical therapy.     Vesta Mixer, M.D.     PJN/MEDQ  D:  02/20/2011  T:  02/20/2011  Job:  [id number]  cc:   Peter M. [place], M.D. Marca Ancona, MD  Electronically Signed by Kristeen Miss M.D. on 03/02/2011 07:44:56 PM

## 2011-03-06 ENCOUNTER — Encounter: Payer: Medicare Other | Admitting: Cardiology

## 2011-03-10 ENCOUNTER — Ambulatory Visit (INDEPENDENT_AMBULATORY_CARE_PROVIDER_SITE_OTHER): Payer: Medicare Other | Admitting: Cardiology

## 2011-03-10 ENCOUNTER — Telehealth: Payer: Self-pay | Admitting: Cardiology

## 2011-03-10 ENCOUNTER — Encounter: Payer: Medicare Other | Admitting: Cardiology

## 2011-03-10 ENCOUNTER — Encounter: Payer: Self-pay | Admitting: Cardiology

## 2011-03-10 DIAGNOSIS — I2589 Other forms of chronic ischemic heart disease: Secondary | ICD-10-CM

## 2011-03-10 DIAGNOSIS — R0609 Other forms of dyspnea: Secondary | ICD-10-CM

## 2011-03-10 DIAGNOSIS — I2510 Atherosclerotic heart disease of native coronary artery without angina pectoris: Secondary | ICD-10-CM

## 2011-03-10 DIAGNOSIS — E7800 Pure hypercholesterolemia, unspecified: Secondary | ICD-10-CM

## 2011-03-10 DIAGNOSIS — R0600 Dyspnea, unspecified: Secondary | ICD-10-CM

## 2011-03-10 DIAGNOSIS — R0602 Shortness of breath: Secondary | ICD-10-CM

## 2011-03-10 MED ORDER — NEBIVOLOL HCL 5 MG PO TABS: 5.0000 mg | ORAL_TABLET | Freq: Every day | ORAL | Status: DC

## 2011-03-10 NOTE — Telephone Encounter (Signed)
Patient aware the computer systems are down, pt would like for the nurse to call him back

## 2011-03-10 NOTE — Telephone Encounter (Signed)
I talked with pt. He is coming for appt today at 2:45pm.

## 2011-03-10 NOTE — Patient Instructions (Signed)
Stop coreg(carvedilol).  Start Nebivolol 5mg  daily.  Your physician has recommended that you have a pulmonary function test. Pulmonary Function Tests are a group of tests that measure how well air moves in and out of your lungs.  Your physician wants you to follow-up in: 4 months with Dr Shirlee Latch. (January 2013). You will receive a reminder letter in the mail two months in advance. If you don't receive a letter, please call our office to schedule the follow-up appointment.

## 2011-03-11 NOTE — Progress Notes (Signed)
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   Lately, he has been having trouble with exertional dyspnea.  He did an ETT-myoview in 6/12 showing no ischemia or infarction.  Echo showed EF 45-50% (stable from prior).  Dyspnea improved for a while then worsened again. This time, he had a repeat cath (8/12).  This showed nonobstructive disease with patent stents.  BNP has not been elevated.   He still feels some subjective dyspnea though he is able to walk for 30-40 minutes at a good pace.  He walks for exercise on most days.  No dyspnea with steps.  He does not report wheezing.  He stopped smoking a long time ago.    Labs (3/11): K 4.2, creatinine 0.7  Labs (4/11): LFTs nromal, LDL 43, HDL 35  Labs (10/11): HDL 34, LDL 58, LFTs normal  Labs (11/11): K 4.1, creatinine 0.7  Labs (6/12): K 4.1, creatinine 0.8, TSH normal, BNP 19 Labs (7/12): LDL 64, HDL 46 Labs (8/12): K 3.9, creatinine 0.8  Allergies:  1) ! Codeine  2) ! Morphine  3) ! Augmentin (Amoxicillin-Pot Clavulanate)   Past Medical History:  1. CAD: NSTEMI 5/10. Rotational atherectomy/PCI with Xience DES x 3 to RCA and rotational atherectomy/PCI with Xience DES to proximal LAD. ETT-myoview (6/12): 7:33, no ischemic ECG changes, EF 59%, no ischemia or infarction on perfusion images.  Left heart cath (8/12): 60-70% distal CFX, 40% in-stent restenosis in proximal LAD.   2. ABDOMINAL PAIN (ICD-789.00) -.  3. ALLERGIC RHINITIS (ICD-477.9)  4. DIVERTICULOSIS OF COLON (ICD-562.10) - last colon 11/05 by DrSamLeB w/ divertics only.  5. IRRITABLE BOWEL SYNDROME (ICD-564.1)  6. Hx of ELEVATED PROSTATE SPECIFIC ANTIGEN (ICD-790.93) - followed by Dr Vonita Moss for urology  7. Hx of RENAL CYST (ICD-593.2)  8. OSTEOARTHRITIS (ICD-715.90)  9. LOW BACK PAIN, CHRONIC (ICD-724.2) -  10. Hx of MIGRAINE HEADACHE (ICD-346.90)  11. HTN: ACEI cough.  12. Esophagitis/GERD  13. Hyperlipidemia  14. Ischemic cardiomyopathy: Mild. Echo  (8/10) with EF 45-50%, diffuse hypokinesis, mild AI and mild MR.  Echo (6/12): EF 45-50%, basal to mid inferoposterior hypokinesis, mild aortic insufficiency, grade I diastolic dysfunction.  LV-gram 8/12 with EF 55%.   Family History:  mother died in her early 60s from metastatic cancer, family believes it may have been colon cancer that was diagnosed in her 13s  Father deceased age 83 - heart disease  One sibling alive age 32  One sibling deceased age 21 - unknown  One sibling deceased age 21 -unknown   Social History:  Quit smoking 1963  Alcohol - 4 drinks per week  Exercises  Caffeine - 2 cups per day  Married  2 children  Previously worked for [employer], now Research scientist (medical).   Review of Systems  All systems reviewed and negative except as per HPI.   Current Outpatient Prescriptions  Medication Sig Dispense Refill  . alfuzosin (UROXATRAL) 10 MG 24 hr tablet Take 10 mg by mouth daily.        Marland Kitchen aspirin (ASPIR-LOW) 81 MG EC tablet Take 162 mg by mouth 2 (two) times daily.       . cetirizine (ZYRTEC ALLERGY) 10 MG tablet Take 10 mg by mouth as needed.        . CRESTOR 40 MG tablet take 1 tablet by mouth once daily  30 tablet  5  . diphenhydrAMINE (BENADRYL) 25 MG tablet Take 25 mg by mouth as needed. For  itching        . famotidine (PEPCID) 20 MG tablet Take 20 mg by mouth 2 (two) times daily.       . finasteride (PROSCAR) 5 MG tablet Take 5 mg by mouth daily.        Marland Kitchen losartan (COZAAR) 50 MG tablet Take 50 mg by mouth daily. Take 1/2 daily        . Multiple Vitamins-Minerals (MULTIVITAL) tablet Take 1 tablet by mouth daily.        . nitroGLYCERIN (NITROSTAT) 0.4 MG SL tablet Place 0.4 mg under the tongue as needed.        . Omega-3 Fatty Acids (FISH OIL) 1000 MG CAPS Take by mouth 2 (two) times daily.        Marland Kitchen PLAVIX 75 MG tablet take 1 tablet by mouth once daily  30 tablet  10  . pramipexole (MIRAPEX) 0.25 MG tablet Take 1-2 tablets by mouth at bedtime for restless leg syndrome   60 tablet  prn  . simethicone (MYLICON) 80 MG chewable tablet Chew 80 mg by mouth every 6 (six) hours as needed.        . Timolol Maleate 0.5 % (DAILY) SOLN Apply to eye. Instill one droplet in OU       . travoprost, benzalkonium, (TRAVATAN) 0.004 % ophthalmic solution Place 1 drop into both eyes at bedtime.        Marland Kitchen zolpidem (AMBIEN) 5 MG tablet Take 5 mg by mouth. 1 daily as needed for sleep       . nebivolol (BYSTOLIC) 5 MG tablet Take 1 tablet (5 mg total) by mouth daily.  30 tablet  11    BP 144/84  Pulse 75  Ht 5\' 10"  (1.778 m)  Wt 188 lb 12.8 oz (85.639 kg)  BMI 27.09 kg/m2 General: NAD Neck: No JVD, no thyromegaly or thyroid nodule.  Lungs: Clear to auscultation bilaterally with normal respiratory effort. CV: Nondisplaced PMI.  Heart regular S1/S2, no S3/S4, no murmur.  No peripheral edema.  No carotid bruit.  Normal pedal pulses.  Abdomen: Soft, nontender, no hepatosplenomegaly, no distention.  Neurologic: Alert and oriented x 3.  Psych: Normal affect. Extremities: No clubbing or cyanosis.

## 2011-03-11 NOTE — Assessment & Plan Note (Signed)
Patient continues to have mild exertional dyspnea.  He feels like he has to walk slower during his daily walks.  Left heart cath showed no obstructive disease and echo showed stable LV systolic function.  BNP was normal.  Lung exam is normal.  CXRs have been unrevealing.  One possible etiology could be bronchospasm from use of Coreg.  I will take him off Coreg and use nebivolol 5 mg daily, which is very beta-1 selective.  I will also have him get PFTs to make sure that we are not missing significant COPD or restrictive lung disease.

## 2011-03-11 NOTE — Assessment & Plan Note (Addendum)
No obstructive coronary disease on recent cath.  Continue ASA 162, Plavix, beta blocker, Crestor, and losartan.

## 2011-03-11 NOTE — Assessment & Plan Note (Signed)
EF 45-50% by echo but 55% by cath.  He will continue low dose beta blocker and ARB.

## 2011-03-11 NOTE — Assessment & Plan Note (Signed)
Lipids at goal (< 70) when last checked.  

## 2011-03-23 ENCOUNTER — Ambulatory Visit (INDEPENDENT_AMBULATORY_CARE_PROVIDER_SITE_OTHER): Payer: Medicare Other | Admitting: Internal Medicine

## 2011-03-23 DIAGNOSIS — R0600 Dyspnea, unspecified: Secondary | ICD-10-CM

## 2011-03-23 DIAGNOSIS — R0609 Other forms of dyspnea: Secondary | ICD-10-CM

## 2011-03-23 LAB — PULMONARY FUNCTION TEST

## 2011-03-23 NOTE — Progress Notes (Signed)
PFT done today. 

## 2011-03-26 ENCOUNTER — Encounter: Payer: Self-pay | Admitting: Cardiology

## 2011-06-25 ENCOUNTER — Other Ambulatory Visit: Payer: Self-pay | Admitting: Cardiology

## 2011-07-24 ENCOUNTER — Ambulatory Visit (INDEPENDENT_AMBULATORY_CARE_PROVIDER_SITE_OTHER): Payer: Medicare Other | Admitting: Cardiology

## 2011-07-24 ENCOUNTER — Encounter: Payer: Self-pay | Admitting: Cardiology

## 2011-07-24 DIAGNOSIS — E7800 Pure hypercholesterolemia, unspecified: Secondary | ICD-10-CM

## 2011-07-24 DIAGNOSIS — I2510 Atherosclerotic heart disease of native coronary artery without angina pectoris: Secondary | ICD-10-CM

## 2011-07-24 DIAGNOSIS — R0600 Dyspnea, unspecified: Secondary | ICD-10-CM

## 2011-07-24 DIAGNOSIS — R0609 Other forms of dyspnea: Secondary | ICD-10-CM

## 2011-07-24 NOTE — Patient Instructions (Signed)
Your physician recommends that you return for a FASTING lipid profile next week in the [facility] office lab.  Your physician wants you to follow-up in: 6 months with Dr Shirlee Latch. (August 2013). You will receive a reminder letter in the mail two months in advance. If you don't receive a letter, please call our office to schedule the follow-up appointment.

## 2011-07-24 NOTE — Progress Notes (Signed)
PCP: Dr. Kriste Basque  78 yo with NSTEMI in 5/10 s/p PCI to LAD and RCA.  In 2012, he had trouble with exertional dyspnea.  He did an ETT-myoview in 6/12 showing no ischemia or infarction.  Echo showed EF 45-50% (stable from prior).  Dyspnea improved for a while then worsened again. This time, he had a repeat cath (8/12).  This showed nonobstructive disease with patent stents.  BNP has not been elevated.  PFTs in 10/12 were normal.  I had him stop Coreg and start nebivolol instead with the thought that dyspnea could be related to beta-2 blockade and use of a more selective beta-1 blocker may help.    He says that since stopping Coreg and going on nebivolol, he feels back to normal. He continues to exercise most days, walking 30 minutes on a treadmill.  No significant exertional dyspnea now, no chest pain.  Systolic BP is mildly elevated today but has been running in the 110s-120s at home.   Labs (3/11): K 4.2, creatinine 0.7  Labs (4/11): LFTs nromal, LDL 43, HDL 35  Labs (10/11): HDL 34, LDL 58, LFTs normal  Labs (11/11): K 4.1, creatinine 0.7  Labs (6/12): K 4.1, creatinine 0.8, TSH normal, BNP 19 Labs (7/12): LDL 64, HDL 46 Labs (8/12): K 3.9, creatinine 0.8  Allergies:  1) ! Codeine  2) ! Morphine  3) ! Augmentin (Amoxicillin-Pot Clavulanate)   Past Medical History:  1. CAD: NSTEMI 5/10. Rotational atherectomy/PCI with Xience DES x 3 to RCA and rotational atherectomy/PCI with Xience DES to proximal LAD. ETT-myoview (6/12): 7:33, no ischemic ECG changes, EF 59%, no ischemia or infarction on perfusion images.  Left heart cath (8/12): 60-70% distal CFX, 40% in-stent restenosis in proximal LAD.   2. ABDOMINAL PAIN (ICD-789.00) -.  3. ALLERGIC RHINITIS (ICD-477.9)  4. DIVERTICULOSIS OF COLON (ICD-562.10) - last colon 11/05 by DrSamLeB w/ divertics only.  5. IRRITABLE BOWEL SYNDROME (ICD-564.1)  6. Hx of ELEVATED PROSTATE SPECIFIC ANTIGEN (ICD-790.93) - followed by Dr Vonita Moss for urology  7. Hx of  RENAL CYST (ICD-593.2)  8. OSTEOARTHRITIS (ICD-715.90)  9. LOW BACK PAIN, CHRONIC (ICD-724.2) -  10. Hx of MIGRAINE HEADACHE (ICD-346.90)  11. HTN: ACEI cough.  12. Esophagitis/GERD  13. Hyperlipidemia  14. Ischemic cardiomyopathy: Mild. Echo (8/10) with EF 45-50%, diffuse hypokinesis, mild AI and mild MR.  Echo (6/12): EF 45-50%, basal to mid inferoposterior hypokinesis, mild aortic insufficiency, grade I diastolic dysfunction.  LV-gram 8/12 with EF 55%.  15. Dyspnea: PFTs normal.  Possible intolerance to Coreg.   Family History:  mother died in her early 70s from metastatic cancer, family believes it may have been colon cancer that was diagnosed in her 48s  Father deceased age 26 - heart disease  One sibling alive age 98  One sibling deceased age 34 - unknown  One sibling deceased age 85 -unknown   Social History:  Quit smoking 1963  Alcohol - 4 drinks per week  Exercises  Caffeine - 2 cups per day  Married  2 children  Previously worked for [employer], now Research scientist (medical).    Current Outpatient Prescriptions  Medication Sig Dispense Refill  . alfuzosin (UROXATRAL) 10 MG 24 hr tablet Take 10 mg by mouth daily.        Marland Kitchen aspirin (ASPIR-LOW) 81 MG EC tablet Take 162 mg by mouth 2 (two) times daily.       . cetirizine (ZYRTEC ALLERGY) 10 MG tablet Take 10 mg by mouth as needed.        Marland Kitchen  CRESTOR 40 MG tablet take 1 tablet by mouth once daily  30 tablet  5  . diphenhydrAMINE (BENADRYL) 25 MG tablet Take 25 mg by mouth as needed. For itching        . famotidine (PEPCID) 20 MG tablet Take 20 mg by mouth 2 (two) times daily.       . finasteride (PROSCAR) 5 MG tablet Take 5 mg by mouth daily.        Marland Kitchen losartan (COZAAR) 50 MG tablet Take 25 mg by mouth daily.       . Multiple Vitamins-Minerals (MULTIVITAL) tablet Take 1 tablet by mouth daily.        . nebivolol (BYSTOLIC) 10 MG tablet Take 10 mg by mouth daily.      . nitroGLYCERIN (NITROSTAT) 0.4 MG SL tablet Place 0.4 mg under the  tongue as needed.        . Omega-3 Fatty Acids (FISH OIL) 1000 MG CAPS Take by mouth 2 (two) times daily.        Marland Kitchen PLAVIX 75 MG tablet take 1 tablet by mouth once daily  30 tablet  10  . pramipexole (MIRAPEX) 0.25 MG tablet Take 1-2 tablets by mouth at bedtime for restless leg syndrome  60 tablet  prn  . simethicone (MYLICON) 80 MG chewable tablet Chew 80 mg by mouth every 6 (six) hours as needed.        . Timolol Maleate 0.5 % (DAILY) SOLN Apply to eye. Instill one droplet in OU       . travoprost, benzalkonium, (TRAVATAN) 0.004 % ophthalmic solution Place 1 drop into both eyes at bedtime.        Marland Kitchen zolpidem (AMBIEN) 5 MG tablet Take 5 mg by mouth. 1 daily as needed for sleep         There were no vitals taken for this visit. General: NAD Neck: No JVD, no thyromegaly or thyroid nodule.  Lungs: Clear to auscultation bilaterally with normal respiratory effort. CV: Nondisplaced PMI.  Heart regular S1/S2, no S3/S4, no murmur.  No peripheral edema.  No carotid bruit.  Normal pedal pulses.  Abdomen: Soft, nontender, no hepatosplenomegaly, no distention.  Neurologic: Alert and oriented x 3.  Psych: Normal affect. Extremities: No clubbing or cyanosis.

## 2011-07-24 NOTE — Assessment & Plan Note (Signed)
Possibly related to Coreg.  Improved considerably with stopping Coreg and starting more selective beta-1 blocker (nebivolol).

## 2011-07-24 NOTE — Assessment & Plan Note (Signed)
No obstructive coronary disease on recent cath.  Continue ASA 162, Plavix, beta blocker, Crestor, and losartan.  

## 2011-07-24 NOTE — Assessment & Plan Note (Signed)
Check lipids/LFTs with goal LDL < 70.  

## 2011-07-29 ENCOUNTER — Other Ambulatory Visit: Payer: Medicare Other

## 2011-07-30 ENCOUNTER — Other Ambulatory Visit (INDEPENDENT_AMBULATORY_CARE_PROVIDER_SITE_OTHER): Payer: Medicare Other

## 2011-07-30 DIAGNOSIS — I2510 Atherosclerotic heart disease of native coronary artery without angina pectoris: Secondary | ICD-10-CM

## 2011-07-30 LAB — LIPID PANEL
Cholesterol: 101 mg/dL (ref 0–200)
HDL: 36.7 mg/dL — ABNORMAL LOW (ref >39.00)
Triglycerides: 116 mg/dL (ref 0.0–149.0)

## 2011-08-20 ENCOUNTER — Telehealth: Payer: Self-pay | Admitting: Cardiology

## 2011-08-20 NOTE — Telephone Encounter (Signed)
I talked with pt's wife. Pt scheduled for a dental procedure 08/31/11--wife not sure exactly what the procedure is. Pt has been asked by dentist to ask Dr Shirlee Latch if OK to hold Plavix prior to procedure. Pt's wife will ask dentist to fax Dr Shirlee Latch the name of procedure.

## 2011-08-20 NOTE — Telephone Encounter (Signed)
Please return call to patient wife Willie Lynch regarding patient's upcoming dental procedure.  She can be reached at hm# [phone]. Procedure is Monday August 31, 2011

## 2011-08-21 NOTE — Telephone Encounter (Signed)
Received fax from Dr Henriette Combs office. Pt to have tooth extraction 08/31/11. Per Dr Wilmon Pali to hold Plavix for 5 days, continue aspirin. Pt's wife is aware. Fax form  from Dr Laural Benes returned to HIM to be faxed back to Dr Laural Benes with this information.

## 2011-10-27 ENCOUNTER — Other Ambulatory Visit: Payer: Self-pay | Admitting: Internal Medicine

## 2012-01-08 ENCOUNTER — Ambulatory Visit (INDEPENDENT_AMBULATORY_CARE_PROVIDER_SITE_OTHER): Payer: Medicare Other | Admitting: Cardiology

## 2012-01-08 ENCOUNTER — Encounter: Payer: Self-pay | Admitting: Cardiology

## 2012-01-08 VITALS — BP 120/78 | HR 53 | Ht 70.0 in | Wt 184.0 lb

## 2012-01-08 DIAGNOSIS — I2510 Atherosclerotic heart disease of native coronary artery without angina pectoris: Secondary | ICD-10-CM

## 2012-01-08 DIAGNOSIS — R0609 Other forms of dyspnea: Secondary | ICD-10-CM

## 2012-01-08 DIAGNOSIS — E7800 Pure hypercholesterolemia, unspecified: Secondary | ICD-10-CM

## 2012-01-08 DIAGNOSIS — N529 Male erectile dysfunction, unspecified: Secondary | ICD-10-CM

## 2012-01-08 DIAGNOSIS — R0600 Dyspnea, unspecified: Secondary | ICD-10-CM

## 2012-01-08 MED ORDER — SILDENAFIL CITRATE 100 MG PO TABS: 100.0000 mg | ORAL_TABLET | Freq: Every day | ORAL | Status: DC | PRN

## 2012-01-08 MED ORDER — LOSARTAN POTASSIUM 25 MG PO TABS: 25.0000 mg | ORAL_TABLET | Freq: Every day | ORAL | Status: DC

## 2012-01-08 NOTE — Patient Instructions (Addendum)
Start losartan 25mg  daily.  Use viagra 100mg  as needed. DO NOT TAKE NITROGLYCERIN WITHIN 24 HOURS OF TAKING VIAGRA.  Your physician recommends that you return for a FASTING lipid profile /BMET in August 2013.  Your physician wants you to follow-up in: 6 months with Dr Shirlee Latch. (January 2014).  You will receive a reminder letter in the mail two months in advance. If you don't receive a letter, please call our office to schedule the follow-up appointment.

## 2012-01-08 NOTE — Assessment & Plan Note (Signed)
Nonexertional dyspnea.  I am not sure what is causing this.  Exercise tolerance is unchanged.  Possible that it could be due to reflux.  He is not sure if there is any relationship to meals.  I have asked him to see if it is at all related to meals, in that case we could do a trial of Protonix (avoid other PPIs with use of Plavix).

## 2012-01-08 NOTE — Assessment & Plan Note (Signed)
Check lipids in 8/13, goal LDL < 70.

## 2012-01-08 NOTE — Progress Notes (Signed)
Patient ID: Willie Lynch, male   DOB: [birth year], 78 y.o.   MRN: [id number] PCP: Dr. Kriste Basque  78 yo with NSTEMI in 5/10 s/p PCI to LAD and RCA.  In 2012, he had trouble with exertional dyspnea.  He did an ETT-myoview in 6/12 showing no ischemia or infarction.  Echo showed EF 45-50% (stable from prior).  Dyspnea improved for a while then worsened again. This time, he had a repeat cath (8/12).  This showed nonobstructive disease with patent stents.  BNP has not been elevated.  PFTs in 10/12 were normal.  I had him stop Coreg and start nebivolol instead with the thought that dyspnea could be related to beta-2 blockade and use of a more selective beta-1 blocker may help.  This seemed to considerably help his dyspnea.   This summer, [name]'s dyspnea has returned somewhat but not nearly as bad as last year.  It is not exertional.  He will just feel like he has to take a deep breath on occasion.  This happens at rest.  His exercise tolerance is unchanged.  He walks on the treadmill 5 days a week and does not note exertional dyspnea.  No chest pain.  He is off losartan and is not sure why.  He thinks he just ran out.   ECG: NSR, normal  Labs (3/11): K 4.2, creatinine 0.7  Labs (4/11): LFTs nromal, LDL 43, HDL 35  Labs (10/11): HDL 34, LDL 58, LFTs normal  Labs (11/11): K 4.1, creatinine 0.7  Labs (6/12): K 4.1, creatinine 0.8, TSH normal, BNP 19 Labs (7/12): LDL 64, HDL 46 Labs (8/12): K 3.9, creatinine 0.8 Labs (2/13): LDL 41, HDL 23  Allergies:  1) ! Codeine  2) ! Morphine  3) ! Augmentin (Amoxicillin-Pot Clavulanate)   Past Medical History:  1. CAD: NSTEMI 5/10. Rotational atherectomy/PCI with Xience DES x 3 to RCA and rotational atherectomy/PCI with Xience DES to proximal LAD. ETT-myoview (6/12): 7:33, no ischemic ECG changes, EF 59%, no ischemia or infarction on perfusion images.  Left heart cath (8/12): 60-70% distal CFX, 40% in-stent restenosis in proximal LAD.   2. ABDOMINAL PAIN  (ICD-789.00) -.  3. ALLERGIC RHINITIS (ICD-477.9)  4. DIVERTICULOSIS OF COLON (ICD-562.10) - last colon 11/05 by DrSamLeB w/ divertics only.  5. IRRITABLE BOWEL SYNDROME (ICD-564.1)  6. Hx of ELEVATED PROSTATE SPECIFIC ANTIGEN (ICD-790.93) - followed by Dr Vonita Moss for urology  7. Hx of RENAL CYST (ICD-593.2)  8. OSTEOARTHRITIS (ICD-715.90)  9. LOW BACK PAIN, CHRONIC (ICD-724.2) -  10. Hx of MIGRAINE HEADACHE (ICD-346.90)  11. HTN: ACEI cough.  12. Esophagitis/GERD  13. Hyperlipidemia  14. Ischemic cardiomyopathy: Mild. Echo (8/10) with EF 45-50%, diffuse hypokinesis, mild AI and mild MR.  Echo (6/12): EF 45-50%, basal to mid inferoposterior hypokinesis, mild aortic insufficiency, grade I diastolic dysfunction.  LV-gram 8/12 with EF 55%.  15. Dyspnea: PFTs normal.  Possible intolerance to Coreg.   Family History:  mother died in her early 65s from metastatic cancer, family believes it may have been colon cancer that was diagnosed in her 63s  Father deceased age 30 - heart disease  One sibling alive age 20  One sibling deceased age 75 - unknown  One sibling deceased age 65 -unknown   Social History:  Quit smoking 1963  Alcohol - 4 drinks per week  Exercises  Caffeine - 2 cups per day  Married  2 children  Previously worked for [employer], now Research scientist (medical).   ROS: All systems reviewed  and negative except as per HPI.    Current Outpatient Prescriptions  Medication Sig Dispense Refill  . alfuzosin (UROXATRAL) 10 MG 24 hr tablet Take 10 mg by mouth daily.        Marland Kitchen aspirin (ASPIR-LOW) 81 MG EC tablet Take 162 mg by mouth 2 (two) times daily.       . cetirizine (ZYRTEC ALLERGY) 10 MG tablet Take 10 mg by mouth as needed.        . clopidogrel (PLAVIX) 75 MG tablet take 1 tablet by mouth once daily  30 tablet  10  . CRESTOR 40 MG tablet take 1 tablet by mouth once daily  30 tablet  5  . diphenhydrAMINE (BENADRYL) 25 MG tablet Take 25 mg by mouth as needed. For itching        .  famotidine (PEPCID) 20 MG tablet Take 20 mg by mouth 2 (two) times daily.       . finasteride (PROSCAR) 5 MG tablet Take 5 mg by mouth daily.        . Multiple Vitamins-Minerals (MULTIVITAL) tablet Take 1 tablet by mouth daily.        . nebivolol (BYSTOLIC) 10 MG tablet Take 10 mg by mouth daily.      . nitroGLYCERIN (NITROSTAT) 0.4 MG SL tablet Place 0.4 mg under the tongue as needed.        . Omega-3 Fatty Acids (FISH OIL) 1000 MG CAPS Take by mouth 2 (two) times daily.        . simethicone (MYLICON) 80 MG chewable tablet Chew 80 mg by mouth every 6 (six) hours as needed.        . Timolol Maleate 0.5 % (DAILY) SOLN Apply to eye. Instill one droplet in OU       . travoprost, benzalkonium, (TRAVATAN) 0.004 % ophthalmic solution Place 1 drop into both eyes at bedtime.        Marland Kitchen losartan (COZAAR) 25 MG tablet Take 1 tablet (25 mg total) by mouth daily.  30 tablet  6  . pramipexole (MIRAPEX) 0.25 MG tablet Take 1-2 tablets by mouth at bedtime for restless leg syndrome  60 tablet  prn  . sildenafil (VIAGRA) 100 MG tablet Take 1 tablet (100 mg total) by mouth daily as needed for erectile dysfunction.  10 tablet  0    BP 120/78  Pulse 53  Ht 5\' 10"  (1.778 m)  Wt 184 lb (83.462 kg)  BMI 26.40 kg/m2 General: NAD Neck: No JVD, no thyromegaly or thyroid nodule.  Lungs: Clear to auscultation bilaterally with normal respiratory effort. CV: Nondisplaced PMI.  Heart regular S1/S2, no S3/S4, no murmur.  No peripheral edema.  No carotid bruit.  Normal pedal pulses.  Abdomen: Soft, nontender, no hepatosplenomegaly, no distention.  Neurologic: Alert and oriented x 3.  Psych: Normal affect. Extremities: No clubbing or cyanosis.

## 2012-01-08 NOTE — Assessment & Plan Note (Signed)
No obstructive coronary disease on recent cath.  Continue ASA 162, Plavix, beta blocker, Crestor.  I will have him restart losartan at 25 mg daily for secondary prevention.  He will get a BMET in 8/13.

## 2012-01-08 NOTE — Assessment & Plan Note (Signed)
Willie Lynch states that Viagra 50 mg is not working.  He can try 100 mg.  He knows about not taking NTG around the time of Viagra use.

## 2012-01-13 ENCOUNTER — Other Ambulatory Visit: Payer: Self-pay | Admitting: Cardiology

## 2012-02-02 ENCOUNTER — Other Ambulatory Visit: Payer: Medicare Other

## 2012-02-02 ENCOUNTER — Telehealth: Payer: Self-pay | Admitting: Cardiology

## 2012-02-02 NOTE — Telephone Encounter (Signed)
Patient called was told spoke to DOD Dr.Cooper he advised do not have to hold plavix for a filling and cleaning.

## 2012-02-02 NOTE — Telephone Encounter (Signed)
New msg Pt called and has to have dental work and needs to go off plavix and blood thinner

## 2012-02-02 NOTE — Telephone Encounter (Signed)
Spoke to patient he stated he needs to have dental work done next week and wants to know if ok to hold plavix.Patient was told Dr.McLean out of office this week, will speak to DOD Dr.Cooper and call him back.

## 2012-02-04 ENCOUNTER — Other Ambulatory Visit (INDEPENDENT_AMBULATORY_CARE_PROVIDER_SITE_OTHER): Payer: Medicare Other

## 2012-02-04 DIAGNOSIS — I2510 Atherosclerotic heart disease of native coronary artery without angina pectoris: Secondary | ICD-10-CM

## 2012-02-04 DIAGNOSIS — E7800 Pure hypercholesterolemia, unspecified: Secondary | ICD-10-CM

## 2012-02-04 LAB — LIPID PANEL
HDL: 42.2 mg/dL (ref >39.00)
LDL Cholesterol: 45 mg/dL (ref 0–99)
Total CHOL/HDL Ratio: 3
VLDL: 20.8 mg/dL (ref 0.0–40.0)

## 2012-02-04 LAB — BASIC METABOLIC PANEL
Calcium: 8.8 mg/dL (ref 8.4–10.5)
GFR: 124.54 mL/min (ref >60.00)
Glucose, Bld: 101 mg/dL — ABNORMAL HIGH (ref 70–99)
Sodium: 140 mEq/L (ref 135–145)

## 2012-03-04 ENCOUNTER — Other Ambulatory Visit: Payer: Self-pay | Admitting: Cardiology

## 2012-05-10 ENCOUNTER — Other Ambulatory Visit: Payer: Self-pay | Admitting: Pulmonary Disease

## 2012-05-10 ENCOUNTER — Telehealth: Payer: Self-pay | Admitting: Pulmonary Disease

## 2012-05-10 MED ORDER — PRAMIPEXOLE DIHYDROCHLORIDE 0.25 MG PO TABS: ORAL_TABLET | ORAL | Status: DC

## 2012-05-10 NOTE — Telephone Encounter (Signed)
Called and spoke with pt and he is aware of refills that have been sent to the pharmacy.  Pt has scheduled an appt with SN in jan 2014.  Nothing further is needed

## 2012-07-04 ENCOUNTER — Ambulatory Visit (INDEPENDENT_AMBULATORY_CARE_PROVIDER_SITE_OTHER): Payer: Medicare Other | Admitting: Cardiology

## 2012-07-04 ENCOUNTER — Encounter: Payer: Self-pay | Admitting: Cardiology

## 2012-07-04 VITALS — BP 116/70 | HR 56 | Ht 70.0 in | Wt 187.0 lb

## 2012-07-04 DIAGNOSIS — R0600 Dyspnea, unspecified: Secondary | ICD-10-CM

## 2012-07-04 DIAGNOSIS — R0989 Other specified symptoms and signs involving the circulatory and respiratory systems: Secondary | ICD-10-CM

## 2012-07-04 DIAGNOSIS — E7800 Pure hypercholesterolemia, unspecified: Secondary | ICD-10-CM

## 2012-07-04 DIAGNOSIS — I2510 Atherosclerotic heart disease of native coronary artery without angina pectoris: Secondary | ICD-10-CM

## 2012-07-04 NOTE — Progress Notes (Signed)
Patient ID: NAYEF COLLEGE, male   DOB: 07-05-1933, 79 y.o.   MRN: [id number] PCP: Dr. Kriste Basque  79 yo with NSTEMI in 5/10 s/p PCI to LAD and RCA.  In 2012, he had trouble with exertional dyspnea.  He did an ETT-myoview in 6/12 showing no ischemia or infarction.  Echo showed EF 45-50% (stable from prior).  Dyspnea improved for a while then worsened again. This time, he had a repeat cath (8/12).  This showed nonobstructive disease with patent stents.  BNP has not been elevated.  PFTs in 10/12 were normal.  I had him stop Coreg and start nebivolol instead with the thought that dyspnea could be related to beta-2 blockade and use of a more selective beta-1 blocker may help.  This seemed to help his dyspnea.   Lately, Mr Rama has been doing well.  He is still short of breath if he walks fast up steps but is able to exercise on the treadmill and elliptical with no problems.  He plays golf without dyspnea.  No chest pain.    ECG: NSR, normal  Labs (3/11): K 4.2, creatinine 0.7  Labs (4/11): LFTs nromal, LDL 43, HDL 35  Labs (10/11): HDL 34, LDL 58, LFTs normal  Labs (11/11): K 4.1, creatinine 0.7  Labs (6/12): K 4.1, creatinine 0.8, TSH normal, BNP 19 Labs (7/12): LDL 64, HDL 46 Labs (8/12): K 3.9, creatinine 0.8 Labs (2/13): LDL 41, HDL 23 Labs (8/13): LDL 45, HDL 42, K 3.9, creatinine 0.7  Allergies:  1) ! Codeine  2) ! Morphine  3) ! Augmentin (Amoxicillin-Pot Clavulanate)   Past Medical History:  1. CAD: NSTEMI 5/10. Rotational atherectomy/PCI with Xience DES x 3 to RCA and rotational atherectomy/PCI with Xience DES to proximal LAD. ETT-myoview (6/12): 7:33, no ischemic ECG changes, EF 59%, no ischemia or infarction on perfusion images.  Left heart cath (8/12): 60-70% distal CFX, 40% in-stent restenosis in proximal LAD.   2. ABDOMINAL PAIN (ICD-789.00) -.  3. ALLERGIC RHINITIS (ICD-477.9)  4. DIVERTICULOSIS OF COLON (ICD-562.10) - last colon 11/05 by DrSamLeB w/ divertics only.  5. IRRITABLE  BOWEL SYNDROME (ICD-564.1)  6. Hx of ELEVATED PROSTATE SPECIFIC ANTIGEN (ICD-790.93) - followed by Dr Vonita Moss for urology  7. Hx of RENAL CYST (ICD-593.2)  8. OSTEOARTHRITIS (ICD-715.90)  9. LOW BACK PAIN, CHRONIC (ICD-724.2) -  10. Hx of MIGRAINE HEADACHE (ICD-346.90)  11. HTN: ACEI cough.  12. Esophagitis/GERD  13. Hyperlipidemia  14. Ischemic cardiomyopathy: Mild. Echo (8/10) with EF 45-50%, diffuse hypokinesis, mild AI and mild MR.  Echo (6/12): EF 45-50%, basal to mid inferoposterior hypokinesis, mild aortic insufficiency, grade I diastolic dysfunction.  LV-gram 8/12 with EF 55%.  15. Dyspnea: PFTs normal.  Possible intolerance to Coreg.   Family History:  mother died in her early 69s from metastatic cancer, family believes it may have been colon cancer that was diagnosed in her 31s  Father deceased age 42 - heart disease  One sibling alive age 10  One sibling deceased age 48 - unknown  One sibling deceased age 73 -unknown   Social History:  Quit smoking 1963  Alcohol - 4 drinks per week  Exercises  Caffeine - 2 cups per day  Married  2 children  Previously worked for [employer], now Research scientist (medical).    Current Outpatient Prescriptions  Medication Sig Dispense Refill  . alfuzosin (UROXATRAL) 10 MG 24 hr tablet Take 10 mg by mouth daily.        Marland Kitchen aspirin (ASPIR-LOW) 81  MG EC tablet 2 tablets (total 162mg ) daily      . BYSTOLIC 5 MG tablet take 1 tablet by mouth once daily  30 each  6  . cetirizine (ZYRTEC ALLERGY) 10 MG tablet Take 10 mg by mouth as needed.        . clopidogrel (PLAVIX) 75 MG tablet take 1 tablet by mouth once daily  30 tablet  10  . CRESTOR 40 MG tablet take 1 tablet by mouth once daily  30 tablet  5  . diphenhydrAMINE (BENADRYL) 25 MG tablet Take 25 mg by mouth as needed. For itching        . famotidine (PEPCID) 20 MG tablet Take 20 mg by mouth 2 (two) times daily.       . finasteride (PROSCAR) 5 MG tablet Take 5 mg by mouth daily.        Marland Kitchen losartan  (COZAAR) 25 MG tablet Take 1 tablet (25 mg total) by mouth daily.  30 tablet  6  . Multiple Vitamins-Minerals (MULTIVITAL) tablet Take 1 tablet by mouth daily.        . nitroGLYCERIN (NITROSTAT) 0.4 MG SL tablet Place 0.4 mg under the tongue as needed.        . Omega-3 Fatty Acids (FISH OIL) 1000 MG CAPS Take by mouth 2 (two) times daily.        . pramipexole (MIRAPEX) 0.25 MG tablet Take 1-2 tablets by mouth at bedtime for restless leg syndrome  60 tablet  2  . sildenafil (VIAGRA) 100 MG tablet Take 1 tablet (100 mg total) by mouth daily as needed for erectile dysfunction.  10 tablet  0  . simethicone (MYLICON) 80 MG chewable tablet Chew 80 mg by mouth every 6 (six) hours as needed.        . Timolol Maleate 0.5 % (DAILY) SOLN Apply to eye. Instill one droplet in OU       . travoprost, benzalkonium, (TRAVATAN) 0.004 % ophthalmic solution Place 1 drop into both eyes at bedtime.          BP 116/70  Pulse 56  Ht 5\' 10"  (1.778 m)  Wt 187 lb (84.823 kg)  BMI 26.83 kg/m2 General: NAD Neck: No JVD, no thyromegaly or thyroid nodule.  Lungs: Clear to auscultation bilaterally with normal respiratory effort. CV: Nondisplaced PMI.  Heart regular S1/S2, no S3/S4, no murmur.  No peripheral edema.  No carotid bruit.  Normal pedal pulses.  Abdomen: Soft, nontender, no hepatosplenomegaly, no distention.  Neurologic: Alert and oriented x 3.  Psych: Normal affect. Extremities: No clubbing or cyanosis.   Assessment/Plan  CORONARY ATHEROSCLEROSIS NATIVE CORONARY ARTERY  No obstructive coronary disease on recent cath. Continue ASA 162 (can decrease to once daily), Plavix, beta blocker, Crestor, losartan.  Dyspnea  Chronic stable dyspnea.  It does not really interfere with his daily activities but continues to bother him.  He had improvement when he switched his beta blocker to nebivolol.  I will have him do a trial of stopping nebivolol for 4 days.  If his dyspnea improves, he can stay off it.    HYPERCHOLESTEROLEMIA Good lipids in 8/13, continue Crestor 40.   Marca Ancona 07/04/2012 12:12 PM

## 2012-07-04 NOTE — Patient Instructions (Addendum)
Your physician wants you to follow-up in: 1 year with Dr Shirlee Latch. (January 2015). You will receive a reminder letter in the mail two months in advance. If you don't receive a letter, please call our office to schedule the follow-up appointment.   Do not take Bystolic for 3 days to see if this helps your breathing. If it does help your breathing, you can stop taking it. If you stop taking it call this office to let Dr Shirlee Latch know that you are stopping it. [phone].  Decrease aspirin to 162mg   daily.

## 2012-07-08 ENCOUNTER — Ambulatory Visit (INDEPENDENT_AMBULATORY_CARE_PROVIDER_SITE_OTHER): Payer: Medicare Other | Admitting: Pulmonary Disease

## 2012-07-08 ENCOUNTER — Telehealth: Payer: Self-pay | Admitting: Cardiology

## 2012-07-08 ENCOUNTER — Encounter: Payer: Self-pay | Admitting: Pulmonary Disease

## 2012-07-08 VITALS — BP 118/70 | HR 68 | Temp 97.7°F | Ht 70.0 in | Wt 190.6 lb

## 2012-07-08 DIAGNOSIS — I10 Essential (primary) hypertension: Secondary | ICD-10-CM

## 2012-07-08 DIAGNOSIS — M1990 Unspecified osteoarthritis, unspecified site: Secondary | ICD-10-CM

## 2012-07-08 DIAGNOSIS — G2581 Restless legs syndrome: Secondary | ICD-10-CM

## 2012-07-08 DIAGNOSIS — K219 Gastro-esophageal reflux disease without esophagitis: Secondary | ICD-10-CM | POA: Insufficient documentation

## 2012-07-08 DIAGNOSIS — E7800 Pure hypercholesterolemia, unspecified: Secondary | ICD-10-CM

## 2012-07-08 DIAGNOSIS — R972 Elevated prostate specific antigen [PSA]: Secondary | ICD-10-CM

## 2012-07-08 DIAGNOSIS — K589 Irritable bowel syndrome without diarrhea: Secondary | ICD-10-CM

## 2012-07-08 DIAGNOSIS — M545 Low back pain: Secondary | ICD-10-CM

## 2012-07-08 DIAGNOSIS — J309 Allergic rhinitis, unspecified: Secondary | ICD-10-CM

## 2012-07-08 DIAGNOSIS — K5730 Diverticulosis of large intestine without perforation or abscess without bleeding: Secondary | ICD-10-CM

## 2012-07-08 DIAGNOSIS — I2510 Atherosclerotic heart disease of native coronary artery without angina pectoris: Secondary | ICD-10-CM

## 2012-07-08 MED ORDER — PRAMIPEXOLE DIHYDROCHLORIDE 0.25 MG PO TABS: ORAL_TABLET | ORAL | Status: DC

## 2012-07-08 NOTE — Telephone Encounter (Signed)
New Problem:    Patient called in to say that he has been off of the BYSTOLIC 5 MG tablet and has noticed a positive change and he will continue to stay off of it.  Please call back if you have any questions.

## 2012-07-08 NOTE — Telephone Encounter (Signed)
Ok, stay off it.

## 2012-07-08 NOTE — Patient Instructions (Addendum)
Today we updated your med list in our EPIC system...    Continue your current medications the same...    We refilled the Mirapex as requested...  We gave you the 2013 Flu vaccine today...  Call for any questions or if we can be of service in any way...  Let's plan a follow up visit in 1 year, sooner if needed for any problems.Marland KitchenMarland Kitchen

## 2012-07-08 NOTE — Telephone Encounter (Signed)
Pt advised,verbalized understanding. 

## 2012-07-08 NOTE — Telephone Encounter (Signed)
I will forward to Dr McLean. 

## 2012-07-10 ENCOUNTER — Encounter: Payer: Self-pay | Admitting: Pulmonary Disease

## 2012-07-10 NOTE — Progress Notes (Signed)
Subjective:    Patient ID: Willie Lynch, male    DOB: 10-20-1933, 79 y.o.   MRN: [id number]  HPI 79 y/o WM whom I last saw in 2009 for a routine follow up... He has a hx CAD- NSTEMI 5/10 w/ PCI to LAD & RCA, & followed by DrMcLean on ASA, Plavix, Losartan; and Hyperchol on Cres40...  ~  December 09, 2010:  >44yr ROV & there is a lot to get caught up on- SEE PROB LIST BELOW for details... His CC is RLS & he wants Mirapex Rx but DrMcLean wouldn't write it so he called here for a prescription but hadn't been seen in >62yrs...  He reports prob w/ LBP- eval by DrOlin w/ shots in his back from [facility] & improved...  He is c/o no energy/fatigue & some incr in SOB ever since returning from a trip to [facility] (he has been forced to decr his exercise program)> we discussed the need for Cards f/u in light of his cardiomyopathy, CAD, & normal f/u labs (Labs today w/ normal chems, cbc, tsh, etc)...  Also notes stomach "queezy"/indigestion & he has some gas> we discussed increasing Pepcid to Bid & adding Mylicon for the gas...  ~  July 08, 2012:  20mo ROV & Willie Lynch reports doing satis x for recent incr SOB that Cards thought was related to his BBlocker therapy; he reports breathing better off the Bystolic...  We reviewed the following medical problems during today's office visit >>      AR> on Zyrtek; he has mild seasonal symptoms... Note: PFTs & CXR were wnl...    HBP> on Losartan25 & off prev Bystolic5 due to dyspnea which appears better off this med; Hx of ACE cough in past; BP= 118/70 & he denies CP, palpit, SOB, edema; Bmet wnl as well...    CAD, s/pNSTEMI> on ASA81 & Plavix75; he had f/u DrMcLean 1/14 & they stopped BBlockers w/ some improvement in dyspnea...    Hyperlipid> on Cres40 + FishOil; last FLP 8/13 showed TChol 108, TG 104, HDL 42, LDL 45    GERD> on Pepcid20Bid; he denies abd pain, n/v, c/d, blood seen...    Divertics, IBS> followed by DrJacobs and he has never had a polyp but +FamHx colon ca in his  mother; f/u colon due 2015...    Hx BPH, elev PSA, ED> on Uroxatrol10, Proscar5, Viagra100; followed by DrEskridge; seen 10/13 and stable w/ PSA reported normal per pt...    DJD, LBP> on OTC analgesics as needed; he is followed by [facility]- DrRamos w/ shots in his back periodically which really help per pt...    RLS> on Mirapex0.25 which he takes "more for [place] than myself"; med refilled today...    Insomnia> on Benedryl25 prn...  We reviewed prob list, meds, xrays and labs> see below for updates >> OK 2013 Flu vaccine today... Requests refill Mirapex...         Problem List:   GLAUCOMA>  On eye drops from DrMcCuen...  ALLERGIC RHINITIS (ICD-477.9) - on ZYRTEK 10mg  prn, Benedryl prn... ~  CXR 8/12 showed borderline heart size, ectasia & calcif thorAo; clear lungs, mild degen spondylosis, prev right shoulder surg... ~  PFTs 10/12 done for dyspnea:  FVC=4.26 (100%); FEV1=3.17 (114%); %1sec=74; mid-flows=99%pred; Lung vols & DLCO are both wnl as well...  Hx HBP w/ ACE cough>  On LOSARTAN 50mg /d & COREG 6.25mg  Bid...  BP= 120/68 & he denies CP or palpit, but as noted is c/o no energy/ feeling poorly/ "  indigestion" etc. ~  Last CXR 5/10 showed tort thor Ao, clear lungs, post op changes right shoulder ~  2DEcho 6/12 showed norm wall thickness, reduced EF=45-50% w/ HK basal, inferior, lateral walls; Gr1DD, mild AI...  CAD/ NSTEMI 5/10 w/ PCI LAD & RCA>  Followed regularly by DrMcLean for Cards on ASA 81mg /d, PLAVIX 75mg /d, plus the Coreg, Losartan, Crestor, NTG prn. Ischemic Cardiomyopathy w/ diffuse HK on Echo 8/10 & EF= 45-50%, mild AI/ MR... ~  Hosp 5/10 by DrMcLean/ Cards> CAD & NSTEMI w/ Rotational atherectomy/PCI with Xience DES x 3 to RCA and rotational atherectomy/PCI with Xience DES to proximal LAD.  ~  EKG 6/12 showed SBrady, rate 58/min, otherw WNL... ~  1/14: Cards f/u by DrMcLean> hx NSTEMI 5/10 w/ PCI to LAD & RCA; c/o exertional dyspnea> ETT-Myoview 6/12 was neg w/o ischemia or  infarction; 2DEcho 6/12 w/ EF=45-50% (no ch from prev); repeat cath 8/12 showed nonobstructive dis w/ patent stents; Dyspnea ultimately believed due to BBlockers as he had difficulty w/ Coreg & Bystolic- better off these meds... EKG showed SBrady, rate56, wnl, NAD...  HYPERLIPIDEMIA>  On diet + CRESTOR 40mg /d and FISH OIL 1000mg  Bid... ~  FLP 10/11 on Cres40 showed TChol 112, TG 100, HDL 34, LDL 58 ~  FLP 7/12 on Cres40 showed TChol 123, TG 68, HDL 46, LDL 64 ~  FLP 8/13 on Cres40 showed TChol 108, TG 104, HDL 42, LDL 45   GERD/ Esophagitis>  On PEPCID which he takes just prn & not on PPI due to his Plavix rx... ~  Labs 6/12 showed Hg= 14.7, MCV= 93...  DIVERTICULOSIS OF COLON (ICD-562.10) - last colon 11/05 by DrSamLeB w/ divertics only... He saw DrJacobs 10/11 & they decided on f/u colon in 2015 despite family hx of colon cancer in mother since he has never had a polyp etc... Hx of IRRITABLE BOWEL SYNDROME (ICD-564.1) - he denies recent abd pain, n/v, change in bowels, etc...  Hx of ELEVATED PROSTATE SPECIFIC ANTIGEN (ICD-790.93) - followed by DrPeterson for Urology on UROXATRAL 10mg /d, & PROSCAR 5mg /d;  He tells me that DrPeterson follows his PSAs, we do not have any recent notes from Urology. Hx of RENAL CYST (ICD-593.2) ~  10/13: he had f/u DrEskridge> BPH w/ BOO, elev PSA, ED; stable on Uroxatrol10 & Proscar5; he tells me his PSA was "fine"...  OSTEOARTHRITIS (ICD-715.90) LOW BACK PAIN, CHRONIC (ICD-724.2) - severe back discomfort w/ eval by ortho (DrOlin & Ramos) resulting in epid steroid shots, a round of prednisone, and percocet + advil for pain...   Hx of MIGRAINE HEADACHE (ICD-346.90) - he denies recent migraine problem...  INSOMNIA>  On BENEDRYL 25mg  prn use...  HEALTH MAINTENANCE: ~  GI:  Dr Christella Hartigan follows & f/u colon due 2015... ~  GU:  Followed by DrPeterson & he does his PSAs... ~  Immunizations:  He received Shingles vaccine in 2011   Past Medical History  Diagnosis  Date  . CAD (coronary artery disease)     NSTEMI 5/10. Rotational atherectomy/PCI w Xience DES x 3 to RCA and rotation atherectomy /PCI w Xience DES to prox LAD  . Abdominal pain, unspecified site   . Allergic rhinitis, cause unspecified   . Diverticulosis of colon (without mention of hemorrhage)     last colon 11/05 by DrSamLeB w divertics only  . Irritable bowel syndrome   . Elevated prostate specific antigen (PSA)     followed by Dr Vonita Moss for urology  . Acquired cyst of kidney   .  Osteoarthrosis, unspecified whether generalized or localized, unspecified site   . Lumbago   . Migraine, unspecified, without mention of intractable migraine without mention of status migrainosus   . Hypertension     ACEI cough  . GERD (gastroesophageal reflux disease)     esophagitis  . Hyperlipidemia   . Ischemic cardiomyopathy     mild echo (8/10) w EF 45-50%, diffuse hypokinesis, mild AI and mild MR    Past Surgical History  Procedure Date  . Rotator cuff surgery     Dr. Simonne Come  . Bilat inguinal hernia repairs 7/06    Dr Daphine Deutscher      Outpatient Encounter Prescriptions as of 07/08/2012  Medication Sig Dispense Refill  . alfuzosin (UROXATRAL) 10 MG 24 hr tablet Take 10 mg by mouth daily.        Marland Kitchen aspirin (ASPIR-LOW) 81 MG EC tablet 2 tablets (total 162mg ) daily      . cetirizine (ZYRTEC ALLERGY) 10 MG tablet Take 10 mg by mouth as needed.        . clopidogrel (PLAVIX) 75 MG tablet take 1 tablet by mouth once daily  30 tablet  10  . CRESTOR 40 MG tablet take 1 tablet by mouth once daily  30 tablet  5  . diphenhydrAMINE (BENADRYL) 25 MG tablet Take 25 mg by mouth as needed. For itching        . famotidine (PEPCID) 20 MG tablet Take 20 mg by mouth 2 (two) times daily.       . finasteride (PROSCAR) 5 MG tablet Take 5 mg by mouth daily.        Marland Kitchen losartan (COZAAR) 25 MG tablet Take 1 tablet (25 mg total) by mouth daily.  30 tablet  6  . Multiple Vitamins-Minerals (MULTIVITAL) tablet Take 1  tablet by mouth daily.        . nitroGLYCERIN (NITROSTAT) 0.4 MG SL tablet Place 0.4 mg under the tongue as needed.        . Omega-3 Fatty Acids (FISH OIL) 1000 MG CAPS Take by mouth 2 (two) times daily.        . pramipexole (MIRAPEX) 0.25 MG tablet Take 1-2 tablets by mouth at bedtime for restless leg syndrome  60 tablet  5  . sildenafil (VIAGRA) 100 MG tablet Take 1 tablet (100 mg total) by mouth daily as needed for erectile dysfunction.  10 tablet  0  . simethicone (MYLICON) 80 MG chewable tablet Chew 80 mg by mouth every 6 (six) hours as needed.        . Timolol Maleate 0.5 % (DAILY) SOLN Apply to eye. Instill one droplet in OU       . travoprost, benzalkonium, (TRAVATAN) 0.004 % ophthalmic solution Place 1 drop into both eyes at bedtime.        . [DISCONTINUED] pramipexole (MIRAPEX) 0.25 MG tablet Take 1-2 tablets by mouth at bedtime for restless leg syndrome  60 tablet  2  . BYSTOLIC 5 MG tablet take 1 tablet by mouth once daily  30 each  6    Allergies  Allergen Reactions  . Amoxicillin-Pot Clavulanate     REACTION: pt developed HIVES on augmentin  . Codeine   . Morphine    ACE inhibitors:  Lisinopril w/ cough...     Current Medications, Allergies, Past Medical History, Past Surgical History, Family History, and Social History were reviewed in [employer] record.    Review of Systems       See  HPI - all other systems neg except as noted...      The patient denies anorexia, fever, weight loss, weight gain, vision loss, decreased hearing, hoarseness, chest pain, syncope, peripheral edema, prolonged cough, hemoptysis, abdominal pain, melena, hematochezia, severe indigestion/heartburn, hematuria, incontinence, muscle weakness, suspicious skin lesions, transient blindness, difficulty walking, depression, unusual weight change, abnormal bleeding, enlarged lymph nodes, and angioedema.     Objective:   Physical Exam    WD, WN, 79 y/o WM in NAD...  GENERAL:   Alert & oriented; pleasant & cooperative. HEENT:  [place]/AT, EOM-wnl, PERRLA, EACs-clear, TMs-wnl, NOSE-clear, THROAT-clear & wnl. NECK:  Supple w/ full ROM; no JVD; normal carotid impulses w/o bruits; no thyromegaly or nodules palpated; no lymphadenopathy. CHEST:  Clear to P & A; without wheezes/ rales/ or rhonchi. HEART:  Regular Rhythm; without murmurs/ rubs/ or gallops. ABDOMEN:  Soft & nontender; normal bowel sounds; no organomegaly or masses detected. EXT: without deformities, mild arthritic changes; no varicose veins/ venous insuffic/ or edema. NEURO:  CN's intact; motor testing normal; sensory testing normal; gait normal & balance OK. DERM:  No lesions noted; no rash etc...  RADIOLOGY DATA:  Reviewed in the EPIC EMR & discussed w/ the patient...  LABORATORY DATA:  Reviewed in the EPIC EMR & discussed w/ the patient...   Assessment & Plan:    RLS>  He wants Mirapex Rx & we filled Rx for 0.25mg  1-2 Qhs ...  GI Symptoms/ GERD/ IBS/ Gas>  Followed by [employer], rec that he incr his Pepcid to Bid & add Mylicon, other Simethacone containing gas therapies...  CAD/ s/p NSTEMI 5/10 w/ PTCA & stents/ Ischemic Cardiomyopathy>  As above, he has had incr symptoms since ret from trip to [facility] & needs Cards f/u...  CHOL>  Stable on the Cres40...  GU>  Hx elev PSA>  All followed by DrPeterson...  LBP/ DJD>  Followed by DrOlin & Ramos...   Patient's Medications  New Prescriptions   No medications on file  Previous Medications   ALFUZOSIN (UROXATRAL) 10 MG 24 HR TABLET    Take 10 mg by mouth daily.     ASPIRIN (ASPIR-LOW) 81 MG EC TABLET    2 tablets (total 162mg ) daily   BYSTOLIC 5 MG TABLET    take 1 tablet by mouth once daily   CETIRIZINE (ZYRTEC ALLERGY) 10 MG TABLET    Take 10 mg by mouth as needed.     CLOPIDOGREL (PLAVIX) 75 MG TABLET    take 1 tablet by mouth once daily   CRESTOR 40 MG TABLET    take 1 tablet by mouth once daily   DIPHENHYDRAMINE (BENADRYL) 25 MG TABLET    Take 25  mg by mouth as needed. For itching     FAMOTIDINE (PEPCID) 20 MG TABLET    Take 20 mg by mouth 2 (two) times daily.    FINASTERIDE (PROSCAR) 5 MG TABLET    Take 5 mg by mouth daily.     LOSARTAN (COZAAR) 25 MG TABLET    Take 1 tablet (25 mg total) by mouth daily.   MULTIPLE VITAMINS-MINERALS (MULTIVITAL) TABLET    Take 1 tablet by mouth daily.     NITROGLYCERIN (NITROSTAT) 0.4 MG SL TABLET    Place 0.4 mg under the tongue as needed.     OMEGA-3 FATTY ACIDS (FISH OIL) 1000 MG CAPS    Take by mouth 2 (two) times daily.     SILDENAFIL (VIAGRA) 100 MG TABLET    Take 1 tablet (  100 mg total) by mouth daily as needed for erectile dysfunction.   SIMETHICONE (MYLICON) 80 MG CHEWABLE TABLET    Chew 80 mg by mouth every 6 (six) hours as needed.     TIMOLOL MALEATE 0.5 % (DAILY) SOLN    Apply to eye. Instill one droplet in OU    TRAVOPROST, BENZALKONIUM, (TRAVATAN) 0.004 % OPHTHALMIC SOLUTION    Place 1 drop into both eyes at bedtime.    Modified Medications   Modified Medication Previous Medication   PRAMIPEXOLE (MIRAPEX) 0.25 MG TABLET pramipexole (MIRAPEX) 0.25 MG tablet      Take 1-2 tablets by mouth at bedtime for restless leg syndrome    Take 1-2 tablets by mouth at bedtime for restless leg syndrome  Discontinued Medications   No medications on file

## 2012-07-19 ENCOUNTER — Other Ambulatory Visit: Payer: Self-pay | Admitting: Cardiology

## 2012-08-17 ENCOUNTER — Other Ambulatory Visit: Payer: Self-pay | Admitting: Cardiology

## 2012-10-16 ENCOUNTER — Other Ambulatory Visit: Payer: Self-pay | Admitting: Cardiology

## 2013-02-07 ENCOUNTER — Other Ambulatory Visit: Payer: Self-pay

## 2013-02-07 MED ORDER — ROSUVASTATIN CALCIUM 40 MG PO TABS: ORAL_TABLET | ORAL | Status: DC

## 2013-03-10 ENCOUNTER — Other Ambulatory Visit: Payer: Self-pay | Admitting: Cardiology

## 2013-05-02 ENCOUNTER — Other Ambulatory Visit (HOSPITAL_COMMUNITY): Payer: Self-pay | Admitting: Internal Medicine

## 2013-05-02 ENCOUNTER — Other Ambulatory Visit: Payer: Self-pay | Admitting: Pulmonary Disease

## 2013-05-15 ENCOUNTER — Other Ambulatory Visit: Payer: Self-pay | Admitting: Cardiology

## 2013-06-05 ENCOUNTER — Encounter: Payer: Self-pay | Admitting: Gastroenterology

## 2013-06-07 ENCOUNTER — Encounter: Payer: Self-pay | Admitting: Gastroenterology

## 2013-06-28 ENCOUNTER — Ambulatory Visit: Payer: Medicare Other | Admitting: Cardiology

## 2013-06-29 ENCOUNTER — Encounter: Payer: Self-pay | Admitting: Cardiology

## 2013-06-29 ENCOUNTER — Ambulatory Visit (INDEPENDENT_AMBULATORY_CARE_PROVIDER_SITE_OTHER): Payer: Medicare Other | Admitting: Cardiology

## 2013-06-29 VITALS — BP 124/72 | HR 71 | Ht 71.0 in | Wt 189.0 lb

## 2013-06-29 DIAGNOSIS — I2510 Atherosclerotic heart disease of native coronary artery without angina pectoris: Secondary | ICD-10-CM

## 2013-06-29 DIAGNOSIS — R0989 Other specified symptoms and signs involving the circulatory and respiratory systems: Secondary | ICD-10-CM

## 2013-06-29 DIAGNOSIS — R0600 Dyspnea, unspecified: Secondary | ICD-10-CM

## 2013-06-29 DIAGNOSIS — R0609 Other forms of dyspnea: Secondary | ICD-10-CM

## 2013-06-29 DIAGNOSIS — E7800 Pure hypercholesterolemia, unspecified: Secondary | ICD-10-CM

## 2013-06-29 DIAGNOSIS — E782 Mixed hyperlipidemia: Secondary | ICD-10-CM

## 2013-06-29 NOTE — Patient Instructions (Signed)
Decrease bystolic to 2.5mg  daily.  Your physician recommends that you have a lipid profile /BMET/CBCd today.  Your physician wants you to follow-up in: 1 year with Dr Aundra Dubin. (January 2016). You will receive a reminder letter in the mail two months in advance. If you don't receive a letter, please call our office to schedule the follow-up appointment.

## 2013-06-30 ENCOUNTER — Other Ambulatory Visit: Payer: Self-pay | Admitting: *Deleted

## 2013-06-30 LAB — CBC WITH DIFFERENTIAL/PLATELET
BASOS ABS: 0 10*3/uL (ref 0.0–0.1)
BASOS PCT: 0.7 % (ref 0.0–3.0)
EOS ABS: 0.1 10*3/uL (ref 0.0–0.7)
Eosinophils Relative: 2.5 % (ref 0.0–5.0)
HEMATOCRIT: 40.7 % (ref 39.0–52.0)
HEMOGLOBIN: 14 g/dL (ref 13.0–17.0)
LYMPHS ABS: 1.4 10*3/uL (ref 0.7–4.0)
Lymphocytes Relative: 26.3 % (ref 12.0–46.0)
MCHC: 34.4 g/dL (ref 30.0–36.0)
MCV: 90.6 fl (ref 78.0–100.0)
Monocytes Absolute: 0.6 10*3/uL (ref 0.1–1.0)
Monocytes Relative: 10.9 % (ref 3.0–12.0)
NEUTROS ABS: 3.2 10*3/uL (ref 1.4–7.7)
Neutrophils Relative %: 59.6 % (ref 43.0–77.0)
Platelets: 99 10*3/uL — ABNORMAL LOW (ref 150.0–400.0)
RBC: 4.49 Mil/uL (ref 4.22–5.81)
RDW: 13.9 % (ref 11.5–14.6)
WBC: 5.4 10*3/uL (ref 4.5–10.5)

## 2013-06-30 LAB — BASIC METABOLIC PANEL
BUN: 17 mg/dL (ref 6–23)
CALCIUM: 9 mg/dL (ref 8.4–10.5)
CO2: 25 meq/L (ref 19–32)
CREATININE: 1 mg/dL (ref 0.4–1.5)
Chloride: 107 mEq/L (ref 96–112)
GFR: 79.56 mL/min (ref >60.00)
Glucose, Bld: 107 mg/dL — ABNORMAL HIGH (ref 70–99)
Potassium: 3.9 mEq/L (ref 3.5–5.1)
Sodium: 137 mEq/L (ref 135–145)

## 2013-06-30 LAB — LIPID PANEL
CHOLESTEROL: 136 mg/dL (ref 0–200)
HDL: 31.3 mg/dL — ABNORMAL LOW (ref >39.00)
LDL Cholesterol: 69 mg/dL (ref 0–99)
TRIGLYCERIDES: 180 mg/dL — AB (ref 0.0–149.0)
Total CHOL/HDL Ratio: 4
VLDL: 36 mg/dL (ref 0.0–40.0)

## 2013-06-30 MED ORDER — NEBIVOLOL HCL 5 MG PO TABS: 2.5000 mg | ORAL_TABLET | Freq: Every day | ORAL | Status: DC

## 2013-06-30 NOTE — Progress Notes (Signed)
Patient ID: Willie Lynch, male   DOB: 02-Dec-1933, 80 y.o.   MRN: [id number] PCP: Willie. Lenna Gilford  80 yo with NSTEMI in 5/10 s/p PCI to LAD and RCA.  In 2012, he had trouble with exertional dyspnea.  He did an ETT-myoview in 6/12 showing no ischemia or infarction.  Echo showed EF 45-50% (stable from prior).  Dyspnea improved for a while then worsened again. This time, he had a repeat cath (8/12).  This showed nonobstructive disease with patent stents.  BNP has not been elevated.  PFTs in 10/12 were normal.  I had him stop Coreg and start nebivolol instead with the thought that dyspnea could be related to beta-2 blockade and use of a more selective beta-1 blocker may help.  This seemed to help his dyspnea.   Lately, Willie Lynch has been doing well.  He is still short of breath if he walks fast up steps but is able to exercise on the treadmill and elliptical for 30 minutes with no problems.  He does this 5-6 days/week. He plays golf without dyspnea.  No chest pain.  He does have some fatigue and wants to cut back on his medications.   ECG: NSR, normal  Labs (3/11): K 4.2, creatinine 0.7  Labs (4/11): LFTs nromal, LDL 43, HDL 35  Labs (10/11): HDL 34, LDL 58, LFTs normal  Labs (11/11): K 4.1, creatinine 0.7  Labs (6/12): K 4.1, creatinine 0.8, TSH normal, BNP 19 Labs (7/12): LDL 64, HDL 46 Labs (8/12): K 3.9, creatinine 0.8 Labs (2/13): LDL 41, HDL 23 Labs (8/13): LDL 45, HDL 42, K 3.9, creatinine 0.7  Allergies:  1) ! Codeine  2) ! Morphine  3) ! Augmentin (Amoxicillin-Pot Clavulanate)   Past Medical History:  1. CAD: NSTEMI 5/10. Rotational atherectomy/PCI with Xience DES x 3 to RCA and rotational atherectomy/PCI with Xience DES to proximal LAD. ETT-myoview (6/12): 7:33, no ischemic ECG changes, EF 59%, no ischemia or infarction on perfusion images.  Left heart cath (8/12): 60-70% distal CFX, 40% in-stent restenosis in proximal LAD.   2. ABDOMINAL PAIN (ICD-789.00) -.  3. ALLERGIC RHINITIS  (ICD-477.9)  4. DIVERTICULOSIS OF COLON (ICD-562.10) - last colon 11/05 by DrSamLeB w/ divertics only.  5. IRRITABLE BOWEL SYNDROME (ICD-564.1)  6. Hx of ELEVATED PROSTATE SPECIFIC ANTIGEN (ICD-790.93) - followed by Willie Lynch for urology  7. Hx of RENAL CYST (ICD-593.2)  8. OSTEOARTHRITIS (ICD-715.90)  9. LOW BACK PAIN, CHRONIC (ICD-724.2) -  10. Hx of MIGRAINE HEADACHE (ICD-346.90)  11. HTN: ACEI cough.  12. Esophagitis/GERD  13. Hyperlipidemia  14. Ischemic cardiomyopathy: Mild. Echo (8/10) with EF 45-50%, diffuse hypokinesis, mild AI and mild Willie.  Echo (6/12): EF 45-50%, basal to mid inferoposterior hypokinesis, mild aortic insufficiency, grade I diastolic dysfunction.  LV-gram 8/12 with EF 55%.  15. Dyspnea: PFTs normal.  Possible intolerance to Coreg.   Family History:  mother died in her early 51s from metastatic cancer, family believes it may have been colon cancer that was diagnosed in her 20s  Father deceased age 66 - heart disease  One sibling alive age 76  One sibling deceased age 6 - unknown  One sibling deceased age 81 -unknown   Social History:  Quit smoking 1963  Alcohol - 4 drinks per week  Exercises  Caffeine - 2 cups per day  Married  2 children  Previously worked for [employer], now Optometrist.    Current Outpatient Prescriptions  Medication Sig Dispense Refill  . alfuzosin (UROXATRAL) 10  MG 24 hr tablet Take 10 mg by mouth daily.        Marland Kitchen aspirin (ASPIR-LOW) 81 MG EC tablet 2 tablets (total 162mg ) daily      . cetirizine (ZYRTEC ALLERGY) 10 MG tablet Take 10 mg by mouth as needed.        . clopidogrel (PLAVIX) 75 MG tablet take 1 tablet by mouth once daily  30 tablet  10  . diphenhydrAMINE (BENADRYL) 25 MG tablet Take 25 mg by mouth as needed. For itching        . famotidine (PEPCID) 20 MG tablet Take 20 mg by mouth 2 (two) times daily.       . finasteride (PROSCAR) 5 MG tablet Take 5 mg by mouth daily.        Marland Kitchen losartan (COZAAR) 25 MG tablet take  1 tablet by mouth once daily  30 tablet  1  . Multiple Vitamins-Minerals (MULTIVITAL) tablet Take 1 tablet by mouth daily.        Marland Kitchen NITROSTAT 0.4 MG SL tablet DISSOLVE 1 TABLET UNDER THE TONGUE IF NEEDED AS DIRECTED.  25 tablet  3  . Omega-3 Fatty Acids (FISH OIL) 1000 MG CAPS Take by mouth 2 (two) times daily.        . pramipexole (MIRAPEX) 0.25 MG tablet take 1 to 2 tablets by mouth at bedtime for RESTLESS LEG SYNDROME  60 tablet  5  . rosuvastatin (CRESTOR) 40 MG tablet take 1 tablet by mouth once daily  90 tablet  3  . sildenafil (VIAGRA) 100 MG tablet Take 1 tablet (100 mg total) by mouth daily as needed for erectile dysfunction.  10 tablet  0  . Timolol Maleate 0.5 % (DAILY) SOLN Apply to eye. Instill one droplet in OU       . travoprost, benzalkonium, (TRAVATAN) 0.004 % ophthalmic solution Place 1 drop into both eyes at bedtime.        . nebivolol (BYSTOLIC) 5 MG tablet Take 0.5 tablets (2.5 mg total) by mouth daily.  15 tablet  5   No current facility-administered medications for this visit.    BP 124/72  Pulse 71  Ht 5\' 11"  (1.803 m)  Wt 85.73 kg (189 lb)  BMI 26.37 kg/m2 General: NAD Neck: No JVD, no thyromegaly or thyroid nodule.  Lungs: Clear to auscultation bilaterally with normal respiratory effort. CV: Nondisplaced PMI.  Heart regular S1/S2, no S3/S4, no murmur.  No peripheral edema.  No carotid bruit.  Normal pedal pulses.  Abdomen: Soft, nontender, no hepatosplenomegaly, no distention.  Neurologic: Alert and oriented x 3.  Psych: Normal affect. Extremities: No clubbing or cyanosis.   Assessment/Plan  CORONARY ATHEROSCLEROSIS NATIVE CORONARY ARTERY  No obstructive coronary disease on recent cath. Continue ASA 81, Plavix, beta blocker, Crestor, losartan.  He wants to back off on his meds if he can.  I told him that he can try cutting nebivolol in half to 2.5 mg daily.  Dyspnea  This has mostly resolved.  HYPERCHOLESTEROLEMIA Good lipids in 8/13, continue Crestor 40,  will check lipids today.   Loralie Champagne 06/30/2013 11:14 AM

## 2013-07-14 ENCOUNTER — Ambulatory Visit (INDEPENDENT_AMBULATORY_CARE_PROVIDER_SITE_OTHER): Payer: Medicare Other | Admitting: Gastroenterology

## 2013-07-14 ENCOUNTER — Encounter: Payer: Self-pay | Admitting: Gastroenterology

## 2013-07-14 VITALS — BP 130/78 | HR 79 | Ht 71.0 in | Wt 188.2 lb

## 2013-07-14 DIAGNOSIS — Z1211 Encounter for screening for malignant neoplasm of colon: Secondary | ICD-10-CM

## 2013-07-14 NOTE — Progress Notes (Signed)
HPI: This is a   very pleasant 80 year old man is here to discuss colon cancer screening.  He underwent colonoscopy with Dr. Velora Heckler 2005. Diverticulosis was found no polyps. He was recommended to have repeat colonoscopy in 5 years and annual Hemoccult tests checked.   No FH of colon cancer.  4 stents in place for CAD, plavix for years.  30 min excersice 5-6 days per week, golf periodically.  No changes in his bowels, no bleeding no changes.  Overall his weight has been stable.  Cbc 2 weeks ago he is not anemic    Review of systems: Pertinent positive and negative review of systems were noted in the above HPI section. Complete review of systems was performed and was otherwise normal.    Past Medical History  Diagnosis Date  . CAD (coronary artery disease)     NSTEMI 5/10. Rotational atherectomy/PCI w Xience DES x 3 to RCA and rotation atherectomy /PCI w Xience DES to prox LAD  . Abdominal pain, unspecified site   . Allergic rhinitis, cause unspecified   . Diverticulosis of colon (without mention of hemorrhage)     last colon 11/05 by DrSamLeB w divertics only  . Irritable bowel syndrome   . Elevated prostate specific antigen (PSA)     followed by Dr Terance Hart for urology  . Acquired cyst of kidney   . Osteoarthrosis, unspecified whether generalized or localized, unspecified site   . Lumbago   . Migraine, unspecified, without mention of intractable migraine without mention of status migrainosus   . Hypertension     ACEI cough  . GERD (gastroesophageal reflux disease)     esophagitis  . Hyperlipidemia   . Ischemic cardiomyopathy     mild echo (8/10) w EF 45-50%, diffuse hypokinesis, mild AI and mild MR    Past Surgical History  Procedure Laterality Date  . Rotator cuff surgery      Dr. Shellia Carwin  . Bilat inguinal hernia repairs  7/06    Dr Hassell Done  . Angioplasty      Current Outpatient Prescriptions  Medication Sig Dispense Refill  . alfuzosin (UROXATRAL) 10 MG  24 hr tablet Take 10 mg by mouth daily.        Marland Kitchen aspirin (ASPIR-LOW) 81 MG EC tablet 2 tablets (total 162mg ) daily      . cetirizine (ZYRTEC ALLERGY) 10 MG tablet Take 10 mg by mouth as needed.        . clopidogrel (PLAVIX) 75 MG tablet take 1 tablet by mouth once daily  30 tablet  10  . diphenhydrAMINE (BENADRYL) 25 MG tablet Take 25 mg by mouth as needed. For itching        . famotidine (PEPCID) 20 MG tablet Take 20 mg by mouth 2 (two) times daily.       . finasteride (PROSCAR) 5 MG tablet Take 5 mg by mouth daily.        Marland Kitchen losartan (COZAAR) 25 MG tablet take 1 tablet by mouth once daily  30 tablet  1  . Multiple Vitamins-Minerals (MULTIVITAL) tablet Take 1 tablet by mouth daily.        . nebivolol (BYSTOLIC) 5 MG tablet Take 0.5 tablets (2.5 mg total) by mouth daily.  15 tablet  5  . NITROSTAT 0.4 MG SL tablet DISSOLVE 1 TABLET UNDER THE TONGUE IF NEEDED AS DIRECTED.  25 tablet  3  . Omega-3 Fatty Acids (FISH OIL) 1000 MG CAPS Take by mouth 2 (two) times daily.        Marland Kitchen  pramipexole (MIRAPEX) 0.25 MG tablet take 1 to 2 tablets by mouth at bedtime for RESTLESS LEG SYNDROME  60 tablet  5  . rosuvastatin (CRESTOR) 40 MG tablet take 1 tablet by mouth once daily  90 tablet  3  . sildenafil (VIAGRA) 100 MG tablet Take 1 tablet (100 mg total) by mouth daily as needed for erectile dysfunction.  10 tablet  0  . Timolol Maleate 0.5 % (DAILY) SOLN Apply to eye. Instill one droplet in OU       . travoprost, benzalkonium, (TRAVATAN) 0.004 % ophthalmic solution Place 1 drop into both eyes at bedtime.         No current facility-administered medications for this visit.    Allergies as of 07/14/2013 - Review Complete 07/14/2013  Allergen Reaction Noted  . Amoxicillin-pot clavulanate    . Codeine    . Morphine      Family History  Problem Relation Age of Onset  . Heart disease Father   . Cancer      ?? not sure what kind    History   Social History  . Marital Status: Married    Spouse Name:  Mathis Cashman    Number of Children: 2  . Years of Education: N/A   Occupational History  . Optometrist   . previously worked for [employer] History Main Topics  . Smoking status: Former Smoker    Quit date: 06/22/1961  . Smokeless tobacco: Never Used  . Alcohol Use: Yes     Comment: 4 drinks per week   . Drug Use: No  . Sexual Activity: Not on file   Other Topics Concern  . Not on file   Social History Narrative   Quit smoking 1963. Exercises. Caffeine- 2 cups per day. Previously worked for [employer], now Optometrist.        Physical Exam: BP 130/78  Pulse 79  Ht 5\' 11"  (1.803 m)  Wt 188 lb 3.2 oz (85.367 kg)  BMI 26.26 kg/m2  SpO2 98% Constitutional: generally well-appearing Psychiatric: alert and oriented x3 Eyes: extraocular movements intact Mouth: oral pharynx moist, no lesions Neck: supple no lymphadenopathy Cardiovascular: heart regular rate and rhythm Lungs: clear to auscultation bilaterally Abdomen: soft, nontender, nondistended, no obvious ascites, no peritoneal signs, normal bowel sounds Extremities: no lower extremity edema bilaterally Skin: no lesions on visible extremities    Assessment and plan: 80 y.o. male with  routine risk for colon cancer, elevated risk for bleeding during the procedure given his ongoing Plavix use  We had a very nice discussion about colon cancer screening in fact it generally colon cancer screening and between the age of 4 and 32. He is quite vibrant for his age of 26 but he has no alarm symptoms, no changes in his bowels and he is frankly not interested in colon cancer screening unless it is absolutely necessary. I explained him that if he has any changes or significant problems with his GI tract i would be happy to talk with him again however at this point we will suspend further colon cancer screening.

## 2013-07-14 NOTE — Patient Instructions (Signed)
Please call if you have any changes in your bowels, bleeding or concerning GI symptoms.

## 2013-07-18 ENCOUNTER — Other Ambulatory Visit: Payer: Self-pay | Admitting: Cardiology

## 2013-09-22 ENCOUNTER — Other Ambulatory Visit: Payer: Self-pay | Admitting: Cardiology

## 2013-11-07 ENCOUNTER — Telehealth: Payer: Self-pay | Admitting: Pulmonary Disease

## 2013-11-07 NOTE — Telephone Encounter (Signed)
[id number] x1 

## 2013-11-07 NOTE — Telephone Encounter (Signed)
Called and spoke with pt and he stated that he would like to come in and see SN.  appt scheduled for pt for 9:30 on 5/20.  Pt is aware and nothing further is needed.

## 2013-11-08 ENCOUNTER — Ambulatory Visit (INDEPENDENT_AMBULATORY_CARE_PROVIDER_SITE_OTHER): Payer: Medicare Other | Admitting: Pulmonary Disease

## 2013-11-08 ENCOUNTER — Encounter: Payer: Self-pay | Admitting: Pulmonary Disease

## 2013-11-08 VITALS — BP 116/66 | HR 65 | Temp 97.4°F | Ht 70.0 in | Wt 182.4 lb

## 2013-11-08 DIAGNOSIS — M545 Low back pain, unspecified: Secondary | ICD-10-CM

## 2013-11-08 DIAGNOSIS — I2589 Other forms of chronic ischemic heart disease: Secondary | ICD-10-CM

## 2013-11-08 DIAGNOSIS — I2510 Atherosclerotic heart disease of native coronary artery without angina pectoris: Secondary | ICD-10-CM

## 2013-11-08 DIAGNOSIS — N400 Enlarged prostate without lower urinary tract symptoms: Secondary | ICD-10-CM

## 2013-11-08 DIAGNOSIS — K219 Gastro-esophageal reflux disease without esophagitis: Secondary | ICD-10-CM

## 2013-11-08 DIAGNOSIS — I10 Essential (primary) hypertension: Secondary | ICD-10-CM

## 2013-11-08 DIAGNOSIS — M1990 Unspecified osteoarthritis, unspecified site: Secondary | ICD-10-CM

## 2013-11-08 DIAGNOSIS — K5730 Diverticulosis of large intestine without perforation or abscess without bleeding: Secondary | ICD-10-CM

## 2013-11-08 DIAGNOSIS — G2581 Restless legs syndrome: Secondary | ICD-10-CM

## 2013-11-08 DIAGNOSIS — E7800 Pure hypercholesterolemia, unspecified: Secondary | ICD-10-CM

## 2013-11-08 DIAGNOSIS — K589 Irritable bowel syndrome without diarrhea: Secondary | ICD-10-CM

## 2013-11-08 MED ORDER — ALPRAZOLAM 0.5 MG PO TBDP: ORAL_TABLET | ORAL | Status: DC

## 2013-11-08 MED ORDER — PRAMIPEXOLE DIHYDROCHLORIDE 0.5 MG PO TABS: ORAL_TABLET | ORAL | Status: DC

## 2013-11-08 MED ORDER — ALPRAZOLAM 0.5 MG PO TABS: ORAL_TABLET | ORAL | Status: DC

## 2013-11-08 NOTE — Patient Instructions (Signed)
Today we updated your med list in our EPIC system...    Continue your current medications the same...  We decided to increase the MIRAPEX to 0.5mg  tabs- take 1-2 at bedtime for the restless leg symptoms...  We also wrote for some Xanax 0.5mg  tabs- take 1/2 to 1 tab up to three times daily as needed...  Call for any questions or if we can be of service in any way.Marland KitchenMarland KitchenMarland Kitchen

## 2013-11-08 NOTE — Progress Notes (Signed)
Subjective:    Patient ID: Willie Lynch, male    DOB: August 16, 1933, 80 y.o.   MRN: [id number]  HPI 80 y/o WM whom I last saw in 2009 for a routine follow up... He has a hx CAD- NSTEMI 5/10 w/ PCI to LAD & RCA, & followed by DrMcLean on ASA, Plavix, Losartan; and Hyperchol on Cres40...  ~  December 09, 2010:  >46yr ROV & there is a lot to get caught up on- SEE PROB LIST BELOW for details... His CC is RLS & he wants Mirapex Rx but DrMcLean wouldn't write it so he called here for a prescription but hadn't been seen in >48yrs...  He reports prob w/ LBP- eval by DrOlin w/ shots in his back from [facility] & improved...  He is c/o no energy/fatigue & some incr in SOB ever since returning from a trip to [facility] (he has been forced to decr his exercise program)> we discussed the need for Cards f/u in light of his cardiomyopathy, CAD, & normal f/u labs (Labs today w/ normal chems, cbc, tsh, etc)...  Also notes stomach "queezy"/indigestion & he has some gas> we discussed increasing Pepcid to Bid & adding Mylicon for the gas...  ~  July 08, 2012:  73mo ROV & Arvle reports doing satis x for recent incr SOB that Cards thought was related to his BBlocker therapy; he reports breathing better off the Bystolic...  We reviewed the following medical problems during today's office visit >>     AR> on Zyrtek; he has mild seasonal symptoms... Note: PFTs & CXR were wnl...    HBP> on Losartan25 & off prev Bystolic5 due to dyspnea which appears better off this med; Hx of ACE cough in past; BP= 118/70 & he denies CP, palpit, SOB, edema; Bmet wnl as well...    CAD, s/pNSTEMI> on ASA81 & Plavix75; he had f/u DrMcLean 1/14 & they stopped BBlockers w/ some improvement in dyspnea...    Hyperlipid> on Cres40 + FishOil; last FLP 8/13 showed TChol 108, TG 104, HDL 42, LDL 45    GERD> on Pepcid20Bid; he denies abd pain, n/v, c/d, blood seen...    Divertics, IBS> followed by DrJacobs and he has never had a polyp but +FamHx colon ca in his  mother; f/u colon due 2015...    Hx BPH, elev PSA, ED> on Uroxatrol10, Proscar5, Viagra100; followed by DrEskridge; seen 10/13 and stable w/ PSA reported normal per pt...    DJD, LBP> on OTC analgesics as needed; he is followed by Danise Mina- DrRamos w/ shots in his back periodically which really help per pt...    RLS> on Mirapex0.25 which he takes "more for [place] than myself"; med refilled today...    Insomnia> on Benedryl25 prn... We reviewed prob list, meds, xrays and labs> see below for updates >> OK 2013 Flu vaccine today... Requests refill Mirapex...  ~  Nov 08, 2013:  78mo ROV & Jathniel's wife Savva Adinolfi died last week in [facility], he is quite tearful but processing his loss quite appropriately; he notes incr RLS symptoms & we discussed incr in his Mirapex to 0.5mg - 1to2 tabs qhs as needed, and he requests something for his nerves- try Xanax0.5,g 1/2-1 tab Tid prn... We reviewed the following medical problems during today's office visit >>     AR> on Zyrtek; he has mild seasonal symptoms... Note: PFTs & CXR were wnl...    HBP> on [id number] & back on Bystolic5-1/2tab/d; Hx of ACE cough in past; BP=  116/66 & he denies CP, palpit, SOB, edema...    CAD, s/pNSTEMI> on ASA81 & Plavix75; he had f/u DrMcLean 1/15> Hx NSTEMI w/ PCI to LAD & RCA in 2010; Myoview 6/12 was neg for ischemia & 2DEcho showed EF=45-50% (stable, no change); repeat cath 8/12 showed patent stents and nonobstructive dis...    Hyperlipid> on Cres40 + FishOil; last FLP 1/15 showed TChol 136, TG 180, HDL 31, LDL 69    GERD> on Pepcid20Bid; he denies abd pain, n/v, c/d, blood seen...    Divertics, IBS> followed by DrJacobs and he has never had a polyp but +FamHx colon ca in his mother; f/u colon due 2015...    Hx BPH, elev PSA, ED> on Uroxatrol10, Proscar5, Viagra100; followed by DrEskridge; seen 10/13 and stable w/ PSA reported normal per pt...    DJD, LBP> on OTC analgesics as needed; he is followed by Danise Mina- DrRamos w/  shots in his back periodically which really help per pt...    RLS> on Mirapex0.25 taking 1-2 Qhs but notes incr symptoms and we will double to Mirapex0.5mg - 1-2Qhs for symptoms...    Insomnia> on Benedryl25 prn; he's been axious since the passing of his wife 5/15- add Xanax 0.5mg  1/2 to 1 tab tid prn... We reviewed prob list, meds, xrays and labs> see below for updates >>   EKG 1/15 showed NSR, rate71, wnl, NAD...   LABS 1/15:  FLP- ok on Cres40 but TG=180 & HDL=31, advised low fat diet & incr exercise; Chems- wnl;  CBC- wnl...          Problem List:   GLAUCOMA>  On eye drops from [place]...  ALLERGIC RHINITIS (ICD-477.9) - on ZYRTEK 10mg  prn, Benedryl prn... ~  CXR 8/12 showed borderline heart size, ectasia & calcif thorAo; clear lungs, mild degen spondylosis, prev right shoulder surg... ~  PFTs 10/12 done for dyspnea:  FVC=4.26 (100%); FEV1=3.17 (114%); %1sec=74; mid-flows=99%pred; Lung vols & DLCO are both wnl as well...  Hx HBP w/ ACE cough>  On LOSARTAN 50mg /d & COREG 6.25mg  Bid...  BP= 120/68 & he denies CP or palpit, but as noted is c/o no energy/ feeling poorly/ "indigestion" etc. ~  Last CXR 5/10 showed tort thor Ao, clear lungs, post op changes right shoulder ~  2DEcho 6/12 showed norm wall thickness, reduced EF=45-50% w/ HK basal, inferior, lateral walls; Gr1DD, mild AI.Marland Kitchen. ~  5/15:  on [id number] & back on Bystolic5-1/2tab/d; Hx of ACE cough in past; BP= 116/66 & he denies CP, palpit, SOB, edema.Marland Kitchen  CAD/ NSTEMI 5/10 w/ PCI LAD & RCA>  Followed regularly by DrMcLean for Cards on ASA 81mg /d, PLAVIX 75mg /d, plus the Coreg, Losartan, Crestor, NTG prn. Ischemic Cardiomyopathy w/ diffuse HK on Echo 8/10 & EF= 45-50%, mild AI/ MR... ~  Hosp 5/10 by DrMcLean/ Cards> CAD & NSTEMI w/ Rotational atherectomy/PCI with Xience DES x 3 to RCA and rotational atherectomy/PCI with Xience DES to proximal LAD.  ~  EKG 6/12 showed SBrady, rate 58/min, otherw WNL... ~  1/14: Cards f/u by DrMcLean> hx  NSTEMI 5/10 w/ PCI to LAD & RCA; c/o exertional dyspnea> ETT-Myoview 6/12 was neg w/o ischemia or infarction; 2DEcho 6/12 w/ EF=45-50% (no ch from prev); repeat cath 8/12 showed nonobstructive dis w/ patent stents; Dyspnea ultimately believed due to BBlockers as he had difficulty w/ Coreg & Bystolic- better off these meds... EKG showed SBrady, rate56, wnl, NAD.Marland Kitchen. ~  5/15: on ASA81 & Plavix75; he had f/u DrMcLean 1/15> Hx NSTEMI w/ PCI to  LAD & RCA in 2010; Myoview 6/12 was neg for ischemia & 2DEcho showed EF=45-50% (stable, no change); repeat cath 8/12 showed patent stents and nonobstructive dis.  HYPERLIPIDEMIA>  On diet + CRESTOR 40mg /d and FISH OIL 1000mg  Bid... ~  FLP 10/11 on Cres40 showed TChol 112, TG 100, HDL 34, LDL 58 ~  FLP 7/12 on Cres40 showed TChol 123, TG 68, HDL 46, LDL 64 ~  FLP 8/13 on Cres40 showed TChol 108, TG 104, HDL 42, LDL 45  ~  FLP 1/15 on Cres40 showed TChol 136, TG 180, HDL 31, LDL 69   GERD/ Esophagitis>  On PEPCID which he takes just prn & not on PPI due to his Plavix rx... ~  Labs 6/12 showed Hg= 14.7, MCV= 93...  DIVERTICULOSIS OF COLON (ICD-562.10) - last colon 11/05 by DrSamLeB w/ divertics only... He saw DrJacobs 10/11 & they decided on f/u colon in 2015 despite family hx of colon cancer in mother since he has never had a polyp etc... Hx of IRRITABLE BOWEL SYNDROME (ICD-564.1) - he denies recent abd pain, n/v, change in bowels, etc... ~  1/15: he saw DrJacobs> divertics, neg colon 2005, on Plavix- they decided to forgo any further colon cancer screening...   Hx of ELEVATED PROSTATE SPECIFIC ANTIGEN (ICD-790.93) - followed by DrPeterson for Urology on UROXATRAL 10mg /d, & PROSCAR 5mg /d;  He tells me that DrPeterson follows his PSAs, we do not have any recent notes from Urology. Hx of RENAL CYST (ICD-593.2) ~  10/13: he had f/u DrEskridge> BPH w/ BOO, elev PSA, ED; stable on [place]; he tells me his PSA was "fine"...  OSTEOARTHRITIS  (ICD-715.90) LOW BACK PAIN, CHRONIC (ICD-724.2) - severe back discomfort w/ eval by ortho (DrOlin & Ramos) resulting in epid steroid shots, a round of prednisone, and percocet + advil for pain...   Hx of MIGRAINE HEADACHE (ICD-346.90) - he denies recent migraine problem...  INSOMNIA>  On BENEDRYL 25mg  prn use...  HEALTH MAINTENANCE: ~  GI:  Dr Ardis Hughs follows & f/u colon due 2015... ~  GU:  Followed by DrPeterson & he does his PSAs... ~  Immunizations:  He received Shingles vaccine in 2011   Past Medical History  Diagnosis Date  . CAD (coronary artery disease)     NSTEMI 5/10. Rotational atherectomy/PCI w Xience DES x 3 to RCA and rotation atherectomy /PCI w Xience DES to prox LAD  . Abdominal pain, unspecified site   . Allergic rhinitis, cause unspecified   . Diverticulosis of colon (without mention of hemorrhage)     last colon 11/05 by DrSamLeB w divertics only  . Irritable bowel syndrome   . Elevated prostate specific antigen (PSA)     followed by Dr Terance Hart for urology  . Acquired cyst of kidney   . Osteoarthrosis, unspecified whether generalized or localized, unspecified site   . Lumbago   . Migraine, unspecified, without mention of intractable migraine without mention of status migrainosus   . Hypertension     ACEI cough  . GERD (gastroesophageal reflux disease)     esophagitis  . Hyperlipidemia   . Ischemic cardiomyopathy     mild echo (8/10) w EF 45-50%, diffuse hypokinesis, mild AI and mild MR    Past Surgical History  Procedure Laterality Date  . Rotator cuff surgery      Dr. Shellia Carwin  . Bilat inguinal hernia repairs  7/06    Dr Hassell Done  . Angioplasty       Outpatient Encounter Prescriptions as  of 11/08/2013  Medication Sig  . alfuzosin (UROXATRAL) 10 MG 24 hr tablet Take 10 mg by mouth daily.    Marland Kitchen aspirin (ASPIR-LOW) 81 MG EC tablet 2 tablets (total 162mg ) daily  . cetirizine (ZYRTEC ALLERGY) 10 MG tablet Take 10 mg by mouth as needed.    . clopidogrel  (PLAVIX) 75 MG tablet take 1 tablet by mouth once daily  . diphenhydrAMINE (BENADRYL) 25 MG tablet Take 25 mg by mouth as needed. For itching    . famotidine (PEPCID) 20 MG tablet Take 20 mg by mouth 2 (two) times daily.   . finasteride (PROSCAR) 5 MG tablet Take 5 mg by mouth daily.    Marland Kitchen losartan (COZAAR) 25 MG tablet take 1 tablet by mouth once daily  . Multiple Vitamins-Minerals (MULTIVITAL) tablet Take 1 tablet by mouth daily.    . nebivolol (BYSTOLIC) 5 MG tablet Take 0.5 tablets (2.5 mg total) by mouth daily.  Marland Kitchen NITROSTAT 0.4 MG SL tablet DISSOLVE 1 TABLET UNDER THE TONGUE IF NEEDED AS DIRECTED.  . Omega-3 Fatty Acids (FISH OIL) 1000 MG CAPS Take by mouth daily.   . pramipexole (MIRAPEX) 0.25 MG tablet take 1 to 2 tablets by mouth at bedtime for RESTLESS LEG SYNDROME  . rosuvastatin (CRESTOR) 40 MG tablet take 1 tablet by mouth once daily  . sildenafil (VIAGRA) 100 MG tablet Take 1 tablet (100 mg total) by mouth daily as needed for erectile dysfunction.  . Timolol Maleate 0.5 % (DAILY) SOLN Apply to eye. Instill one droplet in OU   . travoprost, benzalkonium, (TRAVATAN) 0.004 % ophthalmic solution Place 1 drop into both eyes at bedtime.      Allergies  Allergen Reactions  . Amoxicillin-Pot Clavulanate     REACTION: pt developed HIVES on augmentin  . Codeine   . Morphine    ACE inhibitors:  Lisinopril w/ cough...     Current Medications, Allergies, Past Medical History, Past Surgical History, Family History, and Social History were reviewed in [employer] record.    Review of Systems       See HPI - all other systems neg except as noted...      The patient denies anorexia, fever, weight loss, weight gain, vision loss, decreased hearing, hoarseness, chest pain, syncope, peripheral edema, prolonged cough, hemoptysis, abdominal pain, melena, hematochezia, severe indigestion/heartburn, hematuria, incontinence, muscle weakness, suspicious skin lesions, transient  blindness, difficulty walking, depression, unusual weight change, abnormal bleeding, enlarged lymph nodes, and angioedema.     Objective:   Physical Exam    WD, WN, 80 y/o WM in NAD...  GENERAL:  Alert & oriented; pleasant & cooperative. HEENT:  [place]/AT, EOM-wnl, PERRLA, EACs-clear, TMs-wnl, NOSE-clear, THROAT-clear & wnl. NECK:  Supple w/ full ROM; no JVD; normal carotid impulses w/o bruits; no thyromegaly or nodules palpated; no lymphadenopathy. CHEST:  Clear to P & A; without wheezes/ rales/ or rhonchi. HEART:  Regular Rhythm; without murmurs/ rubs/ or gallops. ABDOMEN:  Soft & nontender; normal bowel sounds; no organomegaly or masses detected. EXT: without deformities, mild arthritic changes; no varicose veins/ venous insuffic/ or edema. NEURO:  CN's intact; motor testing normal; sensory testing normal; gait normal & balance OK. DERM:  No lesions noted; no rash etc...  RADIOLOGY DATA:  Reviewed in the EPIC EMR & discussed w/ the patient...  LABORATORY DATA:  Reviewed in the EPIC EMR & discussed w/ the patient...   Assessment & Plan:    RLS>  He wants Mirapex Rx & we filled Rx  for 0.50mg  1-2 Qhs ...  GI Symptoms/ GERD/ IBS/ Gas>  Followed by DrJacobs, on Pepcid Bid & add Mylicon, other Simethacone containing gas therapies...  CAD/ s/p NSTEMI 5/10 w/ PTCA & stents/ Ischemic Cardiomyopathy>  Followed by drMcLean 7 stable...  CHOL>  Stable on the Cres40...  GU>  Hx elev PSA>  All followed by DrEskridge...  LBP/ DJD>  Followed by DrOlin & Ramos...  Anxiety>  Add Xanax0.5mg  prn use...   Patient's Medications  New Prescriptions   PRAMIPEXOLE (MIRAPEX) 0.5 MG TABLET    Take 1-2 tablets by mouth at bedtime  Previous Medications   ALFUZOSIN (UROXATRAL) 10 MG 24 HR TABLET    Take 10 mg by mouth daily.     ASPIRIN (ASPIR-LOW) 81 MG EC TABLET    2 tablets (total 162mg ) daily   CETIRIZINE (ZYRTEC ALLERGY) 10 MG TABLET    Take 10 mg by mouth as needed.     CLOPIDOGREL (PLAVIX) 75 MG  TABLET    take 1 tablet by mouth once daily   DIPHENHYDRAMINE (BENADRYL) 25 MG TABLET    Take 25 mg by mouth as needed. For itching     FAMOTIDINE (PEPCID) 20 MG TABLET    Take 20 mg by mouth 2 (two) times daily.    FINASTERIDE (PROSCAR) 5 MG TABLET    Take 5 mg by mouth daily.     LOSARTAN (COZAAR) 25 MG TABLET    take 1 tablet by mouth once daily   MULTIPLE VITAMINS-MINERALS (MULTIVITAL) TABLET    Take 1 tablet by mouth daily.     NEBIVOLOL (BYSTOLIC) 5 MG TABLET    Take 0.5 tablets (2.5 mg total) by mouth daily.   NITROSTAT 0.4 MG SL TABLET    DISSOLVE 1 TABLET UNDER THE TONGUE IF NEEDED AS DIRECTED.   OMEGA-3 FATTY ACIDS (FISH OIL) 1000 MG CAPS    Take by mouth daily.    ROSUVASTATIN (CRESTOR) 40 MG TABLET    take 1 tablet by mouth once daily   SILDENAFIL (VIAGRA) 100 MG TABLET    Take 1 tablet (100 mg total) by mouth daily as needed for erectile dysfunction.   TIMOLOL MALEATE 0.5 % (DAILY) SOLN    Apply to eye. Instill one droplet in OU    TRAVOPROST, BENZALKONIUM, (TRAVATAN) 0.004 % OPHTHALMIC SOLUTION    Place 1 drop into both eyes at bedtime.    Modified Medications   Modified Medication Previous Medication   ALPRAZOLAM (XANAX) 0.5 MG TABLET ALPRAZolam (XANAX) 0.5 MG tablet      Take 1/2 to 1 tablet by mouth three times daily as needed for nerves    Take 1/2 to 1 tablet by mouth three times daily as needed for nerves  Discontinued Medications   PRAMIPEXOLE (MIRAPEX) 0.25 MG TABLET    take 1 to 2 tablets by mouth at bedtime for RESTLESS LEG SYNDROME

## 2014-01-16 ENCOUNTER — Other Ambulatory Visit: Payer: Self-pay | Admitting: Cardiology

## 2014-01-19 ENCOUNTER — Other Ambulatory Visit: Payer: Self-pay | Admitting: Cardiology

## 2014-02-19 ENCOUNTER — Other Ambulatory Visit: Payer: Self-pay | Admitting: Cardiology

## 2014-02-26 ENCOUNTER — Other Ambulatory Visit: Payer: Self-pay | Admitting: Cardiology

## 2014-06-01 ENCOUNTER — Other Ambulatory Visit: Payer: Self-pay | Admitting: Cardiology

## 2014-06-21 ENCOUNTER — Other Ambulatory Visit: Payer: Self-pay | Admitting: Pulmonary Disease

## 2014-07-04 ENCOUNTER — Ambulatory Visit (INDEPENDENT_AMBULATORY_CARE_PROVIDER_SITE_OTHER): Payer: Medicare Other | Admitting: Cardiology

## 2014-07-04 ENCOUNTER — Encounter: Payer: Self-pay | Admitting: Cardiology

## 2014-07-04 VITALS — BP 160/84 | HR 57 | Ht 70.0 in | Wt 182.0 lb

## 2014-07-04 DIAGNOSIS — E7800 Pure hypercholesterolemia, unspecified: Secondary | ICD-10-CM

## 2014-07-04 DIAGNOSIS — I2510 Atherosclerotic heart disease of native coronary artery without angina pectoris: Secondary | ICD-10-CM | POA: Diagnosis not present

## 2014-07-04 DIAGNOSIS — I10 Essential (primary) hypertension: Secondary | ICD-10-CM

## 2014-07-04 NOTE — Patient Instructions (Addendum)
Your physician has requested that you regularly monitor and record your blood pressure readings at home. Please use the same machine at the same time of day to check your readings and record them. I will call you in about 2 weeks to get the readings.   Your physician wants you to follow-up in: 1 year with Dr Aundra Dubin. (January 2017). You will receive a reminder letter in the mail two months in advance. If you don't receive a letter, please call our office to schedule the follow-up appointment.

## 2014-07-04 NOTE — Addendum Note (Signed)
Addended by: Katrine Coho on: 07/04/2014 01:46 PM   Modules accepted: Orders

## 2014-07-04 NOTE — Progress Notes (Signed)
Patient ID: Willie Lynch, male   DOB: 08-Apr-1934, 81 y.o.   MRN: [id number] PCP: Dr. Lenna Gilford  81 yo with NSTEMI in 5/10 s/p PCI to LAD and RCA.  In 2012, he had trouble with exertional dyspnea.  He did an ETT-myoview in 6/12 showing no ischemia or infarction.  Echo showed EF 45-50% (stable from prior).  Dyspnea improved for a while then worsened again. This time, he had a repeat cath (8/12).  This showed nonobstructive disease with patent stents.  BNP has not been elevated.  PFTs in 10/12 were normal.  I had him stop Coreg and start nebivolol instead with the thought that dyspnea could be related to beta-2 blockade and use of a more selective beta-1 blocker may help.  This seemed to help his dyspnea.   Since last appointment, Willie Lynch wife passed away.  Christmas was difficult but overall he seems to be doing ok.  He is still short of breath if he walks fast up steps but is able to exercise on the treadmill and elliptical for 30 minutes with no problems.  He does this 5-6 days/week. He plays golf without dyspnea.  No chest pain.  At last appointment, we cut back on his Bystolic due to fatigue.  BP is 160/84 today.  Weight is down 7 lbs since last appointment.   Labs (3/11): K 4.2, creatinine 0.7  Labs (4/11): LFTs nromal, LDL 43, HDL 35  Labs (10/11): HDL 34, LDL 58, LFTs normal  Labs (11/11): K 4.1, creatinine 0.7  Labs (6/12): K 4.1, creatinine 0.8, TSH normal, BNP 19 Labs (7/12): LDL 64, HDL 46 Labs (8/12): K 3.9, creatinine 0.8 Labs (2/13): LDL 41, HDL 23 Labs (8/13): LDL 45, HDL 42, K 3.9, creatinine 0.7 Labs (1/15): K 3.9, creatinine 1.0, LDL 69, HDL 31, HCT 40.7  Allergies:  1) ! Codeine  2) ! Morphine  3) ! Augmentin (Amoxicillin-Pot Clavulanate)   Past Medical History:  1. CAD: NSTEMI 5/10. Rotational atherectomy/PCI with Xience DES x 3 to RCA and rotational atherectomy/PCI with Xience DES to proximal LAD. ETT-myoview (6/12): 7:33, no ischemic ECG changes, EF 59%, no ischemia or  infarction on perfusion images.  Left heart cath (8/12): 60-70% distal CFX, 40% in-stent restenosis in proximal LAD.   2. Hyperlipidemia 3. ALLERGIC RHINITIS  4. DIVERTICULOSIS OF COLON - last colon 11/05 w/ divertics only.  5. IRRITABLE BOWEL SYNDROME 6. Hx of ELEVATED PROSTATE SPECIFIC ANTIGEN - followed by Dr Terance Hart for urology  7. Hx of RENAL CYST  8. OSTEOARTHRITIS   9. LOW BACK PAIN, CHRONIC   10. Hx of MIGRAINE HEADACHE  11. HTN: ACEI cough.  12. Esophagitis/GERD  13. Ischemic cardiomyopathy: Mild. Echo (8/10) with EF 45-50%, diffuse hypokinesis, mild AI and mild Willie.  Echo (6/12): EF 45-50%, basal to mid inferoposterior hypokinesis, mild aortic insufficiency, grade I diastolic dysfunction.  LV-gram 8/12 with EF 55%.  14. Dyspnea: PFTs normal.  Possible intolerance to Coreg.   Willie History:  mother died in her early 83s from metastatic cancer, Willie believes it may have been colon cancer that was diagnosed in her 11s  Father deceased age 69 - heart disease  One sibling alive age 58  One sibling deceased age 32 - unknown  One sibling deceased age 76 -unknown   Social History:  Quit smoking 1963  Alcohol - 4 drinks per week  Exercises  Caffeine - 2 cups per day  Widowed 2 children  Previously worked for [employer], now Optometrist.  Current Outpatient Prescriptions  Medication Sig Dispense Refill  . alfuzosin (UROXATRAL) 10 MG 24 hr tablet Take 10 mg by mouth daily.      Marland Kitchen ALPRAZolam (XANAX) 0.5 MG tablet Take 1/2 to 1 tablet by mouth three times daily as needed for nerves 90 tablet 5  . aspirin (ASPIR-LOW) 81 MG EC tablet Take 81 mg by mouth daily. 2 tablets (total 162mg ) daily    . BYSTOLIC 5 MG tablet take 1/2 tablet by mouth once daily 15 tablet 5  . cetirizine (ZYRTEC ALLERGY) 10 MG tablet Take 10 mg by mouth as needed.      . clopidogrel (PLAVIX) 75 MG tablet take 1 tablet by mouth once daily 30 tablet 11  . CRESTOR 40 MG tablet take 1 tablet by mouth once  daily 90 tablet 0  . diphenhydrAMINE (BENADRYL) 25 MG tablet Take 25 mg by mouth as needed. For itching      . famotidine (PEPCID) 20 MG tablet Take 20 mg by mouth 2 (two) times daily.     . finasteride (PROSCAR) 5 MG tablet Take 5 mg by mouth daily.      Marland Kitchen losartan (COZAAR) 25 MG tablet take 1 tablet by mouth once daily 30 tablet 5  . Multiple Vitamins-Minerals (MULTIVITAL) tablet Take 1 tablet by mouth daily.      Marland Kitchen NITROSTAT 0.4 MG SL tablet DISSOLVE 1 TABLET UNDER THE TONGUE IF NEEDED AS DIRECTED. 25 tablet 3  . Omega-3 Fatty Acids (FISH OIL) 1000 MG CAPS Take by mouth daily.     . pramipexole (MIRAPEX) 0.5 MG tablet take 1 to 2 tablets by mouth at bedtime 60 tablet 1  . Timolol Maleate 0.5 % (DAILY) SOLN Apply to eye daily. Instill one droplet in OU    . travoprost, benzalkonium, (TRAVATAN) 0.004 % ophthalmic solution Place 1 drop into both eyes at bedtime.      Marland Kitchen VIAGRA 100 MG tablet take 1 tablet by mouth once daily if needed for ERECTILE DYSFUNCTION 10 tablet 0   No current facility-administered medications for this visit.    BP 160/84 mmHg  Pulse 57  Ht 5\' 10"  (1.778 m)  Wt 182 lb (82.555 kg)  BMI 26.11 kg/m2 General: NAD Neck: No JVD, no thyromegaly or thyroid nodule.  Lungs: Clear to auscultation bilaterally with normal respiratory effort. CV: Nondisplaced PMI.  Heart regular S1/S2, no S3/S4, no murmur.  No peripheral edema.  No carotid bruit.  Normal pedal pulses.  Abdomen: Soft, nontender, no hepatosplenomegaly, no distention.  Neurologic: Alert and oriented x 3.  Psych: Normal affect. Extremities: No clubbing or cyanosis.   Assessment/Plan  CORONARY ATHEROSCLEROSIS NATIVE CORONARY ARTERY  No obstructive coronary disease on cath 8/12. Continue ASA 81, beta blocker, Crestor, losartan.  Given multiple stents (DES) will continue Plavix.  He has had no bleeding problems.  Dyspnea  This has mostly resolved.  HYPERCHOLESTEROLEMIA Needs repeat lipid check. HTN BP high in  the office today.  I will have him check BP daily at home, we will call him in 2 wks to see what numbers are running.  May need to increase losartan.   Loralie Champagne 07/04/2014 1:37 PM

## 2014-07-04 NOTE — Addendum Note (Signed)
Addended by: Katrine Coho on: 07/04/2014 01:48 PM   Modules accepted: Orders

## 2014-07-05 ENCOUNTER — Other Ambulatory Visit: Payer: Medicare Other

## 2014-07-05 ENCOUNTER — Other Ambulatory Visit (INDEPENDENT_AMBULATORY_CARE_PROVIDER_SITE_OTHER): Payer: Medicare Other

## 2014-07-05 DIAGNOSIS — E7800 Pure hypercholesterolemia, unspecified: Secondary | ICD-10-CM

## 2014-07-05 DIAGNOSIS — I2510 Atherosclerotic heart disease of native coronary artery without angina pectoris: Secondary | ICD-10-CM | POA: Diagnosis not present

## 2014-07-05 DIAGNOSIS — I10 Essential (primary) hypertension: Secondary | ICD-10-CM

## 2014-07-05 LAB — BASIC METABOLIC PANEL
BUN: 19 mg/dL (ref 6–23)
CO2: 24 meq/L (ref 19–32)
CREATININE: 0.84 mg/dL (ref 0.40–1.50)
Calcium: 9.1 mg/dL (ref 8.4–10.5)
Chloride: 108 mEq/L (ref 96–112)
GFR: 93.69 mL/min (ref >60.00)
GLUCOSE: 94 mg/dL (ref 70–99)
Potassium: 4.2 mEq/L (ref 3.5–5.1)
Sodium: 142 mEq/L (ref 135–145)

## 2014-07-05 LAB — CBC WITH DIFFERENTIAL/PLATELET
Basophils Absolute: 0 10*3/uL (ref 0.0–0.1)
Basophils Relative: 0.5 % (ref 0.0–3.0)
EOS ABS: 0.1 10*3/uL (ref 0.0–0.7)
EOS PCT: 2.4 % (ref 0.0–5.0)
HCT: 45 % (ref 39.0–52.0)
HEMOGLOBIN: 15 g/dL (ref 13.0–17.0)
LYMPHS PCT: 26.5 % (ref 12.0–46.0)
Lymphs Abs: 1.6 10*3/uL (ref 0.7–4.0)
MCHC: 33.4 g/dL (ref 30.0–36.0)
MCV: 93 fl (ref 78.0–100.0)
MONO ABS: 0.6 10*3/uL (ref 0.1–1.0)
Monocytes Relative: 9.4 % (ref 3.0–12.0)
Neutro Abs: 3.7 10*3/uL (ref 1.4–7.7)
Neutrophils Relative %: 61.2 % (ref 43.0–77.0)
PLATELETS: 107 10*3/uL — AB (ref 150.0–400.0)
RBC: 4.84 Mil/uL (ref 4.22–5.81)
RDW: 13.7 % (ref 11.5–15.5)
WBC: 6 10*3/uL (ref 4.0–10.5)

## 2014-07-05 LAB — LIPID PANEL
CHOL/HDL RATIO: 3
Cholesterol: 125 mg/dL (ref 0–200)
HDL: 48.9 mg/dL (ref >39.00)
LDL Cholesterol: 60 mg/dL (ref 0–99)
NonHDL: 76.1
Triglycerides: 83 mg/dL (ref 0.0–149.0)
VLDL: 16.6 mg/dL (ref 0.0–40.0)

## 2014-07-10 ENCOUNTER — Telehealth: Payer: Self-pay | Admitting: Cardiology

## 2014-07-10 NOTE — Telephone Encounter (Signed)
New Message  Pt requested to speak w/ Rn. Also stated that a VM would be perfectly fine; please call back and discuss.

## 2014-07-10 NOTE — Telephone Encounter (Signed)
Spoke with patient about recent lab results 

## 2014-07-14 ENCOUNTER — Telehealth: Payer: Self-pay | Admitting: Physician Assistant

## 2014-07-14 NOTE — Telephone Encounter (Signed)
     Seen by Dr. Aundra Dubin 07/04/14. At a previous appointment, they cut back on his Bystolic due to fatigue. BP was 160/84 on his 07/04/14 visit. In Dr Claris Gladden plan he was going to have him check his BPs daily at home and increase losartan if they remained elevated. His BP was pretty well controlled for most of the week but over the past couple days he has consistently been SBP >180. We will increase his losartan from 25mg  to 50mg  and i will have him call the office Monday to make a follow up appointment with Dr. Aundra Dubin.  Angelena Form PA-C  [facility]

## 2014-07-15 ENCOUNTER — Telehealth: Payer: Self-pay | Admitting: Physician Assistant

## 2014-07-15 NOTE — Telephone Encounter (Signed)
     I spoke with Willie Lynch yesterday> note copied below .      Expand All Collapse All       Seen by Dr. Aundra Dubin 07/04/14. At a previous appointment, they cut back on his Bystolic due to fatigue. BP was 160/84 on his 07/04/14 visit. In Dr Claris Gladden plan he was going to have him check his BPs daily at home and increase losartan if they remained elevated. His BP was pretty well controlled for most of the week but over the past couple days he has consistently been SBP >180. We will increase his losartan from 25mg  to 50mg  and i will have him call the office Monday to make a follow up appointment with Dr. Aundra Dubin.      He called again today and BP still running high on increased losartan. No CP, SOB, dizziness. Feels fine. I advised him to continue on this increased dose and call the office tomorrow.    Angelena Form PA-C  [facility]

## 2014-07-16 ENCOUNTER — Ambulatory Visit (INDEPENDENT_AMBULATORY_CARE_PROVIDER_SITE_OTHER): Payer: Medicare Other | Admitting: Cardiology

## 2014-07-16 ENCOUNTER — Encounter: Payer: Self-pay | Admitting: Cardiology

## 2014-07-16 VITALS — BP 130/70 | HR 60 | Ht 70.0 in | Wt 180.0 lb

## 2014-07-16 DIAGNOSIS — E7800 Pure hypercholesterolemia, unspecified: Secondary | ICD-10-CM

## 2014-07-16 DIAGNOSIS — I10 Essential (primary) hypertension: Secondary | ICD-10-CM

## 2014-07-16 DIAGNOSIS — I2510 Atherosclerotic heart disease of native coronary artery without angina pectoris: Secondary | ICD-10-CM

## 2014-07-16 MED ORDER — LOSARTAN POTASSIUM 50 MG PO TABS: 50.0000 mg | ORAL_TABLET | Freq: Two times a day (BID) | ORAL | Status: DC

## 2014-07-16 NOTE — Patient Instructions (Signed)
Increase losartan to 50mg  two times a day.   Your physician recommends that you return for lab work today--BMET.  Your physician recommends that you return for lab work in: 2 weeks--BMET.  Your physician has requested that you regularly monitor and record your blood pressure readings at home. Please use the same machine at the same time of day to check your readings and record them. I will call you in about 2 weeks to get the readings.   Your physician recommends that you schedule a follow-up appointment in: 3 months with Dr Aundra Dubin.

## 2014-07-16 NOTE — Progress Notes (Signed)
Patient ID: Willie Lynch, male   DOB: Aug 18, 1933, 81 y.o.   MRN: [id number] PCP: Dr. Lenna Gilford  81 yo with NSTEMI in 5/10 s/p PCI to LAD and RCA.  In 2012, he had trouble with exertional dyspnea.  He did an ETT-myoview in 6/12 showing no ischemia or infarction.  Echo showed EF 45-50% (stable from prior).  Dyspnea improved for a while then worsened again. This time, he had a repeat cath (8/12).  This showed nonobstructive disease with patent stents.  BNP has not been elevated.  PFTs in 10/12 were normal.  I had him stop Coreg and start nebivolol instead with the thought that dyspnea could be related to beta-2 blockade and use of a more selective beta-1 blocker may help.  This seemed to help his dyspnea.   I saw him a couple of weeks ago and he was doing well.  However, his BP seems to have been steadily rising.  BP this weekend was running in the 180s-190s/90s-100s range.  He called in and was told to increase his losartan to 50 mg daily. He has been doing this and SBP seems to be running in the 160s for the most part now at home.  It is actually 130/70 in the office today.    He is still short of breath if he walks fast up steps but is able to exercise on the treadmill and elliptical for 30 minutes with no problems.  He does this 5-6 days/week. He plays golf without dyspnea.  No chest pain.    ECG: NSR, normal  Labs (3/11): K 4.2, creatinine 0.7  Labs (4/11): LFTs nromal, LDL 43, HDL 35  Labs (10/11): HDL 34, LDL 58, LFTs normal  Labs (11/11): K 4.1, creatinine 0.7  Labs (6/12): K 4.1, creatinine 0.8, TSH normal, BNP 19 Labs (7/12): LDL 64, HDL 46 Labs (8/12): K 3.9, creatinine 0.8 Labs (2/13): LDL 41, HDL 23 Labs (8/13): LDL 45, HDL 42, K 3.9, creatinine 0.7 Labs (1/15): K 3.9, creatinine 1.0, LDL 69, HDL 31, HCT 40.7 Labs (1/16): K 4.2, creatinine 0.84, HCT 45, LDL 60, HDL 49  Allergies:  1) ! Codeine  2) ! Morphine  3) ! Augmentin (Amoxicillin-Pot Clavulanate)   Past Medical History:  1.  CAD: NSTEMI 5/10. Rotational atherectomy/PCI with Xience DES x 3 to RCA and rotational atherectomy/PCI with Xience DES to proximal LAD. ETT-myoview (6/12): 7:33, no ischemic ECG changes, EF 59%, no ischemia or infarction on perfusion images.  Left heart cath (8/12): 60-70% distal CFX, 40% in-stent restenosis in proximal LAD.   2. Hyperlipidemia 3. ALLERGIC RHINITIS  4. DIVERTICULOSIS OF COLON - last colon 11/05 w/ divertics only.  5. IRRITABLE BOWEL SYNDROME 6. Hx of ELEVATED PROSTATE SPECIFIC ANTIGEN - followed by Dr Terance Hart for urology  7. Hx of RENAL CYST  8. OSTEOARTHRITIS   9. LOW BACK PAIN, CHRONIC   10. Hx of MIGRAINE HEADACHE  11. HTN: ACEI cough.  12. Esophagitis/GERD  13. Ischemic cardiomyopathy: Mild. Echo (8/10) with EF 45-50%, diffuse hypokinesis, mild AI and mild MR.  Echo (6/12): EF 45-50%, basal to mid inferoposterior hypokinesis, mild aortic insufficiency, grade I diastolic dysfunction.  LV-gram 8/12 with EF 55%.  14. Dyspnea: PFTs normal.  Possible intolerance to Coreg.   Family History:  mother died in her early 88s from metastatic cancer, family believes it may have been colon cancer that was diagnosed in her 54s  Father deceased age 16 - heart disease  One sibling alive age 24  One sibling deceased age 46 - unknown  One sibling deceased age 29 -unknown   Social History:  Quit smoking 1963  Alcohol - 4 drinks per week  Exercises  Caffeine - 2 cups per day  Widowed 2 children  Previously worked for [employer], now Optometrist.   ROS: All systems reviewed and negative except as per HPI.    Current Outpatient Prescriptions  Medication Sig Dispense Refill  . alfuzosin (UROXATRAL) 10 MG 24 hr tablet Take 10 mg by mouth daily.      Marland Kitchen ALPRAZolam (XANAX) 0.5 MG tablet Take 1/2 to 1 tablet by mouth three times daily as needed for nerves 90 tablet 5  . aspirin (ASPIR-LOW) 81 MG EC tablet Take 81 mg by mouth daily. 2 tablets (total 162mg ) daily    . BYSTOLIC 5 MG  tablet take 1/2 tablet by mouth once daily 15 tablet 5  . cetirizine (ZYRTEC ALLERGY) 10 MG tablet Take 10 mg by mouth as needed.      . clopidogrel (PLAVIX) 75 MG tablet take 1 tablet by mouth once daily 30 tablet 11  . CRESTOR 40 MG tablet take 1 tablet by mouth once daily 90 tablet 0  . diphenhydrAMINE (BENADRYL) 25 MG tablet Take 25 mg by mouth as needed. For itching      . famotidine (PEPCID) 20 MG tablet Take 20 mg by mouth 2 (two) times daily.     . finasteride (PROSCAR) 5 MG tablet Take 5 mg by mouth daily.      . Multiple Vitamins-Minerals (MULTIVITAL) tablet Take 1 tablet by mouth daily.      Marland Kitchen NITROSTAT 0.4 MG SL tablet DISSOLVE 1 TABLET UNDER THE TONGUE IF NEEDED AS DIRECTED. 25 tablet 3  . Omega-3 Fatty Acids (FISH OIL) 1000 MG CAPS Take by mouth daily.     . pramipexole (MIRAPEX) 0.5 MG tablet take 1 to 2 tablets by mouth at bedtime 60 tablet 1  . Timolol Maleate 0.5 % (DAILY) SOLN Apply to eye daily. Instill one droplet in OU    . travoprost, benzalkonium, (TRAVATAN) 0.004 % ophthalmic solution Place 1 drop into both eyes at bedtime.      Marland Kitchen VIAGRA 100 MG tablet take 1 tablet by mouth once daily if needed for ERECTILE DYSFUNCTION 10 tablet 0  . losartan (COZAAR) 50 MG tablet Take 1 tablet (50 mg total) by mouth 2 (two) times daily. 180 tablet 1   No current facility-administered medications for this visit.    BP 130/70 mmHg  Pulse 60  Ht 5\' 10"  (1.778 m)  Wt 180 lb (81.647 kg)  BMI 25.83 kg/m2 General: NAD Neck: No JVD, no thyromegaly or thyroid nodule.  Lungs: Clear to auscultation bilaterally with normal respiratory effort. CV: Nondisplaced PMI.  Heart regular S1/S2, no S3/S4, no murmur.  No peripheral edema.  No carotid bruit.  Normal pedal pulses.  Abdomen: Soft, nontender, no hepatosplenomegaly, no distention.  Neurologic: Alert and oriented x 3.  Psych: Normal affect. Extremities: No clubbing or cyanosis.   Assessment/Plan  CORONARY ATHEROSCLEROSIS  No  obstructive coronary disease on cath 8/12. Continue ASA 81, beta blocker, Crestor, losartan.  Given multiple stents (DES) will continue Plavix.  He has had no bleeding problems.  Dyspnea  This has mostly resolved.  HYPERCHOLESTEROLEMIA Good lipids when recently checked. HTN BP has been running high.  I will increase losartan again to 50 mg bid.  He will have BMET done today and again in 2 wks.  We will  call him in 2 wks to make sure BP is heading down.  Next step likely would be addition of amlodipine.    Loralie Champagne 07/16/2014

## 2014-07-17 LAB — BASIC METABOLIC PANEL
BUN: 20 mg/dL (ref 6–23)
CALCIUM: 9.6 mg/dL (ref 8.4–10.5)
CHLORIDE: 104 meq/L (ref 96–112)
CO2: 23 meq/L (ref 19–32)
Creatinine, Ser: 0.92 mg/dL (ref 0.40–1.50)
GFR: 84.35 mL/min (ref >60.00)
Glucose, Bld: 108 mg/dL — ABNORMAL HIGH (ref 70–99)
POTASSIUM: 4.1 meq/L (ref 3.5–5.1)
SODIUM: 136 meq/L (ref 135–145)

## 2014-07-26 DIAGNOSIS — L820 Inflamed seborrheic keratosis: Secondary | ICD-10-CM | POA: Diagnosis not present

## 2014-07-26 DIAGNOSIS — L570 Actinic keratosis: Secondary | ICD-10-CM | POA: Diagnosis not present

## 2014-07-26 DIAGNOSIS — L821 Other seborrheic keratosis: Secondary | ICD-10-CM | POA: Diagnosis not present

## 2014-07-26 DIAGNOSIS — L814 Other melanin hyperpigmentation: Secondary | ICD-10-CM | POA: Diagnosis not present

## 2014-07-31 ENCOUNTER — Other Ambulatory Visit (INDEPENDENT_AMBULATORY_CARE_PROVIDER_SITE_OTHER): Payer: Medicare Other

## 2014-07-31 ENCOUNTER — Telehealth: Payer: Self-pay | Admitting: *Deleted

## 2014-07-31 DIAGNOSIS — I10 Essential (primary) hypertension: Secondary | ICD-10-CM

## 2014-07-31 LAB — BASIC METABOLIC PANEL
BUN: 24 mg/dL — AB (ref 6–23)
CHLORIDE: 105 meq/L (ref 96–112)
CO2: 25 mEq/L (ref 19–32)
Calcium: 9 mg/dL (ref 8.4–10.5)
Creatinine, Ser: 0.82 mg/dL (ref 0.40–1.50)
GFR: 96.32 mL/min (ref >60.00)
GLUCOSE: 95 mg/dL (ref 70–99)
POTASSIUM: 4 meq/L (ref 3.5–5.1)
Sodium: 136 mEq/L (ref 135–145)

## 2014-07-31 NOTE — Telephone Encounter (Signed)
LMTCB

## 2014-07-31 NOTE — Telephone Encounter (Signed)
Needs another agent. Start amlodipine 5 mg daily, call for BP readings in 2 wks.

## 2014-07-31 NOTE — Telephone Encounter (Signed)
HTN BP has been running high. I will increase losartan again to 50 mg bid. He will have BMET done today and again in 2 wks. We will call him in 2 wks to make sure BP is heading down. Next step likely would be addition of amlodipine.   BP readings last 2 weeks since 07/17/14-today:   134/69 74 174/88 71 166/78 65 171/72 71 163/78 64 150/72 68 157/78 68 157/76 67 123/57 74 130/66 70 126/60 73 167/75 80 163/74 66 145/73 79 166/84  70   Pt had BMET 07/31/14.  Pt advised I will forward to Dr Aundra Dubin for review.

## 2014-08-02 MED ORDER — AMLODIPINE BESYLATE 5 MG PO TABS: 5.0000 mg | ORAL_TABLET | Freq: Every day | ORAL | Status: DC

## 2014-08-02 NOTE — Telephone Encounter (Signed)
Pt advised, verbalized understanding, pt will call in 2-3 weeks with BP readings.

## 2014-08-03 ENCOUNTER — Telehealth: Payer: Self-pay | Admitting: Cardiology

## 2014-08-03 NOTE — Telephone Encounter (Addendum)
New message      Pt c/o medication issue:  1. Name of Medication: losartan  2. How are you currently taking this medication (dosage and times per day)?  3. Are you having a reaction (difficulty breathing--STAT)?  4. What is your medication issue? Pt started on a new bp medication.  Is he to continue taking losartan and the new medication?  No one told him what to do with the old medication

## 2014-08-03 NOTE — Telephone Encounter (Signed)
Left message for patient to call back to office and ask for [place], [place]

## 2014-08-04 ENCOUNTER — Other Ambulatory Visit: Payer: Self-pay | Admitting: Cardiology

## 2014-08-06 NOTE — Telephone Encounter (Signed)
Follow up     Pt got a new presc but should he continue taking the losartan?

## 2014-08-06 NOTE — Telephone Encounter (Signed)
Pt advised to continue losartan at current dose, amlodipine will be in addition to losartan.

## 2014-08-25 ENCOUNTER — Other Ambulatory Visit: Payer: Self-pay | Admitting: Cardiology

## 2014-09-04 ENCOUNTER — Other Ambulatory Visit: Payer: Self-pay | Admitting: Cardiology

## 2014-09-04 DIAGNOSIS — H2512 Age-related nuclear cataract, left eye: Secondary | ICD-10-CM | POA: Diagnosis not present

## 2014-09-04 DIAGNOSIS — H4011X2 Primary open-angle glaucoma, moderate stage: Secondary | ICD-10-CM | POA: Diagnosis not present

## 2014-09-10 ENCOUNTER — Other Ambulatory Visit: Payer: Self-pay | Admitting: Pulmonary Disease

## 2014-10-01 ENCOUNTER — Other Ambulatory Visit: Payer: Self-pay | Admitting: Cardiology

## 2014-10-16 ENCOUNTER — Ambulatory Visit: Payer: Medicare Other | Admitting: Cardiology

## 2014-12-04 ENCOUNTER — Other Ambulatory Visit: Payer: Self-pay | Admitting: Cardiology

## 2014-12-31 ENCOUNTER — Encounter: Payer: Self-pay | Admitting: Nurse Practitioner

## 2014-12-31 ENCOUNTER — Telehealth: Payer: Self-pay | Admitting: Cardiology

## 2014-12-31 ENCOUNTER — Ambulatory Visit
Admission: RE | Admit: 2014-12-31 | Discharge: 2014-12-31 | Disposition: A | Discharge status: Home / Self Care | Payer: Medicare Other | Source: Ambulatory Visit | Attending: Nurse Practitioner | Admitting: Nurse Practitioner

## 2014-12-31 ENCOUNTER — Ambulatory Visit (INDEPENDENT_AMBULATORY_CARE_PROVIDER_SITE_OTHER): Payer: Medicare Other | Admitting: Nurse Practitioner

## 2014-12-31 VITALS — BP 130/72 | HR 63 | Ht 70.0 in | Wt 186.0 lb

## 2014-12-31 DIAGNOSIS — I10 Essential (primary) hypertension: Secondary | ICD-10-CM

## 2014-12-31 DIAGNOSIS — I2510 Atherosclerotic heart disease of native coronary artery without angina pectoris: Secondary | ICD-10-CM | POA: Diagnosis not present

## 2014-12-31 DIAGNOSIS — R079 Chest pain, unspecified: Secondary | ICD-10-CM

## 2014-12-31 DIAGNOSIS — R0600 Dyspnea, unspecified: Secondary | ICD-10-CM | POA: Diagnosis not present

## 2014-12-31 DIAGNOSIS — R0602 Shortness of breath: Secondary | ICD-10-CM | POA: Diagnosis not present

## 2014-12-31 LAB — BASIC METABOLIC PANEL
BUN: 16 mg/dL (ref 6–23)
CO2: 24 mEq/L (ref 19–32)
Calcium: 9.2 mg/dL (ref 8.4–10.5)
Chloride: 104 mEq/L (ref 96–112)
Creatinine, Ser: 0.79 mg/dL (ref 0.40–1.50)
GFR: 100.44 mL/min (ref >60.00)
Glucose, Bld: 114 mg/dL — ABNORMAL HIGH (ref 70–99)
Potassium: 3.8 mEq/L (ref 3.5–5.1)
Sodium: 137 mEq/L (ref 135–145)

## 2014-12-31 LAB — CBC
HCT: 42.6 % (ref 39.0–52.0)
Hemoglobin: 14.5 g/dL (ref 13.0–17.0)
MCHC: 34 g/dL (ref 30.0–36.0)
MCV: 92.3 fl (ref 78.0–100.0)
Platelets: 108 10*3/uL — ABNORMAL LOW (ref 150.0–400.0)
RBC: 4.62 Mil/uL (ref 4.22–5.81)
RDW: 13.8 % (ref 11.5–15.5)
WBC: 4.8 10*3/uL (ref 4.0–10.5)

## 2014-12-31 LAB — BRAIN NATRIURETIC PEPTIDE: Pro B Natriuretic peptide (BNP): 63 pg/mL (ref 0.0–100.0)

## 2014-12-31 LAB — D-DIMER, QUANTITATIVE: D-Dimer, Quant: 0.41 ug/mL-FEU (ref 0.00–0.48)

## 2014-12-31 LAB — TROPONIN I: TNIDX: 0.01 ug/l (ref 0.00–0.06)

## 2014-12-31 MED ORDER — NITROGLYCERIN 0.4 MG SL SUBL: SUBLINGUAL_TABLET | SUBLINGUAL | Status: DC

## 2014-12-31 NOTE — Telephone Encounter (Signed)
Received call directly from triage; c/o SOB since Saturday, worse yesterday.  He complains of unable to breath deeply, states no noticeable chest pain but feels like he cannot take a deep breath so maybe "some chest tightness" Denies arm, back, shoulder or neck pain; denies n/v; diaphoresis.  States he did not exert himself yesterday so uncertain as to whether symptoms worsen with exertion.  Patient states symptoms similar to when he had previous MI 2010 and he wants Dr. Aundra Dubin to see him.  I advised that Dr. Aundra Dubin is not at the [facility] today; he is scheduled for CHF clinic.  I advised him that I will try to get in touch with him for advice and call him back.  Patient verbalized understanding and agreement.

## 2014-12-31 NOTE — Telephone Encounter (Signed)
Pt c/o Shortness Of Breath: STAT if SOB developed within the last 24 hours or pt is noticeably SOB on the phone  1. Are you currently SOB (can you hear that pt is SOB on the phone)? Yes  2. How long have you been experiencing SOB? Since Saturday  3. Are you SOB when sitting or when up moving around? Both  4. Are you currently experiencing any other symptoms? No

## 2014-12-31 NOTE — Progress Notes (Signed)
Troponin and BNP normal, labs ok.  Agree with Cardiolite and echo.  I can see him back once his cardiolite and echo are done time frame to depend on what they look like.

## 2014-12-31 NOTE — Progress Notes (Signed)
CARDIOLOGY OFFICE NOTE  Date:  12/31/2014    Burr Medico Date of Birth: 11/19/1933 Medical Record #[id number]  PCP:  Noralee Space, MD  Cardiologist:  Aundra Dubin    Chief Complaint  Patient presents with  . Shortness of Breath    Work in visit - seen for Dr. Aundra Dubin    History of Present Illness: Willie Lynch is a 81 y.o. male who presents today for a work in visit. Seen for Dr. Aundra Dubin. He has known CAD with prior NSTEMI in 5/10 s/p PCI to LAD and RCA. In 2012, he had trouble with exertional dyspnea. He did an ETT-myoview in 6/12 showing no ischemia or infarction. Echo showed EF 45-50% (stable from prior). Dyspnea improved for a while then worsened again. This time, he had a repeat cath (8/12). This showed nonobstructive disease with patent stents. BNP has not been elevated. PFTs in 10/12 were normal.Coreg was stopped and he was started on  nebivolol instead with the thought that dyspnea could be related to beta-2 blockade and use of a more selective beta-1 blocker may help. This seemed to help his dyspnea.   Seen in January. BP was running in the 180s-190s/90s-100s range. ARB was increased.   Last seen in January of 2016 - remained short of breath with walking fast up steps but otherwise his exercise tolerance was stable.   Phone call today -   "I called Heart Failure clinic and there are no available appointments with Dr. Aundra Dubin per Nira Conn, [place]. I called and advised patient that given his symptoms and no available appointments with Dr. Aundra Dubin or our FLEX APP, that I advise patient should go to the ED. The patient became argumentative and states he does not want to spend the day in the hospital and that he doesn't understand why he has been rescheduled 3 times and can't see his own doctor. I advised that I will double check the schedule for cancellations this morning. After talking with our FLEX APP, Truitt Merle, NP I was able to schedule the patient at 9:00 with  her today due to a cancellation. The patient verbalized understanding and agreement with plan of care."  Comes in today. Here alone. He is not happy - as noted in the phone note. He says his shortness of breath is now different - feels like it did with his MI. Can't get a "deep breath". No actual pain. Some tightness in the lower sternal area/upper abdomen.  Exertion does not really make a difference. Worse yesterday. Still exercising - 30 minutes on the elliptical 5 days a week and has done fine. This shortness of breath started on Saturday - has persisted.  Little cough. No fever or chills. His weight is up but no swelling. He does eat out most meals.   Past Medical History  Diagnosis Date  . CAD (coronary artery disease)     NSTEMI 5/10. Rotational atherectomy/PCI w Xience DES x 3 to RCA and rotation atherectomy /PCI w Xience DES to prox LAD  . Abdominal pain, unspecified site   . Allergic rhinitis, cause unspecified   . Diverticulosis of colon (without mention of hemorrhage)     last colon 11/05 by DrSamLeB w divertics only  . Irritable bowel syndrome   . Elevated prostate specific antigen (PSA)     followed by Dr Terance Hart for urology  . Acquired cyst of kidney   . Osteoarthrosis, unspecified whether generalized or localized, unspecified site   . Lumbago   .  Migraine, unspecified, without mention of intractable migraine without mention of status migrainosus   . Hypertension     ACEI cough  . GERD (gastroesophageal reflux disease)     esophagitis  . Hyperlipidemia   . Ischemic cardiomyopathy     mild echo (8/10) w EF 45-50%, diffuse hypokinesis, mild AI and mild MR    Past Surgical History  Procedure Laterality Date  . Rotator cuff surgery      Dr. Shellia Carwin  . Bilat inguinal hernia repairs  7/06    Dr Hassell Done  . Angioplasty       Medications: Current Outpatient Prescriptions  Medication Sig Dispense Refill  . alfuzosin (UROXATRAL) 10 MG 24 hr tablet Take 10 mg by mouth  daily.      Marland Kitchen ALPRAZolam (XANAX) 0.5 MG tablet Take 1/2 to 1 tablet by mouth three times daily as needed for nerves 90 tablet 5  . amLODipine (NORVASC) 5 MG tablet Take 1 tablet (5 mg total) by mouth daily. 30 tablet 6  . aspirin (ASPIR-LOW) 81 MG EC tablet Take 81 mg by mouth daily. 2 tablets (total 162mg ) daily    . BYSTOLIC 5 MG tablet take 1/2 tablet by mouth once daily 15 tablet 5  . cetirizine (ZYRTEC ALLERGY) 10 MG tablet Take 10 mg by mouth as needed.      . clopidogrel (PLAVIX) 75 MG tablet take 1 tablet by mouth once daily 30 tablet 5  . CRESTOR 40 MG tablet take 1 tablet by mouth once daily 90 tablet 1  . diphenhydrAMINE (BENADRYL) 25 MG tablet Take 25 mg by mouth as needed. For itching      . famotidine (PEPCID) 20 MG tablet Take 20 mg by mouth 2 (two) times daily.     . finasteride (PROSCAR) 5 MG tablet Take 5 mg by mouth daily.      Marland Kitchen losartan (COZAAR) 50 MG tablet Take 1 tablet (50 mg total) by mouth 2 (two) times daily. 180 tablet 1  . Multiple Vitamins-Minerals (MULTIVITAL) tablet Take 1 tablet by mouth daily.      . nitroGLYCERIN (NITROSTAT) 0.4 MG SL tablet DISSOLVE 1 TABLET UNDER THE TONGUE IF NEEDED AS DIRECTED. 25 tablet 3  . Omega-3 Fatty Acids (FISH OIL) 1000 MG CAPS Take by mouth daily.     . pramipexole (MIRAPEX) 0.5 MG tablet take 1 to 2 tablets by mouth once daily at bedtime 60 tablet 2  . Timolol Maleate 0.5 % (DAILY) SOLN Apply to eye daily. Instill one droplet in OU    . travoprost, benzalkonium, (TRAVATAN) 0.004 % ophthalmic solution Place 1 drop into both eyes at bedtime.      Marland Kitchen VIAGRA 100 MG tablet take 1 tablet by mouth once daily if needed for ERECTILE DYSFUNCTION 10 tablet 0   No current facility-administered medications for this visit.    Allergies: Allergies  Allergen Reactions  . Amoxicillin-Pot Clavulanate     REACTION: pt developed HIVES on augmentin  . Codeine   . Morphine     Social History: The patient  reports that he quit smoking about  53 years ago. He has never used smokeless tobacco. He reports that he drinks alcohol. He reports that he does not use illicit drugs.   Family History: The patient's family history includes Cancer in an other family member; Heart disease in his father.   Review of Systems: Please see the history of present illness.   Otherwise, the review of systems is positive for none.  All other systems are reviewed and negative.   Physical Exam: VS:  BP 130/72 mmHg  Pulse 63  Ht 5\' 10"  (1.778 m)  Wt 186 lb (84.369 kg)  BMI 26.69 kg/m2  SpO2 97% .  BMI Body mass index is 26.69 kg/(m^2).  Wt Readings from Last 3 Encounters:  12/31/14 186 lb (84.369 kg)  07/16/14 180 lb (81.647 kg)  07/04/14 182 lb (82.555 kg)    General: Pleasant. Well developed, well nourished and in no acute distress. His weight is up 6 pounds.  HEENT: Normal. Neck: Supple, no JVD, carotid bruits, or masses noted.  Cardiac: Regular rate and rhythm. No murmurs, rubs, or gallops. No edema.  Respiratory:  Lungs are clear to auscultation bilaterally with normal work of breathing.  GI: Soft and nontender.  MS: No deformity or atrophy. Gait and ROM intact. Skin: Warm and dry. Color is normal.  Neuro:  Strength and sensation are intact and no gross focal deficits noted.  Psych: Alert, appropriate and with normal affect.   LABORATORY DATA:  EKG:  EKG is ordered today. This demonstrates NSR with rate of 64.  Lab Results  Component Value Date   WBC 6.0 07/05/2014   HGB 15.0 07/05/2014   HCT 45.0 07/05/2014   PLT 107.0* 07/05/2014   GLUCOSE 95 07/31/2014   CHOL 125 07/05/2014   TRIG 83.0 07/05/2014   HDL 48.90 07/05/2014   LDLCALC 60 07/05/2014   ALT 30 12/09/2010   AST 20 12/09/2010   NA 136 07/31/2014   K 4.0 07/31/2014   CL 105 07/31/2014   CREATININE 0.82 07/31/2014   BUN 24* 07/31/2014   CO2 25 07/31/2014   TSH 2.69 12/09/2010   INR 0.9 02/18/2011   HGBA1C 5.5 07/09/2010    BNP (last 3 results) No results  for input(s): BNP in the last 8760 hours.  ProBNP (last 3 results) No results for input(s): PROBNP in the last 8760 hours.   Other Studies Reviewed Today:  Echo Study Conclusions from 2012  - Left ventricle: The cavity size was normal. Wall thickness was normal. Systolic function was mildly reduced. The estimated ejection fraction was in the range of 45% to 50%. There is hypokinesis of the basal-mid inferolateral and inferior myocardium. Doppler parameters are consistent with abnormal left ventricular relaxation (grade 1 diastolic dysfunction). - Aortic valve: Mild regurgitation.  CARDIAC CATH FROM 01/2011 ANGIOGRAPHY: Left main: The left main has minor luminal irregularities.  Left anterior descending artery: The LAD has mild in-stent restenosis of the proximal and mid stent. The vessel is very calcified. The stenosis could be as much as 40%, but does not appear to be occlusive.  There is a small diagonal branch which has minor irregularities in the proximal segment. The remaining LAD has minor luminal irregularities.  The left circumflex artery is a relatively small vessel. There is a distal stenosis of approximately 60-70%.  The right coronary artery is a large and dominant vessel. There is a very long stented segment extending from the proximal through the mid vessel. There are minor luminal irregularities within the stent and then further down on the right coronary artery. The posterior descending artery and the posterior lateral segment artery are normal.  The left ventriculogram was performed in the 30 RAO position. It reveals normal left ventricular systolic function. There are no segmental wall motion abnormalities.  COMPLICATIONS: None.  CONCLUSION: Patent stents in the LAD and right coronary artery. He will continue with medical therapy.  Thayer Headings, M.D.  Assessment/Plan: 1. Dyspnea - somewhat similar to prior  chest pain syndrome but no nausea. No left arm involvement as he had previously. NTG given here in the office with no change. Check stat Troponin, D dimer and other labs. If negative, arrange echo and Myoview. Sending for CXR as well today. Further disposition to follow. EKG is not acute.   2. CAD - updating Myoview. If his troponin is + he understands that he will be directed to the hospital. NTG with no change in symptoms. EKG negative.   3. Mild LV dysfunction - updating his echo  4. HTN - BP ok on current regimen.   5. HLD  Current medicines are reviewed with the patient today.  The patient does not have concerns regarding medicines other than what has been noted above.  The following changes have been made:  See above.  Labs/ tests ordered today include:    Orders Placed This Encounter  Procedures  . DG Chest 2 View  . Brain natriuretic peptide  . Basic metabolic panel  . CBC  . Troponin I  . D-Dimer, Quantitative  . Myocardial Perfusion Imaging  . EKG 12-Lead  . ECHOCARDIOGRAM COMPLETE     Disposition:   FU with Dr. Aundra Dubin in August as planned.   Patient is agreeable to this plan and will call if any problems develop in the interim.   Signed: Burtis Junes, RN, ANP-C 12/31/2014 9:33 AM  [facility] [address] [place], [place]  [address] Phone: [phone] Fax: [phone]

## 2014-12-31 NOTE — Patient Instructions (Addendum)
We will be checking the following labs today - BMET, CBC, Troponin, D dimer and BNP  Please go to [facility] to [place] on the first floor for a chest Xray - you may walk in.    Medication Instructions:    Continue with your current medicines.   I have refilled your NTG today    Testing/Procedures To Be Arranged:  CXR  Echo  Stress Myoview  Follow-Up:   See Dr. Aundra Dubin as planned.     Other Special Instructions:   N/A  Call the [place] office at [phone] if you have any questions, problems or concerns.

## 2014-12-31 NOTE — Telephone Encounter (Signed)
I called Heart Failure clinic and there are no available appointments with Dr. Aundra Dubin per Nira Conn, [place].  I called and advised patient that given his symptoms and no available appointments with Dr. Aundra Dubin or our FLEX APP, that I advise patient should go to the ED.  The patient became argumentative and states he does not want to spend the day in the hospital and that he doesn't understand why he has been rescheduled 3 times and can't see his own doctor.  I advised that I will double check the schedule for cancellations this morning.  After talking with our FLEX APP, Truitt Merle, NP I was able to schedule the patient at 9:00 with her today due to a cancellation.  The patient verbalized understanding and agreement with plan of care.

## 2015-01-03 ENCOUNTER — Telehealth (HOSPITAL_COMMUNITY): Payer: Self-pay

## 2015-01-03 NOTE — Telephone Encounter (Signed)
Patient given detailed instructions per Myocardial Perfusion Study Information Sheet for test on 01-08-2015 at 1230. Patient Notified to arrive 15 minutes early, and that it is imperative to arrive on time for appointment to keep from having the test rescheduled. Patient verbalized understanding. Oletta Lamas, Neriah Brott A

## 2015-01-07 ENCOUNTER — Telehealth: Payer: Self-pay | Admitting: Nurse Practitioner

## 2015-01-07 ENCOUNTER — Encounter: Payer: Self-pay | Admitting: Cardiology

## 2015-01-07 NOTE — Telephone Encounter (Signed)
I would still get the stress test.  The labs just meant that an "acute event" had not taken place.   I would reschedule.

## 2015-01-07 NOTE — Telephone Encounter (Signed)
New message      Pt has an infected tooth with fever--going to dentist this am.  He had to resc stress test for 01-22-15.  Do you think stress test is necessary since labs were normal?

## 2015-01-07 NOTE — Telephone Encounter (Signed)
S/w pt is very hesitant to take stress test  Will discuss with sons and know's to call back and cancel in the alloted time if pt does not want to do this test. Would like to s/w Dr. Aundra Dubin before any testing.  Pt stated appreciation for calling back.

## 2015-01-08 ENCOUNTER — Encounter (HOSPITAL_COMMUNITY): Payer: Medicare Other

## 2015-01-08 ENCOUNTER — Other Ambulatory Visit (HOSPITAL_COMMUNITY): Payer: Medicare Other

## 2015-01-16 ENCOUNTER — Telehealth (HOSPITAL_COMMUNITY): Payer: Self-pay

## 2015-01-16 NOTE — Telephone Encounter (Signed)
Left message on voicemail in reference to upcoming appointment scheduled for 01-22-2015. Phone number given for a call back so details instructions can be given. Willie Lynch A   

## 2015-01-22 ENCOUNTER — Other Ambulatory Visit (HOSPITAL_COMMUNITY): Payer: Medicare Other

## 2015-01-22 ENCOUNTER — Encounter (HOSPITAL_COMMUNITY): Payer: Medicare Other

## 2015-01-22 ENCOUNTER — Other Ambulatory Visit: Payer: Self-pay | Admitting: Cardiology

## 2015-01-23 ENCOUNTER — Encounter: Payer: Self-pay | Admitting: Cardiology

## 2015-01-23 ENCOUNTER — Encounter: Payer: Self-pay | Admitting: *Deleted

## 2015-01-23 ENCOUNTER — Ambulatory Visit (INDEPENDENT_AMBULATORY_CARE_PROVIDER_SITE_OTHER): Payer: Medicare Other | Admitting: Cardiology

## 2015-01-23 VITALS — BP 118/58 | HR 70 | Ht 70.0 in | Wt 183.0 lb

## 2015-01-23 DIAGNOSIS — I2510 Atherosclerotic heart disease of native coronary artery without angina pectoris: Secondary | ICD-10-CM

## 2015-01-23 DIAGNOSIS — R0602 Shortness of breath: Secondary | ICD-10-CM

## 2015-01-23 DIAGNOSIS — R0600 Dyspnea, unspecified: Secondary | ICD-10-CM

## 2015-01-23 DIAGNOSIS — E7800 Pure hypercholesterolemia, unspecified: Secondary | ICD-10-CM

## 2015-01-23 MED ORDER — ALBUTEROL SULFATE HFA 108 (90 BASE) MCG/ACT IN AERS: 2.0000 | INHALATION_SPRAY | Freq: Four times a day (QID) | RESPIRATORY_TRACT | Status: DC | PRN

## 2015-01-23 NOTE — Patient Instructions (Signed)
Medication Instructions:  Use albuterol inhaler 2 puffs every 6 hours as needed for shortness of breath  Labwork: None.  Testing/Procedures: Your physician has requested that you have an echocardiogram. Echocardiography is a painless test that uses sound waves to create images of your heart. It provides your doctor with information about the size and shape of your heart and how well your heart's chambers and valves are working. This procedure takes approximately one hour. There are no restrictions for this procedure.    Follow-Up: Your physician recommends that you schedule a follow-up appointment in: 4 months with Dr Aundra Dubin.    Thank you for choosing [facility]!!

## 2015-01-24 NOTE — Progress Notes (Signed)
Patient ID: Willie Lynch, male   DOB: 07-Jul-1933, 81 y.o.   MRN: [id number] PCP: Dr. Lenna Gilford  81 yo with NSTEMI in 5/10 s/p PCI to LAD and RCA.  In 2012, he had trouble with exertional dyspnea.  He did an ETT-myoview in 6/12 showing no ischemia or infarction.  Echo showed EF 45-50% (stable from prior).  Dyspnea improved for a while then worsened again. This time, he had a repeat cath (8/12).  This showed nonobstructive disease with patent stents.  BNP has not been elevated.  PFTs in 10/12 were normal.  I had him stop Coreg and start nebivolol instead with the thought that dyspnea could be related to beta-2 blockade and use of a more selective beta-1 blocker may help.  This seemed to help his dyspnea.   BP is doing better on current regimen.  118/50 today.  Weight is down 3 lbs.   In 7/16, [name] developed significant exertional dyspnea one weekend.  No cough or fever.  He saw Truitt Merle as a work-in.  He was arranged to get a Cardiolite and an echo, but he cancelled both appointments.  The acute dyspnea completely resolved.  He is back to his baseline.  He is still mildly short of breath if he walks fast up steps but is able to exercise on the treadmill and elliptical for 30 minutes with no problems.  He does this 5-6 days/week. He plays golf without dyspnea.  No chest pain.    Labs (3/11): K 4.2, creatinine 0.7  Labs (4/11): LFTs nromal, LDL 43, HDL 35  Labs (10/11): HDL 34, LDL 58, LFTs normal  Labs (11/11): K 4.1, creatinine 0.7  Labs (6/12): K 4.1, creatinine 0.8, TSH normal, BNP 19 Labs (7/12): LDL 64, HDL 46 Labs (8/12): K 3.9, creatinine 0.8 Labs (2/13): LDL 41, HDL 23 Labs (8/13): LDL 45, HDL 42, K 3.9, creatinine 0.7 Labs (1/15): K 3.9, creatinine 1.0, LDL 69, HDL 31, HCT 40.7 Labs (1/16): K 4.2, creatinine 0.84, HCT 45, LDL 60, HDL 49 Labs (7/16): BNP 63, K 3.8, creatinine 0.79, HCT 42.6  Allergies:  1) ! Codeine  2) ! Morphine  3) ! Augmentin (Amoxicillin-Pot Clavulanate)    Past Medical History:  1. CAD: NSTEMI 5/10. Rotational atherectomy/PCI with Xience DES x 3 to RCA and rotational atherectomy/PCI with Xience DES to proximal LAD. ETT-myoview (6/12): 7:33, no ischemic ECG changes, EF 59%, no ischemia or infarction on perfusion images.  Left heart cath (8/12): 60-70% distal CFX, 40% in-stent restenosis in proximal LAD.   2. Hyperlipidemia 3. ALLERGIC RHINITIS  4. DIVERTICULOSIS OF COLON - last colon 11/05 w/ divertics only.  5. IRRITABLE BOWEL SYNDROME 6. Hx of ELEVATED PROSTATE SPECIFIC ANTIGEN - followed by Dr Terance Hart for urology  7. Hx of RENAL CYST  8. OSTEOARTHRITIS   9. LOW BACK PAIN, CHRONIC   10. Hx of MIGRAINE HEADACHE  11. HTN: ACEI cough.  12. Esophagitis/GERD  13. Ischemic cardiomyopathy: Mild. Echo (8/10) with EF 45-50%, diffuse hypokinesis, mild AI and mild MR.  Echo (6/12): EF 45-50%, basal to mid inferoposterior hypokinesis, mild aortic insufficiency, grade I diastolic dysfunction.  LV-gram 8/12 with EF 55%.  14. Dyspnea: PFTs normal.  Possible intolerance to Coreg.   Family History:  mother died in her early 39s from metastatic cancer, family believes it may have been colon cancer that was diagnosed in her 61s  Father deceased age 67 - heart disease  One sibling alive age 25  One sibling deceased  age 7 - unknown  One sibling deceased age 74 -unknown   Social History:  Quit smoking 1963  Alcohol - 4 drinks per week  Exercises  Caffeine - 2 cups per day  Widowed 2 children  Previously worked for [employer], now Optometrist.   ROS: All systems reviewed and negative except as per HPI.    Current Outpatient Prescriptions  Medication Sig Dispense Refill  . alfuzosin (UROXATRAL) 10 MG 24 hr tablet Take 10 mg by mouth daily.      Marland Kitchen ALPRAZolam (XANAX) 0.5 MG tablet Take 1/2 to 1 tablet by mouth three times daily as needed for nerves 90 tablet 5  . amLODipine (NORVASC) 5 MG tablet Take 1 tablet (5 mg total) by mouth daily. 30  tablet 6  . aspirin (ASPIR-LOW) 81 MG EC tablet Take 81 mg by mouth daily. 2 tablets (total 162mg ) daily    . BYSTOLIC 5 MG tablet take 1/2 tablet by mouth once daily 15 tablet 5  . cetirizine (ZYRTEC ALLERGY) 10 MG tablet Take 10 mg by mouth as needed.      . clindamycin (CLEOCIN) 150 MG capsule Take 2 capsules immediately then take 1 capsule by mouth every 6 hours until finished  0  . clopidogrel (PLAVIX) 75 MG tablet take 1 tablet by mouth once daily 30 tablet 5  . CRESTOR 40 MG tablet take 1 tablet by mouth once daily 90 tablet 1  . diphenhydrAMINE (BENADRYL) 25 MG tablet Take 25 mg by mouth as needed. For itching      . famotidine (PEPCID) 20 MG tablet Take 20 mg by mouth 2 (two) times daily.     . finasteride (PROSCAR) 5 MG tablet Take 5 mg by mouth daily.      Marland Kitchen losartan (COZAAR) 50 MG tablet take 1 tablet by mouth twice a day 180 tablet 1  . Multiple Vitamins-Minerals (MULTIVITAL) tablet Take 1 tablet by mouth daily.      . nitroGLYCERIN (NITROSTAT) 0.4 MG SL tablet DISSOLVE 1 TABLET UNDER THE TONGUE IF NEEDED AS DIRECTED. 25 tablet 3  . Omega-3 Fatty Acids (FISH OIL) 1000 MG CAPS Take by mouth daily.     . pramipexole (MIRAPEX) 0.5 MG tablet take 1 to 2 tablets by mouth once daily at bedtime 60 tablet 2  . Timolol Maleate 0.5 % (DAILY) SOLN Apply to eye daily. Instill one droplet in OU    . travoprost, benzalkonium, (TRAVATAN) 0.004 % ophthalmic solution Place 1 drop into both eyes at bedtime.      Marland Kitchen VIAGRA 100 MG tablet take 1 tablet by mouth once daily if needed for ERECTILE DYSFUNCTION 10 tablet 0  . albuterol (PROVENTIL HFA;VENTOLIN HFA) 108 (90 BASE) MCG/ACT inhaler Inhale 2 puffs into the lungs every 6 (six) hours as needed for wheezing or shortness of breath. 1 Inhaler 2   No current facility-administered medications for this visit.    BP 118/58 mmHg  Pulse 70  Ht 5\' 10"  (1.778 m)  Wt 183 lb (83.008 kg)  BMI 26.26 kg/m2  SpO2 97% General: NAD Neck: No JVD, no  thyromegaly or thyroid nodule.  Lungs: Clear to auscultation bilaterally with normal respiratory effort. CV: Nondisplaced PMI.  Heart regular S1/S2, no S3/S4, no murmur.  No peripheral edema.  No carotid bruit.  Normal pedal pulses.  Abdomen: Soft, nontender, no hepatosplenomegaly, no distention.  Neurologic: Alert and oriented x 3.  Psych: Normal affect. Extremities: No clubbing or cyanosis.   Assessment/Plan  CORONARY ATHEROSCLEROSIS  No obstructive coronary disease on cath 8/12. Continue ASA 81, beta blocker, Crestor, losartan.  Given multiple stents (DES) will continue Plavix.  He has had no bleeding problems.  Dyspnea  Acute dyspnea one weekend in 7/16.  This cleared up completely without intervention.  No associated chest pain.  I wonder if this may not have been related to perhaps an allergic reaction with bronchospasm.  I am going to give him an albuterol MDI to try if he has another similar episode of dyspnea.  I will have him get an echo, but given lack of current exertional symptoms, I do not think he needs to have a Cardiolite.   HYPERCHOLESTEROLEMIA Good lipids when recently checked. HTN BP is improved.    Loralie Champagne 01/24/2015

## 2015-01-31 ENCOUNTER — Ambulatory Visit (HOSPITAL_COMMUNITY): Payer: Medicare Other | Attending: Internal Medicine

## 2015-01-31 ENCOUNTER — Other Ambulatory Visit: Payer: Self-pay

## 2015-01-31 DIAGNOSIS — I351 Nonrheumatic aortic (valve) insufficiency: Secondary | ICD-10-CM | POA: Diagnosis not present

## 2015-01-31 DIAGNOSIS — E785 Hyperlipidemia, unspecified: Secondary | ICD-10-CM | POA: Diagnosis not present

## 2015-01-31 DIAGNOSIS — I340 Nonrheumatic mitral (valve) insufficiency: Secondary | ICD-10-CM | POA: Diagnosis not present

## 2015-01-31 DIAGNOSIS — R0600 Dyspnea, unspecified: Secondary | ICD-10-CM | POA: Diagnosis present

## 2015-01-31 DIAGNOSIS — I371 Nonrheumatic pulmonary valve insufficiency: Secondary | ICD-10-CM | POA: Insufficient documentation

## 2015-01-31 DIAGNOSIS — R0602 Shortness of breath: Secondary | ICD-10-CM

## 2015-01-31 DIAGNOSIS — I358 Other nonrheumatic aortic valve disorders: Secondary | ICD-10-CM | POA: Diagnosis not present

## 2015-02-02 ENCOUNTER — Other Ambulatory Visit: Payer: Self-pay | Admitting: Pulmonary Disease

## 2015-02-19 DIAGNOSIS — M1711 Unilateral primary osteoarthritis, right knee: Secondary | ICD-10-CM | POA: Diagnosis not present

## 2015-03-01 ENCOUNTER — Other Ambulatory Visit: Payer: Self-pay | Admitting: Cardiology

## 2015-03-01 DIAGNOSIS — M1711 Unilateral primary osteoarthritis, right knee: Secondary | ICD-10-CM | POA: Diagnosis not present

## 2015-03-11 DIAGNOSIS — H401233 Low-tension glaucoma, bilateral, severe stage: Secondary | ICD-10-CM | POA: Diagnosis not present

## 2015-04-01 ENCOUNTER — Other Ambulatory Visit: Payer: Self-pay | Admitting: Adult Health

## 2015-05-13 ENCOUNTER — Other Ambulatory Visit: Payer: Self-pay | Admitting: Pulmonary Disease

## 2015-05-15 ENCOUNTER — Telehealth: Payer: Self-pay | Admitting: Pulmonary Disease

## 2015-05-15 MED ORDER — PRAMIPEXOLE DIHYDROCHLORIDE 0.5 MG PO TABS: ORAL_TABLET | ORAL | Status: DC

## 2015-05-15 NOTE — Telephone Encounter (Signed)
Pt returning call, said that he will call back this evening, said he wouldn't be home.Willie Lynch

## 2015-05-15 NOTE — Telephone Encounter (Signed)
Refill has been sent in. Pt is aware by message since we are closing early today.

## 2015-05-15 NOTE — Telephone Encounter (Signed)
lmtcb x1 for pt. 

## 2015-05-31 ENCOUNTER — Other Ambulatory Visit: Payer: Self-pay | Admitting: Cardiology

## 2015-06-06 ENCOUNTER — Ambulatory Visit: Payer: Medicare Other | Admitting: Cardiology

## 2015-06-13 ENCOUNTER — Ambulatory Visit (INDEPENDENT_AMBULATORY_CARE_PROVIDER_SITE_OTHER): Payer: Medicare Other | Admitting: Pulmonary Disease

## 2015-06-13 ENCOUNTER — Encounter: Payer: Self-pay | Admitting: Pulmonary Disease

## 2015-06-13 VITALS — BP 128/70 | HR 66 | Temp 97.7°F | Ht 70.0 in | Wt 194.0 lb

## 2015-06-13 DIAGNOSIS — R0600 Dyspnea, unspecified: Secondary | ICD-10-CM | POA: Diagnosis not present

## 2015-06-22 ENCOUNTER — Encounter: Payer: Self-pay | Admitting: Pulmonary Disease

## 2015-06-22 NOTE — Progress Notes (Signed)
Subjective:    Patient ID: Willie Lynch, male    DOB: [birth year], 81 y.o.   MRN: [id number]  HPI 80( y/o WM whom I last saw in 2009 for a routine follow up... He has a hx CAD- NSTEMI 5/10 w/ PCI to LAD & RCA, & followed by DrMcLean on ASA, Plavix, Losartan; and Hyperchol on Cres40... ~  SEE PREV EPIC NOTES FOR OLDER DATA >>    CT Chest 08/15/04 showed norm heart size & config, mild aortic elongation/ no aneurysm, clear lungs x mild diffuse peribronch thickening- no effusion or pneumothorax, no adenopathy, abd- neg x simple cysts in both kidneys...   FullPFT 03/23/11 showed FVC=4.26 (100), FEV1=3.17 (114%), %1sec=74, mid-flows wnl at 99% predicted; no change post bronchodil; TLC=6.40 (99%), RV=2.13 (81%), RV/TLC=33;  DLCO=83% predicted...   ~  Nov 08, 2013:  82mo ROV & Willie Lynch's wife Eilert Ocheltree died last week in [facility], he is quite tearful but processing his loss quite appropriately; he notes incr RLS symptoms & we discussed incr in his Mirapex to 0.5mg - 1to2 tabs qhs as needed, and he requests something for his nerves- try Xanax0.5,g 1/2-1 tab Tid prn... We reviewed the following medical problems during today's office visit >>     AR> on Zyrtek; he has mild seasonal symptoms... Note: PFTs & CXR were wnl...    HBP> on [id number] & back on Bystolic5-1/2tab/d; Hx of ACE cough in past; BP= 116/66 & he denies CP, palpit, SOB, edema...    CAD, s/pNSTEMI> on ASA81 & Plavix75; he had f/u DrMcLean 1/15> Hx NSTEMI w/ PCI to LAD & RCA in 2010; Myoview 6/12 was neg for ischemia & 2DEcho showed EF=45-50% (stable, no change); repeat cath 8/12 showed patent stents and nonobstructive dis...    Hyperlipid> on Cres40 + FishOil; last FLP 1/15 showed TChol 136, TG 180, HDL 31, LDL 69    GERD> on Pepcid20Bid; he denies abd pain, n/v, c/d, blood seen...    Divertics, IBS> followed by DrJacobs and he has never had a polyp but +FamHx colon ca in his mother; f/u colon due 2015...    Hx BPH, elev PSA, ED> on  Uroxatrol10, Proscar5, Viagra100; followed by DrEskridge; seen 10/13 and stable w/ PSA reported normal per pt...    DJD, LBP> on OTC analgesics as needed; he is followed by [facility] [place] w/ shots in his back periodically which really help per pt...    RLS> on Mirapex0.25 taking 1-2 Qhs but notes incr symptoms and we will double to Mirapex0.5mg - 1-2Qhs for symptoms...    Anxiety & Insomnia> on Benedryl25 prn; he's been axious since the passing of his wife 5/15- add Xanax 0.5mg  1/2 to 1 tab tid prn... We reviewed prob list, meds, xrays and labs> see below for updates >>   EKG 1/15 showed NSR, rate71, wnl, NAD...   LABS 1/15:  FLP- ok on Cres40 but TG=180 & HDL=31, advised low fat diet & incr exercise; Chems- wnl;  CBC- wnl...  ~  June 13, 2015:  67mo Willie Lynch continues his regular f/u w/ [facility]- DrEskridge, they do his labs... He presents w/ 2 main concerns>  1) RLS- on Mirapex0.5-2Qhs which helps about 60% of the time, the other 40% he can't sleep; we discussed trying to increase the Mirapex0.5 to 3 tabs Qhs & try taking it about 1H prior to bedtime; he will let me know how this is working for him...  2) Breathing issue- "DrMcLean said I  have asthma" prescribed AlbutHFA for prn use & this helps a little he says; he is c/o SOB/ DOE eg w/ stairs and golfing but denies chest tightness/ wheezing/ etc; he has a mild AM cough, no sput, no hemoptysis; he does 47min exercise 5d/wk (bike & elliptical), plus golfing;  Spirometry today is wnl & he is rec to continue the AlbutHFA prn wheezing... We reviewed the following medical problems during today's office visit >>     AR> on Zyrtek; he has mild seasonal symptoms... Note: PFTs & CXR were wnl...    HBP> on [id number], Amlod5, Bystolic5-1/2tab/d; Hx of ACE cough in past; BP= 128/70 & he denies CP, palpit, dizzy, edema; he has DOE that is surely multifactorial in nature & not progressive...    CAD, s/pNSTEMI> on ASA81 &  Plavix75; he had f/u DrMcLean 8/16 (note reviewed)> Hx NSTEMI w/ PCI to LAD & RCA in 2010; Myoview 6/12 was neg for ischemia & 2DEcho showed EF=45-50% (stable, no change); repeat cath 8/12 showed patent stents and nonobstructive dis; his dyspnea was sl better after switching Coreg to Bystolic; he remains active & the DOE seems multifactorial...    Hyperlipid> on Cres40 + FishOil; last FLP 1/16 showed TChol 125, TG 83, HDL 49, LDL 60 => much improved continue same med + diet/ exercise...    GERD> on Pepcid20Bid; he denies abd pain, n/v, c/d, blood seen... DrJacobs sent him a letter to f/u w/ him to discuss f/u colon...    Divertics, IBS> followed by DrJacobs and he has never had a polyp but +FamHx colon ca in his mother; last colonoscopy 1/05 (DrLeB) showed mod severe divertics, no polyp; he had f/u w/ DrJacobs 1/15 & they agreed to suspend f/u colons.    Hx BPH, elev PSA, ED> on Uroxatrol10, Proscar5, Viagra100; followed by DrEskridge; seen 11/15 and stable w/ PSA reported normal per pt...    DJD, LBP> on OTC analgesics as needed; he is followed by [facility] [place] w/ shots in his back periodically which really help per pt...    RLS> on Mirapex0.25 taking 1-2 Qhs but notes incr symptoms and we discussed incr Mirapex0.25 to 3tabs taken about 1H prior to bedtime & he will let me know how well this is working for him...    Anxiety & Insomnia> on Benedryl25 prn; he's been axious since the passing of his wife 5/15- add Xanax 0.5mg  1/2 to 1 tab tid prn... EXAM shows Afeb, VSS, O2sat=97% on RA at rest;  HEENT- neg, mallamapti1;  Chest- clear w/o w/r/r;  Heart- RR w/o m/r/g;  Abd- soft, nontender;  Ext- neg w/o c/c/e;  Neuro- intact...  Last CXR was 12/31/14 showing norm heart size, clear lungs/ NAD, mild DJD in Tspine...   Last 2DEcho 01/31/15 showed mild LVH, norm LVF w/ EF=50-55%, mild infer HK, Gr1DD, AoV sclerosis, mild AI, triv MR & calcif annulus, no real change from 2012...   Spirometry  06/13/15 showed FVC=4.08 (94%), FEV1=3.26 (100%), %1sec=80, mid-flows are wnl at 120% predicted=> the numbers are normal, the tracing showed some difficulty w/ the testing procedure.  LABS in Epic 2016 reviewed>  FLP- at goals,  Chems- wnl,  BNP=63,  CBC- wnl..dermatitis-dimer- neg...  IMP/PLAN>>  Willie Lynch's dyspnea appears multifactorial and non-progressive, he is able to exercise 5d/wk & play golf w/o problems; OK to use the AlbutHFA prn for wheezing & continue f/u w/ [facility]/ DrMcLean;  His RLS may respond to an incr in his Mirapex0.25- 3Qhs & takeit about 1H prior  to sleep;  We reviewed his numerous medcal issues as above... ROV 41yr, sooner if needed prn...           Problem List:   GLAUCOMA>  On eye drops from [place]...  ALLERGIC RHINITIS (ICD-477.9) - on ZYRTEK 10mg  prn, Benedryl prn... ~  CT Chest 08/15/04 showed norm heart size & config, mild aortic elongation/ no aneurysm, clear lungs x mild diffuse peribronch thickening- no effusion or pneumothorax, no adenopathy, abd- neg x simple cysts in both kidneys...  ~  CXR 8/12 showed borderline heart size, ectasia & calcif thorAo; clear lungs, mild degen spondylosis, prev right shoulder surg... ~  FullPFT 03/23/11 showed FVC=4.26 (100), FEV1=3.17 (114%), %1sec=74, mid-flows wnl at 99% predicted; no change post bronchodil; TLC=6.40 (99%), RV=2.13 (81%), RV/TLC=33;  DLCO=83% predicted...  ~  Last CXR was 12/31/14 showing norm heart size, clear lungs/ NAD, mild DJD in Tspine...  ~  Spirometry 06/13/15 showed FVC=4.08 (94%), FEV1=3.26 (100%), %1sec=80, mid-flows are wnl at 120% predicted=> the numbers are normal, the tracing showed some difficulty w/ the testing procedure.  Hx HBP w/ ACE cough>  On LOSARTAN 50mg /d & COREG 6.25mg  Bid...  BP= 120/68 & he denies CP or palpit, but as noted is c/o no energy/ feeling poorly/ "indigestion" etc. ~  CXR 5/10 showed tort thor Ao, clear lungs, post op changes right shoulder ~  2DEcho 6/12 showed norm wall  thickness, reduced EF=45-50% w/ HK basal, inferior, lateral walls; Gr1DD, mild AI.Marland Kitchen. ~  5/15:  on [id number] & back on Bystolic5-1/2tab/d; Hx of ACE cough in past; BP= 116/66 & he denies CP, palpit, SOB, edema.. ~  Last 2DEcho 01/31/15 showed mild LVH, norm LVF w/ EF=50-55%, mild infer HK, Gr1DD, AoV sclerosis, mild AI, triv MR & calcif annulus, no real change from 2012...   CAD/ NSTEMI 5/10 w/ PCI LAD & RCA>  Followed regularly by DrMcLean for Cards on ASA 81mg /d, PLAVIX 75mg /d, plus the Coreg, Losartan, Crestor, NTG prn. Ischemic Cardiomyopathy w/ diffuse HK on Echo 8/10 & EF= 45-50%, mild AI/ MR... ~  Hosp 5/10 by DrMcLean/ Cards> CAD & NSTEMI w/ Rotational atherectomy/PCI with Xience DES x 3 to RCA and rotational atherectomy/PCI with Xience DES to proximal LAD.  ~  EKG 6/12 showed SBrady, rate 58/min, otherw WNL... ~  1/14: Cards f/u by DrMcLean> hx NSTEMI 5/10 w/ PCI to LAD & RCA; c/o exertional dyspnea> ETT-Myoview 6/12 was neg w/o ischemia or infarction; 2DEcho 6/12 w/ EF=45-50% (no ch from prev); repeat cath 8/12 showed nonobstructive dis w/ patent stents; Dyspnea ultimately believed due to BBlockers as he had difficulty w/ Coreg & Bystolic- better off these meds... EKG showed SBrady, rate56, wnl, NAD.Marland Kitchen. ~  5/15: on ASA81 & Plavix75; he had f/u DrMcLean 1/15> Hx NSTEMI w/ PCI to LAD & RCA in 2010; Myoview 6/12 was neg for ischemia & 2DEcho showed EF=45-50% (stable, no change); repeat cath 8/12 showed patent stents and nonobstructive dis. ~   8/16: f/u DrMcLean (note reviewed)> Hx NSTEMI w/ PCI to LAD & RCA in 2010; Myoview 6/12 was neg for ischemia & 2DEcho showed EF=45-50% (stable, no change); repeat cath 8/12 showed patent stents and nonobstructive dis; his dyspnea was sl better after switching Coreg to Bystolic; he remains active & the DOE seems multifactorial.  HYPERLIPIDEMIA>  On diet + CRESTOR 40mg /d and FISH OIL 1000mg  Bid... ~  FLP 10/11 on Cres40 showed TChol 112, TG 100, HDL 34, LDL  58 ~  FLP 7/12 on Cres40 showed TChol 123, TG 68,  HDL 46, LDL 64 ~  FLP 8/13 on Cres40 showed TChol 108, TG 104, HDL 42, LDL 45  ~  FLP 1/15 on Cres40 showed TChol 136, TG 180, HDL 31, LDL 69  ~  FLP 1/16 on Cres40 showed TChol 125, TG 83, HDL 49, LDL 60  GERD/ Esophagitis>  On PEPCID which he takes Bid & not on PPI due to his Plavix rx... ~  Labs 6/12 showed Hg= 14.7, MCV= 93... ~  CBC has remained wnl...   DIVERTICULOSIS OF COLON (ICD-562.10) - last colon 11/05 by DrSamLeB w/ divertics only... He saw DrJacobs 10/11 & they decided on f/u colon in 2015 despite family hx of colon cancer in mother since he has never had a polyp etc... Hx of IRRITABLE BOWEL SYNDROME (ICD-564.1) - he denies recent abd pain, n/v, change in bowels, etc... ~  1/15: he saw DrJacobs> divertics, neg colon 2005, on Plavix- they decided to forgo any further colon cancer screening...   Hx of ELEVATED PROSTATE SPECIFIC ANTIGEN (ICD-790.93) - followed by DrPeterson for Urology on UROXATRAL 10mg /d, & PROSCAR 5mg /d;  He tells me that DrPeterson follows his PSAs, we do not have any recent notes from Urology. Hx of RENAL CYST (ICD-593.2) ~  10/13: he had f/u DrEskridge> BPH w/ BOO, elev PSA, ED; stable on [place]; he tells me his PSA was "fine"...  OSTEOARTHRITIS (ICD-715.90) LOW BACK PAIN, CHRONIC (ICD-724.2) - severe back discomfort w/ eval by ortho (DrOlin & Ramos) resulting in epid steroid shots, a round of prednisone, and percocet + advil for pain...   Hx of MIGRAINE HEADACHE (ICD-346.90) - he denies recent migraine problem...  INSOMNIA>  On BENEDRYL 25mg  prn use...  HEALTH MAINTENANCE: ~  GI:  Dr Ardis Hughs follows & f/u colon due 2015... ~  GU:  Followed by DrPeterson & he does his PSAs... ~  Immunizations:  He received Shingles vaccine in 2011   Past Medical History  Diagnosis Date  . CAD (coronary artery disease)     NSTEMI 5/10. Rotational atherectomy/PCI w Xience DES x 3 to RCA and rotation  atherectomy /PCI w Xience DES to prox LAD  . Abdominal pain, unspecified site   . Allergic rhinitis, cause unspecified   . Diverticulosis of colon (without mention of hemorrhage)     last colon 11/05 by DrSamLeB w divertics only  . Irritable bowel syndrome   . Elevated prostate specific antigen (PSA)     followed by Dr Terance Hart for urology  . Acquired cyst of kidney   . Osteoarthrosis, unspecified whether generalized or localized, unspecified site   . Lumbago   . Migraine, unspecified, without mention of intractable migraine without mention of status migrainosus   . Hypertension     ACEI cough  . GERD (gastroesophageal reflux disease)     esophagitis  . Hyperlipidemia   . Ischemic cardiomyopathy     mild echo (8/10) w EF 45-50%, diffuse hypokinesis, mild AI and mild MR    Past Surgical History  Procedure Laterality Date  . Rotator cuff surgery      Dr. Shellia Carwin  . Bilat inguinal hernia repairs  7/06    Dr Hassell Done  . Angioplasty       Outpatient Encounter Prescriptions as of 06/13/2015  Medication Sig  . albuterol (PROVENTIL HFA;VENTOLIN HFA) 108 (90 BASE) MCG/ACT inhaler Inhale 2 puffs into the lungs every 6 (six) hours as needed for wheezing or shortness of breath.  . alfuzosin (UROXATRAL) 10 MG 24 hr tablet Take 10  mg by mouth daily.    Marland Kitchen ALPRAZolam (XANAX) 0.5 MG tablet Take 1/2 to 1 tablet by mouth three times daily as needed for nerves  . amLODipine (NORVASC) 5 MG tablet Take 1 tablet (5 mg total) by mouth daily.  Marland Kitchen aspirin (ASPIR-LOW) 81 MG EC tablet Take 81 mg by mouth daily. 2 tablets (total 162mg ) daily  . BYSTOLIC 5 MG tablet take 1/2 tablets by mouth once daily  . cetirizine (ZYRTEC ALLERGY) 10 MG tablet Take 10 mg by mouth as needed.    . clopidogrel (PLAVIX) 75 MG tablet take 1 tablet by mouth once daily  . diphenhydrAMINE (BENADRYL) 25 MG tablet Take 25 mg by mouth as needed. For itching    . famotidine (PEPCID) 20 MG tablet Take 20 mg by mouth 2 (two) times  daily.   . finasteride (PROSCAR) 5 MG tablet Take 5 mg by mouth daily.    Marland Kitchen losartan (COZAAR) 50 MG tablet take 1 tablet by mouth twice a day  . Multiple Vitamins-Minerals (MULTIVITAL) tablet Take 1 tablet by mouth daily.    . nitroGLYCERIN (NITROSTAT) 0.4 MG SL tablet DISSOLVE 1 TABLET UNDER THE TONGUE IF NEEDED AS DIRECTED.  . Omega-3 Fatty Acids (FISH OIL) 1000 MG CAPS Take by mouth daily.   . pramipexole (MIRAPEX) 0.5 MG tablet take 1-2 tablet by mouth once daily at bedtime  . rosuvastatin (CRESTOR) 40 MG tablet take 1 tablet by mouth once daily  . Timolol Maleate 0.5 % (DAILY) SOLN Apply to eye daily. Instill one droplet in OU  . travoprost, benzalkonium, (TRAVATAN) 0.004 % ophthalmic solution Place 1 drop into both eyes at bedtime.    Marland Kitchen VIAGRA 100 MG tablet take 1 tablet by mouth once daily if needed for ERECTILE DYSFUNCTION  . [DISCONTINUED] clindamycin (CLEOCIN) 150 MG capsule Take 2 capsules immediately then take 1 capsule by mouth every 6 hours until finished   No facility-administered encounter medications on file as of 06/13/2015.    Allergies  Allergen Reactions  . Amoxicillin-Pot Clavulanate     REACTION: pt developed HIVES on augmentin  . Codeine   . Morphine    ACE inhibitors:  Lisinopril w/ cough...     Current Medications, Allergies, Past Medical History, Past Surgical History, Family History, and Social History were reviewed in [employer] record.    Review of Systems       See HPI - all other systems neg except as noted...      The patient denies anorexia, fever, weight loss, weight gain, vision loss, decreased hearing, hoarseness, chest pain, syncope, peripheral edema, prolonged cough, hemoptysis, abdominal pain, melena, hematochezia, severe indigestion/heartburn, hematuria, incontinence, muscle weakness, suspicious skin lesions, transient blindness, difficulty walking, depression, unusual weight change, abnormal bleeding, enlarged lymph  nodes, and angioedema.     Objective:   Physical Exam    WD, WN, 81 y/o WM in NAD...  GENERAL:  Alert & oriented; pleasant & cooperative. HEENT:  [place]/AT, EOM-wnl, PERRLA, EACs-clear, TMs-wnl, NOSE-clear, THROAT-clear & wnl. NECK:  Supple w/ full ROM; no JVD; normal carotid impulses w/o bruits; no thyromegaly or nodules palpated; no lymphadenopathy. CHEST:  Clear to P & A; without wheezes/ rales/ or rhonchi. HEART:  Regular Rhythm; without murmurs/ rubs/ or gallops. ABDOMEN:  Soft & nontender; normal bowel sounds; no organomegaly or masses detected. EXT: without deformities, mild arthritic changes; no varicose veins/ venous insuffic/ or edema. NEURO:  CN's intact; motor testing normal; sensory testing normal; gait normal & balance OK. DERM:  No lesions noted; no rash etc...  RADIOLOGY DATA:  Reviewed in the EPIC EMR & discussed w/ the patient...  LABORATORY DATA:  Reviewed in the EPIC EMR & discussed w/ the patient...   Assessment & Plan:    RLS>  We discussed increasing the MIRAPEX0.25 to 3 tabs taken 1H prior to bedtime...   CAD/ s/p NSTEMI 5/10 w/ PTCA & stents/ Ischemic Cardiomyopathy>  Followed by drMcLean 7 stable...  CHOL>  Stable on the Cres40...  GI Symptoms/ GERD/ IBS/ Gas>  Followed by DrJacobs, on Pepcid Bid & add Mylicon, other Simethacone containing gas therapies...  GU>  Hx elev PSA>  All followed by DrEskridge...  LBP/ DJD>  Followed by DrOlin & Ramos...  Anxiety>  On Xanax0.5mg  prn use...   Patient's Medications  New Prescriptions   No medications on file  Previous Medications   ALBUTEROL (PROVENTIL HFA;VENTOLIN HFA) 108 (90 BASE) MCG/ACT INHALER    Inhale 2 puffs into the lungs every 6 (six) hours as needed for wheezing or shortness of breath.   ALFUZOSIN (UROXATRAL) 10 MG 24 HR TABLET    Take 10 mg by mouth daily.     ALPRAZOLAM (XANAX) 0.5 MG TABLET    Take 1/2 to 1 tablet by mouth three times daily as needed for nerves   AMLODIPINE (NORVASC) 5 MG  TABLET    Take 1 tablet (5 mg total) by mouth daily.   ASPIRIN (ASPIR-LOW) 81 MG EC TABLET    Take 81 mg by mouth daily. 2 tablets (total 162mg ) daily   BYSTOLIC 5 MG TABLET    take 1/2 tablets by mouth once daily   CETIRIZINE (ZYRTEC ALLERGY) 10 MG TABLET    Take 10 mg by mouth as needed.     CLOPIDOGREL (PLAVIX) 75 MG TABLET    take 1 tablet by mouth once daily   DIPHENHYDRAMINE (BENADRYL) 25 MG TABLET    Take 25 mg by mouth as needed. For itching     FAMOTIDINE (PEPCID) 20 MG TABLET    Take 20 mg by mouth 2 (two) times daily.    FINASTERIDE (PROSCAR) 5 MG TABLET    Take 5 mg by mouth daily.     LOSARTAN (COZAAR) 50 MG TABLET    take 1 tablet by mouth twice a day   MULTIPLE VITAMINS-MINERALS (MULTIVITAL) TABLET    Take 1 tablet by mouth daily.     NITROGLYCERIN (NITROSTAT) 0.4 MG SL TABLET    DISSOLVE 1 TABLET UNDER THE TONGUE IF NEEDED AS DIRECTED.   OMEGA-3 FATTY ACIDS (FISH OIL) 1000 MG CAPS    Take by mouth daily.    PRAMIPEXOLE (MIRAPEX) 0.5 MG TABLET    take 3 tablets by mouth once daily at bedtime   ROSUVASTATIN (CRESTOR) 40 MG TABLET    take 1 tablet by mouth once daily   TIMOLOL MALEATE 0.5 % (DAILY) SOLN    Apply to eye daily. Instill one droplet in OU   TRAVOPROST, BENZALKONIUM, (TRAVATAN) 0.004 % OPHTHALMIC SOLUTION    Place 1 drop into both eyes at bedtime.     VIAGRA 100 MG TABLET    take 1 tablet by mouth once daily if needed for ERECTILE DYSFUNCTION  Modified Medications   No medications on file  Discontinued Medications   CLINDAMYCIN (CLEOCIN) 150 MG CAPSULE    Take 2 capsules immediately then take 1 capsule by mouth every 6 hours until finished

## 2015-06-25 ENCOUNTER — Other Ambulatory Visit: Payer: Self-pay | Admitting: Cardiology

## 2015-06-26 ENCOUNTER — Telehealth: Payer: Self-pay | Admitting: Pulmonary Disease

## 2015-06-26 NOTE — Telephone Encounter (Signed)
Pt has an appt with SN on 1.5.16 for arm pain.   SN reviewed pt's chart and per SN- Pt should be referred to ortho for the arm pain, if he still wants to be seen by SN it is fine but not needed if pt does not want to - can cancel SN appt and just place ortho referral if pt is willing.   LMTCB for pt to inform.

## 2015-06-26 NOTE — Telephone Encounter (Signed)
Willie Lynch is irritated because his message was all mixed up.  He says that he does not have ARM PAIN, he has a cut on his arm caused by a fall.  He said that he fell while carrying a box of ornaments on Saturday and cut his arm, he wants to have Dr. Lenna Gilford look at his arm to make sure it is not infected.   Willie Lynch wants to keep appointment with Dr. Lenna Gilford tomorrow. FYI to Dr. Lenna Gilford

## 2015-06-26 NOTE — Telephone Encounter (Signed)
[phone], pt cb

## 2015-06-26 NOTE — Telephone Encounter (Signed)
SN aware. Will keep appt. Nothing further needed at this time.

## 2015-06-27 ENCOUNTER — Encounter: Payer: Self-pay | Admitting: Pulmonary Disease

## 2015-06-27 ENCOUNTER — Ambulatory Visit (INDEPENDENT_AMBULATORY_CARE_PROVIDER_SITE_OTHER): Payer: Medicare Other | Admitting: Pulmonary Disease

## 2015-06-27 VITALS — BP 152/70 | HR 62 | Temp 97.8°F | Ht 70.0 in | Wt 195.0 lb

## 2015-06-27 DIAGNOSIS — S41102A Unspecified open wound of left upper arm, initial encounter: Secondary | ICD-10-CM | POA: Diagnosis not present

## 2015-06-27 DIAGNOSIS — S41112A Laceration without foreign body of left upper arm, initial encounter: Secondary | ICD-10-CM

## 2015-06-27 NOTE — Progress Notes (Signed)
Subjective:    Patient ID: Willie Lynch, male    DOB: 02/23/1934, 82 y.o.   MRN: [id number]  HPI 82 y/o WM whom I last saw in 2009 for a routine follow up... He has a hx CAD- NSTEMI 5/10 w/ PCI to LAD & RCA, & followed by DrMcLean on ASA, Plavix, Losartan; and Hyperchol on Cres40... ~  SEE PREV EPIC NOTES FOR OLDER DATA >>    CT Chest 08/15/04 showed norm heart size & config, mild aortic elongation/ no aneurysm, clear lungs x mild diffuse peribronch thickening- no effusion or pneumothorax, no adenopathy, abd- neg x simple cysts in both kidneys...   FullPFT 03/23/11 showed FVC=4.26 (100), FEV1=3.17 (114%), %1sec=74, mid-flows wnl at 99% predicted; no change post bronchodil; TLC=6.40 (99%), RV=2.13 (81%), RV/TLC=33;  DLCO=83% predicted...   ~  Nov 08, 2013:  34mo ROV & Raheen's wife Khi Costilla died last week in [facility], he is quite tearful but processing his loss quite appropriately; he notes incr RLS symptoms & we discussed incr in his Mirapex to 0.5mg - 1to2 tabs qhs as needed, and he requests something for his nerves- try Xanax0.5,g 1/2-1 tab Tid prn... We reviewed the following medical problems during today's office visit >>     AR> on Zyrtek; he has mild seasonal symptoms... Note: PFTs & CXR were wnl...    HBP> on [id number] & back on Bystolic5-1/2tab/d; Hx of ACE cough in past; BP= 116/66 & he denies CP, palpit, SOB, edema...    CAD, s/pNSTEMI> on ASA81 & Plavix75; he had f/u DrMcLean 1/15> Hx NSTEMI w/ PCI to LAD & RCA in 2010; Myoview 6/12 was neg for ischemia & 2DEcho showed EF=45-50% (stable, no change); repeat cath 8/12 showed patent stents and nonobstructive dis...    Hyperlipid> on Cres40 + FishOil; last FLP 1/15 showed TChol 136, TG 180, HDL 31, LDL 69    GERD> on Pepcid20Bid; he denies abd pain, n/v, c/d, blood seen...    Divertics, IBS> followed by DrJacobs and he has never had a polyp but +FamHx colon ca in his mother; f/u colon due 2015...    Hx BPH, elev PSA, ED> on  Uroxatrol10, Proscar5, Viagra100; followed by DrEskridge; seen 10/13 and stable w/ PSA reported normal per pt...    DJD, LBP> on OTC analgesics as needed; he is followed by [facility] [place] w/ shots in his back periodically which really help per pt...    RLS> on Mirapex0.25 taking 1-2 Qhs but notes incr symptoms and we will double to Mirapex0.5mg - 1-2Qhs for symptoms...    Anxiety & Insomnia> on Benedryl25 prn; he's been axious since the passing of his wife 5/15- add Xanax 0.5mg  1/2 to 1 tab tid prn... We reviewed prob list, meds, xrays and labs> see below for updates >>   EKG 1/15 showed NSR, rate71, wnl, NAD...   LABS 1/15:  FLP- ok on Cres40 but TG=180 & HDL=31, advised low fat diet & incr exercise; Chems- wnl;  CBC- wnl...  ~  June 13, 2015:  49mo Willie Lynch continues his regular f/u w/ [facility]- DrEskridge, they do his labs... He presents w/ 2 main concerns>  1) RLS- on Mirapex0.5-2Qhs which helps about 60% of the time, the other 40% he can't sleep; we discussed trying to increase the Mirapex0.5 to 3 tabs Qhs & try taking it about 1H prior to bedtime; he will let me know how this is working for him...  2) Breathing issue- "DrMcLean said I  have asthma" prescribed AlbutHFA for prn use & this helps a little he says; he is c/o SOB/ DOE eg w/ stairs and golfing but denies chest tightness/ wheezing/ etc; he has a mild AM cough, no sput, no hemoptysis; he does 47min exercise 5d/wk (bike & elliptical), plus golfing;  Spirometry today is wnl & he is rec to continue the AlbutHFA prn wheezing... We reviewed the following medical problems during today's office visit >>     AR> on Zyrtek; he has mild seasonal symptoms... Note: PFTs & CXR were wnl...    HBP> on [id number], Amlod5, Bystolic5-1/2tab/d; Hx of ACE cough in past; BP= 128/70 & he denies CP, palpit, dizzy, edema; he has DOE that is surely multifactorial in nature & not progressive...    CAD, s/pNSTEMI> on ASA81 &  Plavix75; he had f/u DrMcLean 8/16 (note reviewed)> Hx NSTEMI w/ PCI to LAD & RCA in 2010; Myoview 6/12 was neg for ischemia & 2DEcho showed EF=45-50% (stable, no change); repeat cath 8/12 showed patent stents and nonobstructive dis; his dyspnea was sl better after switching Coreg to Bystolic; he remains active & the DOE seems multifactorial...    Hyperlipid> on Cres40 + FishOil; last FLP 1/16 showed TChol 125, TG 83, HDL 49, LDL 60 => much improved continue same med + diet/ exercise...    GERD> on Pepcid20Bid; he denies abd pain, n/v, c/d, blood seen... DrJacobs sent him a letter to f/u w/ him to discuss f/u colon...    Divertics, IBS> followed by DrJacobs and he has never had a polyp but +FamHx colon ca in his mother; last colonoscopy 1/05 (DrLeB) showed mod severe divertics, no polyp; he had f/u w/ DrJacobs 1/15 & they agreed to suspend f/u colons.    Hx BPH, elev PSA, ED> on Uroxatrol10, Proscar5, Viagra100; followed by DrEskridge; seen 11/15 and stable w/ PSA reported normal per pt...    DJD, LBP> on OTC analgesics as needed; he is followed by [facility] [place] w/ shots in his back periodically which really help per pt...    RLS> on Mirapex0.25 taking 1-2 Qhs but notes incr symptoms and we discussed incr Mirapex0.25 to 3tabs taken about 1H prior to bedtime & he will let me know how well this is working for him...    Anxiety & Insomnia> on Benedryl25 prn; he's been axious since the passing of his wife 5/15- add Xanax 0.5mg  1/2 to 1 tab tid prn... EXAM shows Afeb, VSS, O2sat=97% on RA at rest;  HEENT- neg, mallamapti1;  Chest- clear w/o w/r/r;  Heart- RR w/o m/r/g;  Abd- soft, nontender;  Ext- neg w/o c/c/e;  Neuro- intact...  Last CXR was 12/31/14 showing norm heart size, clear lungs/ NAD, mild DJD in Tspine...   Last 2DEcho 01/31/15 showed mild LVH, norm LVF w/ EF=50-55%, mild infer HK, Gr1DD, AoV sclerosis, mild AI, triv MR & calcif annulus, no real change from 2012...   Spirometry  06/13/15 showed FVC=4.08 (94%), FEV1=3.26 (100%), %1sec=80, mid-flows are wnl at 120% predicted=> the numbers are normal, the tracing showed some difficulty w/ the testing procedure.  LABS in Epic 2016 reviewed>  FLP- at goals,  Chems- wnl,  BNP=63,  CBC- wnl..dermatitis-dimer- neg...  IMP/PLAN>>  Willie Lynch's dyspnea appears multifactorial and non-progressive, he is able to exercise 5d/wk & play golf w/o problems; OK to use the AlbutHFA prn for wheezing & continue f/u w/ [facility]/ DrMcLean;  His RLS may respond to an incr in his Mirapex0.25- 3Qhs & takeit about 1H prior  to sleep;  We reviewed his numerous medcal issues as above... ROV 11yr, sooner if needed prn...   ~  June 27, 2015:  2wk ROV & add-on appt requested for fall w/ skin tear>  Willie Lynch was just here 2wks ago for a 70mo ROV (see above); he fell down some steps at home (tripped- did not hit head) w/ skin tear on left forearm; he did not go to ER- friend cleaned & dressed it daily using peroxide & neosporin, non-stick pad & paper tape;  Exam shows an L-shaped skin tear but it all looks clean, no drainage, no erythema, no purulence, and the skin appears to have attached itself nicely w/ the current dresssing regimen... We inspected the wound, cleaned it again & dressed it w/ Telfa, gauze roll, & paper tape; pt givn instructions for home care & asked to call for any problems or questions...          Problem List:   GLAUCOMA>  On eye drops from [place]...  ALLERGIC RHINITIS (ICD-477.9) - on ZYRTEK 10mg  prn, Benedryl prn... ~  CT Chest 08/15/04 showed norm heart size & config, mild aortic elongation/ no aneurysm, clear lungs x mild diffuse peribronch thickening- no effusion or pneumothorax, no adenopathy, abd- neg x simple cysts in both kidneys...  ~  CXR 8/12 showed borderline heart size, ectasia & calcif thorAo; clear lungs, mild degen spondylosis, prev right shoulder surg... ~  FullPFT 03/23/11 showed FVC=4.26 (100), FEV1=3.17 (114%), %1sec=74,  mid-flows wnl at 99% predicted; no change post bronchodil; TLC=6.40 (99%), RV=2.13 (81%), RV/TLC=33;  DLCO=83% predicted...  ~  Last CXR was 12/31/14 showing norm heart size, clear lungs/ NAD, mild DJD in Tspine...  ~  Spirometry 06/13/15 showed FVC=4.08 (94%), FEV1=3.26 (100%), %1sec=80, mid-flows are wnl at 120% predicted=> the numbers are normal, the tracing showed some difficulty w/ the testing procedure.  Hx HBP w/ ACE cough>  On LOSARTAN 50mg /d & COREG 6.25mg  Bid...  BP= 120/68 & he denies CP or palpit, but as noted is c/o no energy/ feeling poorly/ "indigestion" etc. ~  CXR 5/10 showed tort thor Ao, clear lungs, post op changes right shoulder ~  2DEcho 6/12 showed norm wall thickness, reduced EF=45-50% w/ HK basal, inferior, lateral walls; Gr1DD, mild AI.Marland Kitchen. ~  5/15:  on [id number] & back on Bystolic5-1/2tab/d; Hx of ACE cough in past; BP= 116/66 & he denies CP, palpit, SOB, edema.. ~  Last 2DEcho 01/31/15 showed mild LVH, norm LVF w/ EF=50-55%, mild infer HK, Gr1DD, AoV sclerosis, mild AI, triv MR & calcif annulus, no real change from 2012...   CAD/ NSTEMI 5/10 w/ PCI LAD & RCA>  Followed regularly by DrMcLean for Cards on ASA 81mg /d, PLAVIX 75mg /d, plus the Coreg, Losartan, Crestor, NTG prn. Ischemic Cardiomyopathy w/ diffuse HK on Echo 8/10 & EF= 45-50%, mild AI/ MR... ~  Hosp 5/10 by DrMcLean/ Cards> CAD & NSTEMI w/ Rotational atherectomy/PCI with Xience DES x 3 to RCA and rotational atherectomy/PCI with Xience DES to proximal LAD.  ~  EKG 6/12 showed SBrady, rate 58/min, otherw WNL... ~  1/14: Cards f/u by DrMcLean> hx NSTEMI 5/10 w/ PCI to LAD & RCA; c/o exertional dyspnea> ETT-Myoview 6/12 was neg w/o ischemia or infarction; 2DEcho 6/12 w/ EF=45-50% (no ch from prev); repeat cath 8/12 showed nonobstructive dis w/ patent stents; Dyspnea ultimately believed due to BBlockers as he had difficulty w/ Coreg & Bystolic- better off these meds... EKG showed SBrady, rate56, wnl, NAD.Marland Kitchen. ~  5/15: on  [place].Stagger & Plavix75;  he had f/u DrMcLean 1/15> Hx NSTEMI w/ PCI to LAD & RCA in 2010; Myoview 6/12 was neg for ischemia & 2DEcho showed EF=45-50% (stable, no change); repeat cath 8/12 showed patent stents and nonobstructive dis. ~   8/16: f/u DrMcLean (note reviewed)> Hx NSTEMI w/ PCI to LAD & RCA in 2010; Myoview 6/12 was neg for ischemia & 2DEcho showed EF=45-50% (stable, no change); repeat cath 8/12 showed patent stents and nonobstructive dis; his dyspnea was sl better after switching Coreg to Bystolic; he remains active & the DOE seems multifactorial.  HYPERLIPIDEMIA>  On diet + CRESTOR 40mg /d and FISH OIL 1000mg  Bid... ~  FLP 10/11 on Cres40 showed TChol 112, TG 100, HDL 34, LDL 58 ~  FLP 7/12 on Cres40 showed TChol 123, TG 68, HDL 46, LDL 64 ~  FLP 8/13 on Cres40 showed TChol 108, TG 104, HDL 42, LDL 45  ~  FLP 1/15 on Cres40 showed TChol 136, TG 180, HDL 31, LDL 69  ~  FLP 1/16 on Cres40 showed TChol 125, TG 83, HDL 49, LDL 60  GERD/ Esophagitis>  On PEPCID which he takes Bid & not on PPI due to his Plavix rx... ~  Labs 6/12 showed Hg= 14.7, MCV= 93... ~  CBC has remained wnl...   DIVERTICULOSIS OF COLON (ICD-562.10) - last colon 11/05 by DrSamLeB w/ divertics only... He saw DrJacobs 10/11 & they decided on f/u colon in 2015 despite family hx of colon cancer in mother since he has never had a polyp etc... Hx of IRRITABLE BOWEL SYNDROME (ICD-564.1) - he denies recent abd pain, n/v, change in bowels, etc... ~  1/15: he saw DrJacobs> divertics, neg colon 2005, on Plavix- they decided to forgo any further colon cancer screening...   Hx of ELEVATED PROSTATE SPECIFIC ANTIGEN (ICD-790.93) - followed by DrPeterson for Urology on UROXATRAL 10mg /d, & PROSCAR 5mg /d;  He tells me that DrPeterson follows his PSAs, we do not have any recent notes from Urology. Hx of RENAL CYST (ICD-593.2) ~  10/13: he had f/u DrEskridge> BPH w/ BOO, elev PSA, ED; stable on [place]; he tells me his PSA was  "fine"...  OSTEOARTHRITIS (ICD-715.90) LOW BACK PAIN, CHRONIC (ICD-724.2) - severe back discomfort w/ eval by ortho (DrOlin & Ramos) resulting in epid steroid shots, a round of prednisone, and percocet + advil for pain...   Hx of MIGRAINE HEADACHE (ICD-346.90) - he denies recent migraine problem...  INSOMNIA>  On BENEDRYL 25mg  prn use...  HEALTH MAINTENANCE: ~  GI:  Dr Ardis Hughs follows & f/u colon due 2015... ~  GU:  Followed by DrPeterson & he does his PSAs... ~  Immunizations:  He received Shingles vaccine in 2011   Past Medical History  Diagnosis Date  . CAD (coronary artery disease)     NSTEMI 5/10. Rotational atherectomy/PCI w Xience DES x 3 to RCA and rotation atherectomy /PCI w Xience DES to prox LAD  . Abdominal pain, unspecified site   . Allergic rhinitis, cause unspecified   . Diverticulosis of colon (without mention of hemorrhage)     last colon 11/05 by DrSamLeB w divertics only  . Irritable bowel syndrome   . Elevated prostate specific antigen (PSA)     followed by Dr Terance Hart for urology  . Acquired cyst of kidney   . Osteoarthrosis, unspecified whether generalized or localized, unspecified site   . Lumbago   . Migraine, unspecified, without mention of intractable migraine without mention of status migrainosus   . Hypertension  ACEI cough  . GERD (gastroesophageal reflux disease)     esophagitis  . Hyperlipidemia   . Ischemic cardiomyopathy     mild echo (8/10) w EF 45-50%, diffuse hypokinesis, mild AI and mild MR    Past Surgical History  Procedure Laterality Date  . Rotator cuff surgery      Dr. Shellia Carwin  . Bilat inguinal hernia repairs  7/06    Dr Hassell Done  . Angioplasty       Outpatient Encounter Prescriptions as of 06/27/2015  Medication Sig  . albuterol (PROVENTIL HFA;VENTOLIN HFA) 108 (90 BASE) MCG/ACT inhaler Inhale 2 puffs into the lungs every 6 (six) hours as needed for wheezing or shortness of breath.  . alfuzosin (UROXATRAL) 10 MG 24 hr  tablet Take 10 mg by mouth daily.    Marland Kitchen ALPRAZolam (XANAX) 0.5 MG tablet Take 1/2 to 1 tablet by mouth three times daily as needed for nerves  . amLODipine (NORVASC) 5 MG tablet Take 1 tablet (5 mg total) by mouth daily.  Marland Kitchen aspirin (ASPIR-LOW) 81 MG EC tablet Take 81 mg by mouth daily. 2 tablets (total 162mg ) daily  . BYSTOLIC 5 MG tablet take 1/2 tablets by mouth once daily  . cetirizine (ZYRTEC ALLERGY) 10 MG tablet Take 10 mg by mouth as needed.    . clopidogrel (PLAVIX) 75 MG tablet take 1 tablet by mouth once daily  . diphenhydrAMINE (BENADRYL) 25 MG tablet Take 25 mg by mouth as needed. For itching    . famotidine (PEPCID) 20 MG tablet Take 20 mg by mouth 2 (two) times daily.   . finasteride (PROSCAR) 5 MG tablet Take 5 mg by mouth daily.    Marland Kitchen losartan (COZAAR) 50 MG tablet take 1 tablet by mouth twice a day  . Multiple Vitamins-Minerals (MULTIVITAL) tablet Take 1 tablet by mouth daily.    . nitroGLYCERIN (NITROSTAT) 0.4 MG SL tablet DISSOLVE 1 TABLET UNDER THE TONGUE IF NEEDED AS DIRECTED.  . Omega-3 Fatty Acids (FISH OIL) 1000 MG CAPS Take by mouth daily.   . pramipexole (MIRAPEX) 0.5 MG tablet take 1-2 tablet by mouth once daily at bedtime  . rosuvastatin (CRESTOR) 40 MG tablet take 1 tablet by mouth once daily  . Timolol Maleate 0.5 % (DAILY) SOLN Apply to eye daily. Instill one droplet in OU  . travoprost, benzalkonium, (TRAVATAN) 0.004 % ophthalmic solution Place 1 drop into both eyes at bedtime.    Marland Kitchen VIAGRA 100 MG tablet take 1 tablet by mouth once daily if needed for ERECTILE DYSFUNCTION   No facility-administered encounter medications on file as of 06/27/2015.    Allergies  Allergen Reactions  . Amoxicillin-Pot Clavulanate     REACTION: pt developed HIVES on augmentin  . Codeine   . Morphine    ACE inhibitors:  Lisinopril w/ cough...     Current Medications, Allergies, Past Medical History, Past Surgical History, Family History, and Social History were reviewed in  [employer] record.    Review of Systems       See HPI - all other systems neg except as noted...      The patient denies anorexia, fever, weight loss, weight gain, vision loss, decreased hearing, hoarseness, chest pain, syncope, peripheral edema, prolonged cough, hemoptysis, abdominal pain, melena, hematochezia, severe indigestion/heartburn, hematuria, incontinence, muscle weakness, suspicious skin lesions, transient blindness, difficulty walking, depression, unusual weight change, abnormal bleeding, enlarged lymph nodes, and angioedema.     Objective:   Physical Exam    WD, WN,  82 y/o WM in NAD...  GENERAL:  Alert & oriented; pleasant & cooperative. HEENT:  [place]/AT, EOM-wnl, PERRLA, EACs-clear, TMs-wnl, NOSE-clear, THROAT-clear & wnl. NECK:  Supple w/ full ROM; no JVD; normal carotid impulses w/o bruits; no thyromegaly or nodules palpated; no lymphadenopathy. CHEST:  Clear to P & A; without wheezes/ rales/ or rhonchi. HEART:  Regular Rhythm; without murmurs/ rubs/ or gallops. ABDOMEN:  Soft & nontender; normal bowel sounds; no organomegaly or masses detected. EXT: without deformities, mild arthritic changes; no varicose veins/ venous insuffic/ or edema. NEURO:  CN's intact; motor testing normal; sensory testing normal; gait normal & balance OK. DERM:  No lesions noted; no rash etc...  RADIOLOGY DATA:  Reviewed in the EPIC EMR & discussed w/ the patient...  LABORATORY DATA:  Reviewed in the EPIC EMR & discussed w/ the patient...   Assessment & Plan:    RLS>  We discussed increasing the MIRAPEX0.25 to 3 tabs taken 1H prior to bedtime...   CAD/ s/p NSTEMI 5/10 w/ PTCA & stents/ Ischemic Cardiomyopathy>  Followed by drMcLean 7 stable...  CHOL>  Stable on the Cres40...  GI Symptoms/ GERD/ IBS/ Gas>  Followed by DrJacobs, on Pepcid Bid & add Mylicon, other Simethacone containing gas therapies...  GU>  Hx elev PSA>  All followed by DrEskridge...  LBP/ DJD>   Followed by DrOlin & Ramos...  Anxiety>  On Xanax0.5mg  prn use...   Patient's Medications  New Prescriptions   No medications on file  Previous Medications   ALBUTEROL (PROVENTIL HFA;VENTOLIN HFA) 108 (90 BASE) MCG/ACT INHALER    Inhale 2 puffs into the lungs every 6 (six) hours as needed for wheezing or shortness of breath.   ALFUZOSIN (UROXATRAL) 10 MG 24 HR TABLET    Take 10 mg by mouth daily.     ALPRAZOLAM (XANAX) 0.5 MG TABLET    Take 1/2 to 1 tablet by mouth three times daily as needed for nerves   AMLODIPINE (NORVASC) 5 MG TABLET    Take 1 tablet (5 mg total) by mouth daily.   ASPIRIN (ASPIR-LOW) 81 MG EC TABLET    Take 81 mg by mouth daily. 2 tablets (total 162mg ) daily   BYSTOLIC 5 MG TABLET    take 1/2 tablets by mouth once daily   CETIRIZINE (ZYRTEC ALLERGY) 10 MG TABLET    Take 10 mg by mouth as needed.     CLOPIDOGREL (PLAVIX) 75 MG TABLET    take 1 tablet by mouth once daily   DIPHENHYDRAMINE (BENADRYL) 25 MG TABLET    Take 25 mg by mouth as needed. For itching     FAMOTIDINE (PEPCID) 20 MG TABLET    Take 20 mg by mouth 2 (two) times daily.    FINASTERIDE (PROSCAR) 5 MG TABLET    Take 5 mg by mouth daily.     LOSARTAN (COZAAR) 50 MG TABLET    take 1 tablet by mouth twice a day   MULTIPLE VITAMINS-MINERALS (MULTIVITAL) TABLET    Take 1 tablet by mouth daily.     NITROGLYCERIN (NITROSTAT) 0.4 MG SL TABLET    DISSOLVE 1 TABLET UNDER THE TONGUE IF NEEDED AS DIRECTED.   OMEGA-3 FATTY ACIDS (FISH OIL) 1000 MG CAPS    Take by mouth daily.    PRAMIPEXOLE (MIRAPEX) 0.5 MG TABLET    take 3 tablets by mouth once daily at bedtime   ROSUVASTATIN (CRESTOR) 40 MG TABLET    take 1 tablet by mouth once daily   TIMOLOL MALEATE 0.5 % (  DAILY) SOLN    Apply to eye daily. Instill one droplet in OU   TRAVOPROST, BENZALKONIUM, (TRAVATAN) 0.004 % OPHTHALMIC SOLUTION    Place 1 drop into both eyes at bedtime.     VIAGRA 100 MG TABLET    take 1 tablet by mouth once daily if needed for ERECTILE  DYSFUNCTION  Modified Medications   No medications on file  Discontinued Medications   CLINDAMYCIN (CLEOCIN) 150 MG CAPSULE    Take 2 capsules immediately then take 1 capsule by mouth every 6 hours until finished

## 2015-06-27 NOTE — Patient Instructions (Signed)
Your left arm skin tear looks clean 7 starting to heal...  Continue daily cleansing w/ mild soapy water...    Pat dry...    Apply neosporin or an antiseptic spray...    Cover w/ a nonstick TELFA pad and light gauze dressing w/ paper tape...  Call for any purulence, drainage, pain, redness, etc..Marland Kitchen

## 2015-07-04 DIAGNOSIS — L570 Actinic keratosis: Secondary | ICD-10-CM | POA: Diagnosis not present

## 2015-07-09 ENCOUNTER — Telehealth: Payer: Self-pay | Admitting: Pulmonary Disease

## 2015-07-09 NOTE — Telephone Encounter (Signed)
lmtcb x1 for pt. 

## 2015-07-10 NOTE — Telephone Encounter (Signed)
Spoke with the pt  He states Mirapex 0.5 mg 2 tabs at hs helps some, but sometimes has to take 3 tabs to get relief  He states needing new rx for # 90 tabs take 2-3 at bedtime  Please advise if this is okay, thanks!

## 2015-07-11 MED ORDER — PRAMIPEXOLE DIHYDROCHLORIDE 0.5 MG PO TABS: ORAL_TABLET | ORAL | Status: DC

## 2015-07-11 NOTE — Telephone Encounter (Signed)
Per SN: okay to refill #90 x 3 refills

## 2015-07-11 NOTE — Telephone Encounter (Signed)
Pt aware of medication change. Mirapex Rx sent to [facility]. Nothing further needed.

## 2015-07-16 ENCOUNTER — Other Ambulatory Visit: Payer: Self-pay | Admitting: Adult Health

## 2015-07-17 DIAGNOSIS — R3129 Other microscopic hematuria: Secondary | ICD-10-CM | POA: Diagnosis not present

## 2015-07-17 DIAGNOSIS — Z0000 Encounter for general adult medical examination without abnormal findings: Secondary | ICD-10-CM | POA: Diagnosis not present

## 2015-07-17 DIAGNOSIS — N401 Enlarged prostate with lower urinary tract symptoms: Secondary | ICD-10-CM | POA: Diagnosis not present

## 2015-07-17 DIAGNOSIS — N138 Other obstructive and reflux uropathy: Secondary | ICD-10-CM | POA: Diagnosis not present

## 2015-07-17 DIAGNOSIS — R972 Elevated prostate specific antigen [PSA]: Secondary | ICD-10-CM | POA: Diagnosis not present

## 2015-07-22 ENCOUNTER — Other Ambulatory Visit: Payer: Self-pay | Admitting: Cardiology

## 2015-07-23 DIAGNOSIS — R3129 Other microscopic hematuria: Secondary | ICD-10-CM | POA: Diagnosis not present

## 2015-07-29 ENCOUNTER — Telehealth: Payer: Self-pay | Admitting: Cardiology

## 2015-07-29 NOTE — Telephone Encounter (Signed)
OK to donate blood.

## 2015-07-29 NOTE — Telephone Encounter (Signed)
Pt asking if okay with Dr Aundra Dubin for him to donate blood, pt states he has donated blood prior to MI without problems.   Pt advised I will forward to Dr Aundra Dubin for review.

## 2015-07-29 NOTE — Telephone Encounter (Signed)
Pt.notified

## 2015-07-29 NOTE — Telephone Encounter (Signed)
New message      Can pt give blood ---being on cardiac medication?

## 2015-09-02 ENCOUNTER — Telehealth: Payer: Self-pay | Admitting: Cardiology

## 2015-09-02 NOTE — Telephone Encounter (Signed)
New message ° ° ° ° ° °Talk to the nurse.  Pt would not tell me what he wanted °

## 2015-09-02 NOTE — Telephone Encounter (Signed)
Spoke with patient who states he needs to go out of town tomorrow and would like to change his appointment.  He states he can wait a few weeks but does not want until June to be seen which is what was offered to him by the scheduler.  I changed his appointment to 3/31 and he verbalized understanding and agreement.  He thanked me for the help.

## 2015-09-03 ENCOUNTER — Ambulatory Visit: Payer: Medicare Other | Admitting: Cardiology

## 2015-09-17 DIAGNOSIS — H35373 Puckering of macula, bilateral: Secondary | ICD-10-CM | POA: Diagnosis not present

## 2015-09-17 DIAGNOSIS — H401123 Primary open-angle glaucoma, left eye, severe stage: Secondary | ICD-10-CM | POA: Diagnosis not present

## 2015-09-17 DIAGNOSIS — H52203 Unspecified astigmatism, bilateral: Secondary | ICD-10-CM | POA: Diagnosis not present

## 2015-09-20 ENCOUNTER — Encounter: Payer: Self-pay | Admitting: Cardiology

## 2015-09-20 ENCOUNTER — Ambulatory Visit (INDEPENDENT_AMBULATORY_CARE_PROVIDER_SITE_OTHER): Payer: Medicare Other | Admitting: Cardiology

## 2015-09-20 VITALS — BP 142/80 | HR 58 | Ht 70.0 in | Wt 199.2 lb

## 2015-09-20 DIAGNOSIS — I2510 Atherosclerotic heart disease of native coronary artery without angina pectoris: Secondary | ICD-10-CM | POA: Diagnosis not present

## 2015-09-20 DIAGNOSIS — R0600 Dyspnea, unspecified: Secondary | ICD-10-CM

## 2015-09-20 DIAGNOSIS — I10 Essential (primary) hypertension: Secondary | ICD-10-CM

## 2015-09-20 NOTE — Patient Instructions (Addendum)
Medication Instructions:  Stop Bystolic  Labwork: Your physician recommends that you return for a FASTING lipid profile/liver profile next week.  This is scheduled for Tuesday April 4,2017. Do not eat or drink after midnight the night before.  The lab is open from 7:30AM-5PM.   Testing/Procedures: Your physician has recommended that you have a cardiopulmonary stress test (CPX). CPX testing is a non-invasive measurement of heart and lung function. It replaces a traditional treadmill stress test. This type of test provides a tremendous amount of information that relates not only to your present condition but also for future outcomes. This test combines measurements of you ventilation, respiratory gas exchange in the lungs, electrocardiogram (EKG), blood pressure and physical response before, during, and following an exercise protocol.     Follow-Up: Your physician recommends that you schedule a follow-up appointment in: 3 months with Dr Aundra Dubin.   Any Other Special Instructions Will Be Listed Below (If Applicable).  Your physician has requested that you regularly monitor and record your blood pressure readings at home. Please use the same machine at the same time of day to check your readings and record them. I will call you next week to get the readings.     If you need a refill on your cardiac medications before your next appointment, please call your pharmacy.

## 2015-09-23 ENCOUNTER — Emergency Department (HOSPITAL_COMMUNITY): Payer: Medicare Other

## 2015-09-23 ENCOUNTER — Observation Stay (HOSPITAL_COMMUNITY)
Admission: EM | Admit: 2015-09-23 | Discharge: 2015-09-24 | Disposition: A | Discharge status: Home / Self Care | Payer: Medicare Other | Attending: Cardiology | Admitting: Cardiology

## 2015-09-23 ENCOUNTER — Encounter (HOSPITAL_COMMUNITY): Payer: Self-pay | Admitting: Emergency Medicine

## 2015-09-23 DIAGNOSIS — I2510 Atherosclerotic heart disease of native coronary artery without angina pectoris: Secondary | ICD-10-CM | POA: Diagnosis not present

## 2015-09-23 DIAGNOSIS — R079 Chest pain, unspecified: Principal | ICD-10-CM | POA: Insufficient documentation

## 2015-09-23 DIAGNOSIS — I252 Old myocardial infarction: Secondary | ICD-10-CM | POA: Diagnosis not present

## 2015-09-23 DIAGNOSIS — Z87891 Personal history of nicotine dependence: Secondary | ICD-10-CM | POA: Diagnosis not present

## 2015-09-23 DIAGNOSIS — E7800 Pure hypercholesterolemia, unspecified: Secondary | ICD-10-CM | POA: Diagnosis not present

## 2015-09-23 DIAGNOSIS — Z955 Presence of coronary angioplasty implant and graft: Secondary | ICD-10-CM | POA: Insufficient documentation

## 2015-09-23 DIAGNOSIS — I208 Other forms of angina pectoris: Secondary | ICD-10-CM | POA: Insufficient documentation

## 2015-09-23 DIAGNOSIS — D696 Thrombocytopenia, unspecified: Secondary | ICD-10-CM | POA: Insufficient documentation

## 2015-09-23 DIAGNOSIS — Z7982 Long term (current) use of aspirin: Secondary | ICD-10-CM | POA: Insufficient documentation

## 2015-09-23 DIAGNOSIS — R0789 Other chest pain: Secondary | ICD-10-CM | POA: Diagnosis not present

## 2015-09-23 DIAGNOSIS — Z880 Allergy status to penicillin: Secondary | ICD-10-CM | POA: Diagnosis not present

## 2015-09-23 DIAGNOSIS — E785 Hyperlipidemia, unspecified: Secondary | ICD-10-CM | POA: Diagnosis not present

## 2015-09-23 DIAGNOSIS — Z885 Allergy status to narcotic agent status: Secondary | ICD-10-CM | POA: Diagnosis not present

## 2015-09-23 DIAGNOSIS — R0600 Dyspnea, unspecified: Secondary | ICD-10-CM

## 2015-09-23 DIAGNOSIS — I119 Hypertensive heart disease without heart failure: Secondary | ICD-10-CM | POA: Diagnosis not present

## 2015-09-23 DIAGNOSIS — I10 Essential (primary) hypertension: Secondary | ICD-10-CM | POA: Insufficient documentation

## 2015-09-23 LAB — CBC WITH DIFFERENTIAL/PLATELET
Basophils Absolute: 0 10*3/uL (ref 0.0–0.1)
Basophils Relative: 0 %
Eosinophils Absolute: 0.1 10*3/uL (ref 0.0–0.7)
Eosinophils Relative: 1 %
HCT: 38.3 % — ABNORMAL LOW (ref 39.0–52.0)
HEMOGLOBIN: 13.4 g/dL (ref 13.0–17.0)
LYMPHS ABS: 1.1 10*3/uL (ref 0.7–4.0)
LYMPHS PCT: 23 %
MCH: 31.2 pg (ref 26.0–34.0)
MCHC: 35 g/dL (ref 30.0–36.0)
MCV: 89.3 fL (ref 78.0–100.0)
Monocytes Absolute: 0.5 10*3/uL (ref 0.1–1.0)
Monocytes Relative: 10 %
NEUTROS PCT: 66 %
Neutro Abs: 3.2 10*3/uL (ref 1.7–7.7)
Platelets: 83 10*3/uL — ABNORMAL LOW (ref 150–400)
RBC: 4.29 MIL/uL (ref 4.22–5.81)
RDW: 13.8 % (ref 11.5–15.5)
WBC: 4.9 10*3/uL (ref 4.0–10.5)

## 2015-09-23 LAB — COMPREHENSIVE METABOLIC PANEL
ALK PHOS: 48 U/L (ref 38–126)
ALT: 27 U/L (ref 17–63)
AST: 26 U/L (ref 15–41)
Albumin: 3.7 g/dL (ref 3.5–5.0)
Anion gap: 11 (ref 5–15)
BUN: 16 mg/dL (ref 6–20)
CALCIUM: 9.1 mg/dL (ref 8.9–10.3)
CO2: 20 mmol/L — AB (ref 22–32)
CREATININE: 0.71 mg/dL (ref 0.61–1.24)
Chloride: 107 mmol/L (ref 101–111)
GFR calc non Af Amer: >60 mL/min (ref >60)
Glucose, Bld: 115 mg/dL — ABNORMAL HIGH (ref 65–99)
Potassium: 3.9 mmol/L (ref 3.5–5.1)
SODIUM: 138 mmol/L (ref 135–145)
Total Bilirubin: 1.5 mg/dL — ABNORMAL HIGH (ref 0.3–1.2)
Total Protein: 5.9 g/dL — ABNORMAL LOW (ref 6.5–8.1)

## 2015-09-23 LAB — I-STAT TROPONIN, ED: Troponin i, poc: 0 ng/mL (ref 0.00–0.08)

## 2015-09-23 LAB — BRAIN NATRIURETIC PEPTIDE: B Natriuretic Peptide: 21.9 pg/mL (ref 0.0–100.0)

## 2015-09-23 LAB — T4, FREE: Free T4: 0.79 ng/dL (ref 0.61–1.12)

## 2015-09-23 LAB — TSH: TSH: 3.334 u[IU]/mL (ref 0.350–4.500)

## 2015-09-23 LAB — MAGNESIUM: MAGNESIUM: 2.2 mg/dL (ref 1.7–2.4)

## 2015-09-23 LAB — TROPONIN I
Troponin I: <0.03 ng/mL (ref <0.031)
Troponin I: <0.03 ng/mL (ref <0.031)

## 2015-09-23 MED ORDER — ASPIRIN 81 MG PO TBEC: 162.0000 mg | DELAYED_RELEASE_TABLET | Freq: Every day | ORAL | Status: DC

## 2015-09-23 MED ORDER — NITROGLYCERIN 2 % TD OINT
1.0000 [in_us] | TOPICAL_OINTMENT | Freq: Four times a day (QID) | TRANSDERMAL | Status: DC
  Administered 2015-09-23 – 2015-09-24 (×3): 1 [in_us] via TOPICAL
  Filled 2015-09-23: qty 1

## 2015-09-23 MED ORDER — SODIUM CHLORIDE 0.9 % IV SOLN: INTRAVENOUS | Status: DC

## 2015-09-23 MED ORDER — LATANOPROST 0.005 % OP SOLN
1.0000 [drp] | Freq: Every day | OPHTHALMIC | Status: DC
  Administered 2015-09-23: 1 [drp] via OPHTHALMIC
  Filled 2015-09-23: qty 2.5

## 2015-09-23 MED ORDER — ADULT MULTIVITAMIN W/MINERALS CH
1.0000 | ORAL_TABLET | Freq: Every day | ORAL | Status: DC
  Administered 2015-09-23 – 2015-09-24 (×2): 1 via ORAL
  Filled 2015-09-23 (×2): qty 1

## 2015-09-23 MED ORDER — OMEGA-3-ACID ETHYL ESTERS 1 G PO CAPS
1.0000 g | ORAL_CAPSULE | Freq: Every day | ORAL | Status: DC
  Administered 2015-09-23 – 2015-09-24 (×2): 1 g via ORAL
  Filled 2015-09-23 (×2): qty 1

## 2015-09-23 MED ORDER — LORATADINE 10 MG PO TABS
10.0000 mg | ORAL_TABLET | Freq: Every day | ORAL | Status: DC
  Administered 2015-09-23 – 2015-09-24 (×2): 10 mg via ORAL
  Filled 2015-09-23 (×2): qty 1

## 2015-09-23 MED ORDER — OMEGA-3-ACID ETHYL ESTERS 1 G PO CAPS: 1.0000 g | ORAL_CAPSULE | Freq: Every day | ORAL | Status: DC

## 2015-09-23 MED ORDER — NITROGLYCERIN 0.4 MG SL SUBL: 0.4000 mg | SUBLINGUAL_TABLET | SUBLINGUAL | Status: DC | PRN

## 2015-09-23 MED ORDER — CLOPIDOGREL BISULFATE 75 MG PO TABS
75.0000 mg | ORAL_TABLET | Freq: Every day | ORAL | Status: DC
  Administered 2015-09-23 – 2015-09-24 (×2): 75 mg via ORAL
  Filled 2015-09-23 (×2): qty 1

## 2015-09-23 MED ORDER — ALBUTEROL SULFATE (2.5 MG/3ML) 0.083% IN NEBU: 2.5000 mg | INHALATION_SOLUTION | Freq: Four times a day (QID) | RESPIRATORY_TRACT | Status: DC | PRN

## 2015-09-23 MED ORDER — ALFUZOSIN HCL ER 10 MG PO TB24
10.0000 mg | ORAL_TABLET | Freq: Every day | ORAL | Status: DC
  Administered 2015-09-23 – 2015-09-24 (×2): 10 mg via ORAL
  Filled 2015-09-23 (×3): qty 1

## 2015-09-23 MED ORDER — ACETAMINOPHEN 325 MG PO TABS
650.0000 mg | ORAL_TABLET | ORAL | Status: DC | PRN
  Administered 2015-09-24: 650 mg via ORAL
  Filled 2015-09-23: qty 2

## 2015-09-23 MED ORDER — FINASTERIDE 5 MG PO TABS
5.0000 mg | ORAL_TABLET | Freq: Every day | ORAL | Status: DC
  Administered 2015-09-23 – 2015-09-24 (×2): 5 mg via ORAL
  Filled 2015-09-23 (×2): qty 1

## 2015-09-23 MED ORDER — PRAMIPEXOLE DIHYDROCHLORIDE 0.25 MG PO TABS
0.5000 mg | ORAL_TABLET | Freq: Every day | ORAL | Status: DC
  Administered 2015-09-23: 0.5 mg via ORAL
  Filled 2015-09-23: qty 2

## 2015-09-23 MED ORDER — NEBIVOLOL HCL 5 MG PO TABS
2.5000 mg | ORAL_TABLET | Freq: Every day | ORAL | Status: DC
  Administered 2015-09-23 – 2015-09-24 (×2): 2.5 mg via ORAL
  Filled 2015-09-23 (×2): qty 1

## 2015-09-23 MED ORDER — ALPRAZOLAM 0.5 MG PO TABS: 0.5000 mg | ORAL_TABLET | Freq: Two times a day (BID) | ORAL | Status: DC | PRN

## 2015-09-23 MED ORDER — ROSUVASTATIN CALCIUM 10 MG PO TABS
40.0000 mg | ORAL_TABLET | Freq: Every day | ORAL | Status: DC
  Administered 2015-09-23 – 2015-09-24 (×2): 40 mg via ORAL
  Filled 2015-09-23 (×2): qty 4

## 2015-09-23 MED ORDER — TIMOLOL MALEATE 0.5 % OP SOLG
1.0000 [drp] | Freq: Every morning | OPHTHALMIC | Status: DC
  Administered 2015-09-24: 1 [drp] via OPHTHALMIC
  Filled 2015-09-23: qty 5

## 2015-09-23 MED ORDER — LOSARTAN POTASSIUM 50 MG PO TABS
50.0000 mg | ORAL_TABLET | Freq: Two times a day (BID) | ORAL | Status: DC
  Administered 2015-09-23 – 2015-09-24 (×2): 50 mg via ORAL
  Filled 2015-09-23 (×2): qty 1

## 2015-09-23 MED ORDER — AMLODIPINE BESYLATE 5 MG PO TABS
5.0000 mg | ORAL_TABLET | Freq: Every day | ORAL | Status: DC
  Administered 2015-09-23 – 2015-09-24 (×2): 5 mg via ORAL
  Filled 2015-09-23 (×2): qty 1

## 2015-09-23 MED ORDER — ONDANSETRON HCL 4 MG/2ML IJ SOLN: 4.0000 mg | Freq: Four times a day (QID) | INTRAMUSCULAR | Status: DC | PRN

## 2015-09-23 MED ORDER — HEPARIN SODIUM (PORCINE) 5000 UNIT/ML IJ SOLN
5000.0000 [IU] | Freq: Three times a day (TID) | INTRAMUSCULAR | Status: DC
  Administered 2015-09-23 – 2015-09-24 (×3): 5000 [IU] via SUBCUTANEOUS
  Filled 2015-09-23 (×3): qty 1

## 2015-09-23 MED ORDER — FAMOTIDINE 20 MG PO TABS
20.0000 mg | ORAL_TABLET | Freq: Two times a day (BID) | ORAL | Status: DC
  Administered 2015-09-23 – 2015-09-24 (×2): 20 mg via ORAL
  Filled 2015-09-23 (×2): qty 1

## 2015-09-23 MED ORDER — ASPIRIN EC 81 MG PO TBEC
81.0000 mg | DELAYED_RELEASE_TABLET | Freq: Every day | ORAL | Status: DC
  Administered 2015-09-24: 81 mg via ORAL
  Filled 2015-09-23: qty 1

## 2015-09-23 NOTE — ED Notes (Signed)
Pt went to X-Ray.  

## 2015-09-23 NOTE — ED Provider Notes (Signed)
CSN: [id number]     Arrival date & time 09/23/15  0902 History   First MD Initiated Contact with Patient 09/23/15 [phone]     Chief Complaint  Patient presents with  . Chest Pain  . Shortness of Breath     (Consider location/radiation/quality/duration/timing/severity/associated sxs/prior Treatment) HPI Willie Lynch is a 82 y.o. male with multiple medical problems including CAD, NSTEMI in 2010 with stenting, asthma, GERD, HTN, presents to emergency department complaining of chest pain. Patient states he woke up around 2 AM feeling pain and burning in his chest. He attributed it to acid reflux. He states he took Pepcid which helped his symptoms and he was able to go back to sleep. He reports this morning his symptoms return. This time he reports pressure in the left side of the chest. He denies associated shortness of breath, diaphoresis, nausea. He states over last several months he has had exertional dyspnea for which he has seen his pulmonologist and cardiologist. He was taken off Bystolic to see if that would help. He was told that if his symptoms continued he would need another stress test. Patient to call for baby aspirin at home prior to coming in, and was given 2 sublingual nitroglycerin by EMS which he states helped his pressure. Patient also noted that his blood pressure was elevated this morning at 190/90 which is high for him. He did not take any of his medications this morning.  Past Medical History  Diagnosis Date  . CAD (coronary artery disease)     NSTEMI 5/10. Rotational atherectomy/PCI w Xience DES x 3 to RCA and rotation atherectomy /PCI w Xience DES to prox LAD  . Abdominal pain, unspecified site   . Allergic rhinitis, cause unspecified   . Diverticulosis of colon (without mention of hemorrhage)     last colon 11/05 by DrSamLeB w divertics only  . Irritable bowel syndrome   . Elevated prostate specific antigen (PSA)     followed by Dr Terance Hart for urology  . Acquired cyst of  kidney   . Osteoarthrosis, unspecified whether generalized or localized, unspecified site   . Lumbago   . Migraine, unspecified, without mention of intractable migraine without mention of status migrainosus   . Hypertension     ACEI cough  . GERD (gastroesophageal reflux disease)     esophagitis  . Hyperlipidemia   . Ischemic cardiomyopathy     mild echo (8/10) w EF 45-50%, diffuse hypokinesis, mild AI and mild MR   Past Surgical History  Procedure Laterality Date  . Rotator cuff surgery      Dr. Shellia Carwin  . Bilat inguinal hernia repairs  7/06    Dr Hassell Done  . Angioplasty     Family History  Problem Relation Age of Onset  . Heart disease Father   . Cancer      ?? not sure what kind   Social History  Substance Use Topics  . Smoking status: Former Smoker    Quit date: 06/22/1961  . Smokeless tobacco: Never Used  . Alcohol Use: Yes     Comment: 4 drinks per week     Review of Systems  Constitutional: Negative for fever and chills.  Respiratory: Negative for cough, chest tightness and shortness of breath.   Cardiovascular: Positive for chest pain. Negative for palpitations and leg swelling.  Gastrointestinal: Negative for nausea, vomiting, abdominal pain, diarrhea and abdominal distention.  Genitourinary: Negative for dysuria, urgency, frequency and hematuria.  Musculoskeletal: Negative for myalgias, arthralgias, neck  pain and neck stiffness.  Skin: Negative for rash.  Allergic/Immunologic: Negative for immunocompromised state.  Neurological: Negative for dizziness, weakness, light-headedness, numbness and headaches.  All other systems reviewed and are negative.     Allergies  Amoxicillin-pot clavulanate; Codeine; and Morphine  Home Medications   Prior to Admission medications   Medication Sig Start Date End Date Taking? Authorizing Provider  albuterol (PROVENTIL HFA;VENTOLIN HFA) 108 (90 BASE) MCG/ACT inhaler Inhale 2 puffs into the lungs every 6 (six) hours as  needed for wheezing or shortness of breath. 01/23/15   Larey Dresser, MD  alfuzosin (UROXATRAL) 10 MG 24 hr tablet Take 10 mg by mouth daily.      Historical Provider, MD  ALPRAZolam Duanne Moron) 0.5 MG tablet Take 1/2 to 1 tablet by mouth three times daily as needed for nerves 11/08/13   Noralee Space, MD  amLODipine (NORVASC) 5 MG tablet Take 1 tablet (5 mg total) by mouth daily. 08/02/14   Larey Dresser, MD  aspirin (ASPIR-LOW) 81 MG EC tablet Take 81 mg by mouth daily. 2 tablets (total 162mg ) daily 07/04/12   Larey Dresser, MD  cetirizine (ZYRTEC ALLERGY) 10 MG tablet Take 10 mg by mouth as needed.      Historical Provider, MD  clopidogrel (PLAVIX) 75 MG tablet take 1 tablet by mouth once daily 06/25/15   Larey Dresser, MD  diphenhydrAMINE (BENADRYL) 25 MG tablet Take 25 mg by mouth as needed. For itching      Historical Provider, MD  famotidine (PEPCID) 20 MG tablet Take 20 mg by mouth 2 (two) times daily.     Historical Provider, MD  finasteride (PROSCAR) 5 MG tablet Take 5 mg by mouth daily.      Historical Provider, MD  losartan (COZAAR) 50 MG tablet take 1 tablet by mouth twice a day 07/22/15   Larey Dresser, MD  Multiple Vitamins-Minerals (MULTIVITAL) tablet Take 1 tablet by mouth daily.      Historical Provider, MD  nitroGLYCERIN (NITROSTAT) 0.4 MG SL tablet DISSOLVE 1 TABLET UNDER THE TONGUE IF NEEDED AS DIRECTED. 12/31/14   Burtis Junes, NP  Omega-3 Fatty Acids (FISH OIL) 1000 MG CAPS Take by mouth daily.     Historical Provider, MD  pramipexole (MIRAPEX) 0.5 MG tablet take 2-3 tablet by mouth once daily at bedtime 07/11/15   Noralee Space, MD  rosuvastatin (CRESTOR) 40 MG tablet take 1 tablet by mouth once daily 05/31/15   Larey Dresser, MD  Timolol Maleate 0.5 % (DAILY) SOLN Apply to eye daily. Instill one droplet in OU    Historical Provider, MD  travoprost, benzalkonium, (TRAVATAN) 0.004 % ophthalmic solution Place 1 drop into both eyes at bedtime.      Historical Provider, MD    There were no vitals taken for this visit. Physical Exam  Constitutional: He is oriented to person, place, and time. He appears well-developed and well-nourished. No distress.  HENT:  Head: Normocephalic and atraumatic.  Eyes: Conjunctivae are normal.  Neck: Neck supple.  Cardiovascular: Normal rate, regular rhythm, normal heart sounds and intact distal pulses.   Pulmonary/Chest: Effort normal and breath sounds normal. No respiratory distress. He has no wheezes. He has no rales.  Abdominal: Soft. Bowel sounds are normal. He exhibits no distension. There is no tenderness. There is no rebound.  Musculoskeletal: He exhibits no edema.  Neurological: He is alert and oriented to person, place, and time.  Skin: Skin is warm and dry.  Nursing  note and vitals reviewed.   ED Course  Procedures (including critical care time) Labs Review Labs Reviewed  CBC WITH DIFFERENTIAL/PLATELET  COMPREHENSIVE METABOLIC PANEL  BRAIN NATRIURETIC PEPTIDE  I-STAT [place], ED    Imaging Review No results found. I have personally reviewed and evaluated these images and lab results as part of my medical decision-making.   EKG Interpretation   Date/Time:  Monday September 23 2015 09:11:56 EDT Ventricular Rate:  60 PR Interval:  175 QRS Duration: 92 QT Interval:  421 QTC Calculation: 421 R Axis:   9 Text Interpretation:  Sinus rhythm T wave inversion in V1 new from  previous, no other significant changes Confirmed by LITTLE MD, RACHEL  [id number]) on 09/23/2015 10:24:14 AM      MDM   Final diagnoses:  Chest pain, unspecified chest pain type    Pt presents with CP that started at 2am, initially relieved with pepcid but returned this morning. Pain is now down to 1/10 after nitroglycerine and aspirin. ECG with no acute changes. Will check labs, CXR, call cardiology   Labs negative, trop x1 negative. Pt see by cardiology and will be admitted.   Filed Vitals:   09/24/15 0917 09/24/15 0918 09/24/15  0920 09/24/15 1330  BP: 124/67 117/62 114/61 124/71  Pulse:    63  Temp:    97.6 F (36.4 C)  TempSrc:    Oral  Resp:    20  Height:      Weight:      SpO2:    95%      Jeannett Senior, PA-C 09/24/15 [place], MD 09/25/15 [phone]

## 2015-09-23 NOTE — Progress Notes (Signed)
Patient ID: Willie Lynch, male   DOB: February 06, 1934, 82 y.o.   MRN: [id number] PCP: Dr. Lenna Gilford  81 yo with NSTEMI in 5/10 s/p PCI to LAD and RCA.  In 2012, he had trouble with exertional dyspnea.  He did an ETT-myoview in 6/12 showing no ischemia or infarction.  Echo showed EF 45-50% (stable from prior).  Dyspnea improved for a while then worsened again. This time, he had a repeat cath (8/12).  This showed nonobstructive disease with patent stents.  BNP has not been elevated.  PFTs in 10/12 were normal.  I had him stop Coreg and start nebivolol instead with the thought that dyspnea could be related to beta-2 blockade and use of a more selective beta-1 blocker may help.  This seemed to help his dyspnea.   For about a year, he has had dyspnea walking up steps and when bending over.  Dyspnea walkign up at hill.  No chest pain.  No orthopnea, PND, or palpitations.  BP mildly elevated today.  Last echo in 8/16 was stable with EF 50-55%.    ECG: NSR, nonspecific T wave flattening.   Labs (3/11): K 4.2, creatinine 0.7  Labs (4/11): LFTs nromal, LDL 43, HDL 35  Labs (10/11): HDL 34, LDL 58, LFTs normal  Labs (11/11): K 4.1, creatinine 0.7  Labs (6/12): K 4.1, creatinine 0.8, TSH normal, BNP 19 Labs (7/12): LDL 64, HDL 46 Labs (8/12): K 3.9, creatinine 0.8 Labs (2/13): LDL 41, HDL 23 Labs (8/13): LDL 45, HDL 42, K 3.9, creatinine 0.7 Labs (1/15): K 3.9, creatinine 1.0, LDL 69, HDL 31, HCT 40.7 Labs (1/16): K 4.2, creatinine 0.84, HCT 45, LDL 60, HDL 49 Labs (7/16): BNP 63, K 3.8, creatinine 0.79, HCT 42.6  Allergies:  1) ! Codeine  2) ! Morphine  3) ! Augmentin (Amoxicillin-Pot Clavulanate)   Past Medical History:  1. CAD: NSTEMI 5/10. Rotational atherectomy/PCI with Xience DES x 3 to RCA and rotational atherectomy/PCI with Xience DES to proximal LAD. ETT-myoview (6/12): 7:33, no ischemic ECG changes, EF 59%, no ischemia or infarction on perfusion images.  Left heart cath (8/12): 60-70% distal CFX,  40% in-stent restenosis in proximal LAD.   2. Hyperlipidemia 3. ALLERGIC RHINITIS  4. DIVERTICULOSIS OF COLON - last colon 11/05 w/ divertics only.  5. IRRITABLE BOWEL SYNDROME 6. Hx of ELEVATED PROSTATE SPECIFIC ANTIGEN - followed by Dr Terance Hart for urology  7. Hx of RENAL CYST  8. OSTEOARTHRITIS   9. LOW BACK PAIN, CHRONIC   10. Hx of MIGRAINE HEADACHE  11. HTN: ACEI cough.  12. Esophagitis/GERD  13. Ischemic cardiomyopathy: Mild. Echo (8/10) with EF 45-50%, diffuse hypokinesis, mild AI and mild MR.  Echo (6/12): EF 45-50%, basal to mid inferoposterior hypokinesis, mild aortic insufficiency, grade I diastolic dysfunction.  LV-gram 8/12 with EF 55%.  Echo (8/16) with EF 50-55%, mild LVH, mildly dilated RV with normal systolic function.  14. Dyspnea: PFTs normal.  Possible intolerance to Coreg.   Family History:  mother died in her early 50s from metastatic cancer, family believes it may have been colon cancer that was diagnosed in her 29s  Father deceased age 50 - heart disease  One sibling alive age 48  One sibling deceased age 53 - unknown  One sibling deceased age 21 -unknown   Social History:  Quit smoking 1963  Alcohol - 4 drinks per week  Exercises  Caffeine - 2 cups per day  Widowed 2 children  Previously worked for [employer], now  Optometrist.   ROS: All systems reviewed and negative except as per HPI.    Current Outpatient Prescriptions  Medication Sig Dispense Refill  . albuterol (PROVENTIL HFA;VENTOLIN HFA) 108 (90 BASE) MCG/ACT inhaler Inhale 2 puffs into the lungs every 6 (six) hours as needed for wheezing or shortness of breath. 1 Inhaler 2  . alfuzosin (UROXATRAL) 10 MG 24 hr tablet Take 10 mg by mouth daily.      Marland Kitchen ALPRAZolam (XANAX) 0.5 MG tablet Take 1/2 to 1 tablet by mouth three times daily as needed for nerves 90 tablet 5  . amLODipine (NORVASC) 5 MG tablet Take 1 tablet (5 mg total) by mouth daily. 30 tablet 6  . aspirin (ASPIR-LOW) 81 MG EC tablet  Take 81 mg by mouth daily. 2 tablets (total 162mg ) daily    . cetirizine (ZYRTEC ALLERGY) 10 MG tablet Take 10 mg by mouth as needed.      . clopidogrel (PLAVIX) 75 MG tablet take 1 tablet by mouth once daily 30 tablet 5  . diphenhydrAMINE (BENADRYL) 25 MG tablet Take 25 mg by mouth as needed. For itching      . famotidine (PEPCID) 20 MG tablet Take 20 mg by mouth 2 (two) times daily.     . finasteride (PROSCAR) 5 MG tablet Take 5 mg by mouth daily.      Marland Kitchen losartan (COZAAR) 50 MG tablet take 1 tablet by mouth twice a day 180 tablet 1  . Multiple Vitamins-Minerals (MULTIVITAL) tablet Take 1 tablet by mouth daily.      . nitroGLYCERIN (NITROSTAT) 0.4 MG SL tablet DISSOLVE 1 TABLET UNDER THE TONGUE IF NEEDED AS DIRECTED. 25 tablet 3  . Omega-3 Fatty Acids (FISH OIL) 1000 MG CAPS Take by mouth daily.     . pramipexole (MIRAPEX) 0.5 MG tablet take 2-3 tablet by mouth once daily at bedtime 90 tablet 3  . rosuvastatin (CRESTOR) 40 MG tablet take 1 tablet by mouth once daily 90 tablet 2  . Timolol Maleate 0.5 % (DAILY) SOLN Apply to eye daily. Instill one droplet in OU    . travoprost, benzalkonium, (TRAVATAN) 0.004 % ophthalmic solution Place 1 drop into both eyes at bedtime.       No current facility-administered medications for this visit.    BP 142/80 mmHg  Pulse 58  Ht 5\' 10"  (1.778 m)  Wt 199 lb 3.2 oz (90.357 kg)  BMI 28.58 kg/m2 General: NAD Neck: No JVD, no thyromegaly or thyroid nodule.  Lungs: Clear to auscultation bilaterally with normal respiratory effort. CV: Nondisplaced PMI.  Heart regular S1/S2, no S3/S4, no murmur.  No peripheral edema.  No carotid bruit.  Normal pedal pulses.  Abdomen: Soft, nontender, no hepatosplenomegaly, no distention.  Neurologic: Alert and oriented x 3.  Psych: Normal affect. Extremities: No clubbing or cyanosis.   Assessment/Plan  CORONARY ATHEROSCLEROSIS  No obstructive coronary disease on cath 8/12. Continue ASA 81, beta blocker, Crestor,  losartan.  Given multiple stents (DES) will continue Plavix.  He has had no bleeding problems.  Dyspnea  Echo in 8/16 was stable with EF 50-55%.  He has a chronic stable pattern.   - ?sensitivity to beta blockers => I will have him stop Bystolic to see if this helps symptoms.   - I will arrange for CPX.   HYPERCHOLESTEROLEMIA Check lipids.  HTN He will check BP daily, we will call in 2 wks to see what his numbers are running.     Loralie Champagne 09/23/2015

## 2015-09-23 NOTE — H&P (Signed)
Willie Lynch is an 82 y.o. male.    Primary Cardiologist:Dr. Aundra Dubin  PCP: Noralee Space, MD  Chief Complaint: chest pain  HPI:     NSTEMI in 5/10 s/p PCI to LAD and RCA. In 2012, he had trouble with exertional dyspnea. He did an ETT-myoview in 6/12 showing no ischemia or infarction. Echo showed EF 45-50% (stable from prior). Dyspnea improved for a while then worsened again. This time, he had a repeat cath (8/12). This showed nonobstructive disease with patent stents. BNP has not been elevated. PFTs in 10/12 were normal. Dr. Aundra Dubin had him stop Coreg and start nebivolol instead with the thought that dyspnea could be related to beta-2 blockade and use of a more selective beta-1 blocker may help this was done in past.  This seemed to help his dyspnea.  Last echo in 8/16 was stable with EF 50-55%.  On visit on the 31st pt with more dyspnea and Dr. Aundra Dubin had pt stop his Bystolic and plan was to see if this would improve dyspnea.  Pt was awakened from sleep with "indigestion" ie: tightness in chest he took a pepcid and went to sleep then at 6:30 he was again awakened from sleep with tightness.  At this point he became worried and EMS was called, also wit SOB but no nausea.  In EMS BP elevated and 2 sl NTG given with improvement of pressure ane decrease of BP.    Troponin is negative.  EKG SR with inverted T waves III and V1 similar EKG from 2015, but different than 09/20/15.    CXR clear.  BNP 21.9   Past Medical History  Diagnosis Date  . CAD (coronary artery disease)     NSTEMI 5/10. Rotational atherectomy/PCI w Xience DES x 3 to RCA and rotation atherectomy /PCI w Xience DES to prox LAD  . Abdominal pain, unspecified site   . Allergic rhinitis, cause unspecified   . Diverticulosis of colon (without mention of hemorrhage)     last colon 11/05 by DrSamLeB w divertics only  . Irritable bowel syndrome   . Elevated prostate specific antigen (PSA)     followed by Dr Terance Hart for  urology  . Acquired cyst of kidney   . Osteoarthrosis, unspecified whether generalized or localized, unspecified site   . Lumbago   . Migraine, unspecified, without mention of intractable migraine without mention of status migrainosus   . Hypertension     ACEI cough  . GERD (gastroesophageal reflux disease)     esophagitis  . Hyperlipidemia   . Ischemic cardiomyopathy     mild echo (8/10) w EF 45-50%, diffuse hypokinesis, mild AI and mild MR    Past Surgical History  Procedure Laterality Date  . Rotator cuff surgery      Dr. Shellia Carwin  . Bilat inguinal hernia repairs  7/06    Dr Hassell Done  . Angioplasty    . Cardiac catheterization    . Coronary angioplasty      Family History  Problem Relation Age of Onset  . Heart disease Father   . Cancer      ?? not sure what kind  . Cancer Mother    Social History:  reports that he quit smoking about 54 years ago. He has never used smokeless tobacco. He reports that he drinks alcohol. He reports that he does not use illicit drugs.  Allergies:  Allergies  Allergen Reactions  . Amoxicillin-Pot Clavulanate     REACTION: pt developed HIVES  on augmentin  . Codeine   . Morphine    OUTPATIENT MEDICATIONS:  No current facility-administered medications on file prior to encounter.   Current Outpatient Prescriptions on File Prior to Encounter  Medication Sig Dispense Refill  . albuterol (PROVENTIL HFA;VENTOLIN HFA) 108 (90 BASE) MCG/ACT inhaler Inhale 2 puffs into the lungs every 6 (six) hours as needed for wheezing or shortness of breath. 1 Inhaler 2  . alfuzosin (UROXATRAL) 10 MG 24 hr tablet Take 10 mg by mouth daily.      Marland Kitchen ALPRAZolam (XANAX) 0.5 MG tablet Take 1/2 to 1 tablet by mouth three times daily as needed for nerves 90 tablet 5  . amLODipine (NORVASC) 5 MG tablet Take 1 tablet (5 mg total) by mouth daily. 30 tablet 6  . aspirin (ASPIR-LOW) 81 MG EC tablet Take 162 mg by mouth daily.     . cetirizine (ZYRTEC ALLERGY) 10 MG tablet  Take 10 mg by mouth as needed.      . clopidogrel (PLAVIX) 75 MG tablet take 1 tablet by mouth once daily 30 tablet 5  . diphenhydrAMINE (BENADRYL) 25 MG tablet Take 25 mg by mouth as needed for itching.     . famotidine (PEPCID) 20 MG tablet Take 20 mg by mouth 2 (two) times daily.     . finasteride (PROSCAR) 5 MG tablet Take 5 mg by mouth daily.      Marland Kitchen losartan (COZAAR) 50 MG tablet take 1 tablet by mouth twice a day 180 tablet 1  . Multiple Vitamins-Minerals (MULTIVITAL) tablet Take 1 tablet by mouth daily.      . nitroGLYCERIN (NITROSTAT) 0.4 MG SL tablet DISSOLVE 1 TABLET UNDER THE TONGUE IF NEEDED AS DIRECTED. 25 tablet 3  . Omega-3 Fatty Acids (FISH OIL) 1000 MG CAPS Take 1 capsule by mouth daily.     . pramipexole (MIRAPEX) 0.5 MG tablet take 2-3 tablet by mouth once daily at bedtime 90 tablet 3  . rosuvastatin (CRESTOR) 40 MG tablet take 1 tablet by mouth once daily 90 tablet 2     Results for orders placed or performed during the hospital encounter of 09/23/15 (from the past 48 hour(s))  CBC with Differential     Status: Abnormal   Collection Time: 09/23/15  9:23 AM  Result Value Ref Range   WBC 4.9 4.0 - 10.5 K/uL    Comment: REPEATED TO VERIFY   RBC 4.29 4.22 - 5.81 MIL/uL   Hemoglobin 13.4 13.0 - 17.0 g/dL    Comment: REPEATED TO VERIFY   HCT 38.3 (L) 39.0 - 52.0 %   MCV 89.3 78.0 - 100.0 fL   MCH 31.2 26.0 - 34.0 pg   MCHC 35.0 30.0 - 36.0 g/dL   RDW 13.8 11.5 - 15.5 %   Platelets 83 (L) 150 - 400 K/uL    Comment: PLATELET COUNT CONFIRMED BY SMEAR   Neutrophils Relative % 66 %   Neutro Abs 3.2 1.7 - 7.7 K/uL   Lymphocytes Relative 23 %   Lymphs Abs 1.1 0.7 - 4.0 K/uL   Monocytes Relative 10 %   Monocytes Absolute 0.5 0.1 - 1.0 K/uL   Eosinophils Relative 1 %   Eosinophils Absolute 0.1 0.0 - 0.7 K/uL   Basophils Relative 0 %   Basophils Absolute 0.0 0.0 - 0.1 K/uL  Comprehensive metabolic panel     Status: Abnormal   Collection Time: 09/23/15  9:23 AM  Result  Value Ref Range   Sodium 138 135 - 145  mmol/L   Potassium 3.9 3.5 - 5.1 mmol/L   Chloride 107 101 - 111 mmol/L   CO2 20 (L) 22 - 32 mmol/L   Glucose, Bld 115 (H) 65 - 99 mg/dL   BUN 16 6 - 20 mg/dL   Creatinine, Ser 0.71 0.61 - 1.24 mg/dL   Calcium 9.1 8.9 - 10.3 mg/dL   Total Protein 5.9 (L) 6.5 - 8.1 g/dL   Albumin 3.7 3.5 - 5.0 g/dL   AST 26 15 - 41 U/L   ALT 27 17 - 63 U/L   Alkaline Phosphatase 48 38 - 126 U/L   Total Bilirubin 1.5 (H) 0.3 - 1.2 mg/dL   GFR calc non Af Amer >60 >60 mL/min   GFR calc Af Amer >60 >60 mL/min    Comment: (NOTE) The eGFR has been calculated using the CKD EPI equation. This calculation has not been validated in all clinical situations. eGFR's persistently <60 mL/min signify possible Chronic Kidney Disease.    Anion gap 11 5 - 15  Brain natriuretic peptide     Status: None   Collection Time: 09/23/15  9:23 AM  Result Value Ref Range   B Natriuretic Peptide 21.9 0.0 - 100.0 pg/mL  I-Stat Troponin, ED (not at [facility])     Status: None   Collection Time: 09/23/15  9:31 AM  Result Value Ref Range   Troponin i, poc 0.00 0.00 - 0.08 ng/mL   Comment 3            Comment: Due to the release kinetics of cTnI, a negative result within the first hours of the onset of symptoms does not rule out myocardial infarction with certainty. If myocardial infarction is still suspected, repeat the test at appropriate intervals.    Dg Chest 2 View  09/23/2015  CLINICAL DATA:  Chest pain EXAM: CHEST  2 VIEW COMPARISON:  12/31/2014 FINDINGS: Heart and mediastinal contours are within normal limits. No focal opacities or effusions. No acute bony abnormality. IMPRESSION: No active cardiopulmonary disease. Electronically Signed   By: Rolm Baptise M.D.   On: 09/23/2015 10:03    ROS: General:no colds or fevers, no weight changes Skin:no rashes or ulcers HEENT:no blurred vision, no congestion CV:see HPI PUL:see HPI GI:no diarrhea constipation or melena, no  indigestion GU:no hematuria, no dysuria MS:no joint pain, no claudication Neuro:no syncope, no lightheadedness Endo:no diabetes, no thyroid disease   There were no vitals taken for this visit. PE: General:Pleasant affect, NAD Skin:Warm and dry, brisk capillary refill HEENT:normocephalic, sclera clear, mucus membranes moist Neck:supple, no JVD, no bruits  Heart:S1S2 RRR without murmur, gallup, rub or click Lungs:clear without rales, rhonchi, or wheezes DJT:TSVX, non tender, + BS, do not palpate liver spleen or masses Ext:no lower ext edema, 2+ pedal pulses, 2+ radial pulses Neuro:alert and oriented X 3, MAE, follows commands, + facial symmetry    Assessment/Plan  Principal Problem:   Chest pain- admit to OBS, serial troponin,  Resume bystolic for now.  Plan lexiscan myoview but unable to do today.  Dr. Meda Coffee has seen.   Active Problems:   HYPERCHOLESTEROLEMIA   Essential hypertension   CORONARY ATHEROSCLEROSIS NATIVE CORONARY ARTERY  5/10 s/p PCI to LAD and RCA., last cath 2012 was normal.     Dyspnea  Dorothy Spark Nurse Practitioner Certified [place] Pager [phone] or after 5pm or weekends call [phone] 09/23/2015, 12:55 PM  The patient was seen, examined and discussed with Cecilie Kicks, NP and I agree with  the above.   A very pleasant 82 year old male with known CAD, he has been followed by Dr Aundra Dubin and was supposed to have a stress test for worsening DOE for the last few months but he refused. His carvedilol was switched to nebivolol and then discontinued as a trial fr SOB> He woke up with indigestion like chest pressure on his left side.  Initial BP elevated.  Troponin negative x 2, ECG shows normal SR, unchanged from prior. We will admit and schedule a Lexiscan nuclears stress test.  Dorothy Spark 09/23/2015

## 2015-09-23 NOTE — ED Notes (Signed)
Patient up to restroom.  Able to ambulate independently.  Gait steady and even.

## 2015-09-23 NOTE — ED Notes (Signed)
Attempted report x1. 

## 2015-09-23 NOTE — ED Notes (Signed)
Attempted report x 2 

## 2015-09-23 NOTE — ED Notes (Signed)
Pt arrived via [facility] with c/o non-radiating L chest pain. Pt has hx of MI w/4 stents placed about +/- 9 years ago. Pt sees cardiology and pulmonology outpatient for sob and heart hx. Pt woke up last night with what he thought was indigestion, and took a Zantac but pain and burning persisted. EMS gave 2 tabs NTG and pain upon arrival was 1/10. Pain is intermittent, no sob at this time.

## 2015-09-24 ENCOUNTER — Other Ambulatory Visit: Payer: Medicare Other

## 2015-09-24 ENCOUNTER — Observation Stay (HOSPITAL_COMMUNITY): Payer: Medicare Other

## 2015-09-24 DIAGNOSIS — R079 Chest pain, unspecified: Secondary | ICD-10-CM | POA: Diagnosis not present

## 2015-09-24 DIAGNOSIS — I2510 Atherosclerotic heart disease of native coronary artery without angina pectoris: Secondary | ICD-10-CM | POA: Diagnosis not present

## 2015-09-24 DIAGNOSIS — R0600 Dyspnea, unspecified: Secondary | ICD-10-CM | POA: Diagnosis not present

## 2015-09-24 DIAGNOSIS — I10 Essential (primary) hypertension: Secondary | ICD-10-CM | POA: Diagnosis not present

## 2015-09-24 DIAGNOSIS — I119 Hypertensive heart disease without heart failure: Secondary | ICD-10-CM | POA: Diagnosis not present

## 2015-09-24 DIAGNOSIS — E7800 Pure hypercholesterolemia, unspecified: Secondary | ICD-10-CM | POA: Diagnosis not present

## 2015-09-24 DIAGNOSIS — I208 Other forms of angina pectoris: Secondary | ICD-10-CM | POA: Diagnosis not present

## 2015-09-24 LAB — BASIC METABOLIC PANEL
Anion gap: 11 (ref 5–15)
BUN: 14 mg/dL (ref 6–20)
CO2: 22 mmol/L (ref 22–32)
Calcium: 9 mg/dL (ref 8.9–10.3)
Chloride: 106 mmol/L (ref 101–111)
Creatinine, Ser: 0.78 mg/dL (ref 0.61–1.24)
GFR calc Af Amer: >60 mL/min (ref >60)
GFR calc non Af Amer: >60 mL/min (ref >60)
Glucose, Bld: 101 mg/dL — ABNORMAL HIGH (ref 65–99)
Potassium: 3.6 mmol/L (ref 3.5–5.1)
Sodium: 139 mmol/L (ref 135–145)

## 2015-09-24 LAB — HEMOGLOBIN A1C
HEMOGLOBIN A1C: 5.8 % — AB (ref 4.8–5.6)
MEAN PLASMA GLUCOSE: 120 mg/dL

## 2015-09-24 LAB — HEPATIC FUNCTION PANEL
ALK PHOS: 50 U/L (ref 38–126)
ALT: 27 U/L (ref 17–63)
AST: 26 U/L (ref 15–41)
Albumin: 3.7 g/dL (ref 3.5–5.0)
BILIRUBIN DIRECT: 0.2 mg/dL (ref 0.1–0.5)
BILIRUBIN INDIRECT: 1.7 mg/dL — AB (ref 0.3–0.9)
TOTAL PROTEIN: 6 g/dL — AB (ref 6.5–8.1)
Total Bilirubin: 1.9 mg/dL — ABNORMAL HIGH (ref 0.3–1.2)

## 2015-09-24 LAB — TROPONIN I: Troponin I: <0.03 ng/mL (ref <0.031)

## 2015-09-24 LAB — LIPID PANEL
Cholesterol: 108 mg/dL (ref 0–200)
HDL: 37 mg/dL — ABNORMAL LOW (ref >40)
LDL Cholesterol: 46 mg/dL (ref 0–99)
Total CHOL/HDL Ratio: 2.9 RATIO
Triglycerides: 124 mg/dL (ref <150)
VLDL: 25 mg/dL (ref 0–40)

## 2015-09-24 LAB — NM MYOCAR MULTI W/SPECT W/WALL MOTION / EF
CHL CUP RESTING HR STRESS: 67 {beats}/min
CSEPED: 5 min
CSEPEW: 1 METS
CSEPPHR: 94 {beats}/min
Exercise duration (sec): 18 s
MPHR: 140 {beats}/min
Percent HR: 67 %

## 2015-09-24 LAB — CBC
HCT: 40.5 % (ref 39.0–52.0)
Hemoglobin: 14 g/dL (ref 13.0–17.0)
MCH: 31 pg (ref 26.0–34.0)
MCHC: 34.6 g/dL (ref 30.0–36.0)
MCV: 89.8 fL (ref 78.0–100.0)
Platelets: 85 10*3/uL — ABNORMAL LOW (ref 150–400)
RBC: 4.51 MIL/uL (ref 4.22–5.81)
RDW: 13.9 % (ref 11.5–15.5)
WBC: 5.2 10*3/uL (ref 4.0–10.5)

## 2015-09-24 MED ORDER — REGADENOSON 0.4 MG/5ML IV SOLN
0.4000 mg | Freq: Once | INTRAVENOUS | Status: AC
  Administered 2015-09-24: 0.4 mg via INTRAVENOUS

## 2015-09-24 MED ORDER — AMLODIPINE BESYLATE 10 MG PO TABS: 10.0000 mg | ORAL_TABLET | Freq: Every day | ORAL | Status: DC

## 2015-09-24 MED ORDER — TECHNETIUM TC 99M SESTAMIBI GENERIC - CARDIOLITE
30.0000 | Freq: Once | INTRAVENOUS | Status: AC | PRN
Start: 2015-09-24 — End: 2015-09-24
  Administered 2015-09-24: 30 via INTRAVENOUS

## 2015-09-24 MED ORDER — REGADENOSON 0.4 MG/5ML IV SOLN
INTRAVENOUS | Status: AC
  Administered 2015-09-24: 11:00:00
  Filled 2015-09-24: qty 5

## 2015-09-24 MED ORDER — TECHNETIUM TC 99M SESTAMIBI GENERIC - CARDIOLITE
10.0000 | Freq: Once | INTRAVENOUS | Status: AC | PRN
  Administered 2015-09-24: 10 via INTRAVENOUS

## 2015-09-24 NOTE — Care Management Obs Status (Signed)
[facility] NOTIFICATION   Patient Details  Name: Willie Lynch MRN: [id number] Date of Birth: [birth year]   Medicare Observation Status Notification Given:  Yes    Dawayne Patricia, RN 09/24/2015, 2:28 PM

## 2015-09-24 NOTE — Discharge Summary (Signed)
Discharge Summary    Patient ID: Willie Lynch,  MRN: [id number], DOB/AGE: 03/18/1934 82 y.o.  Admit date: 09/23/2015 Discharge date: 09/24/2015  Primary Care Provider: NADEL,Willie M Primary Cardiologist: Dr Willie Lynch  Discharge Diagnoses    Principal Problem:   Chest pain Active Problems:   HYPERCHOLESTEROLEMIA   Essential hypertension   CORONARY ATHEROSCLEROSIS NATIVE CORONARY ARTERY   Dyspnea   Allergies Allergies  Allergen Reactions  . Amoxicillin-Pot Clavulanate     REACTION: pt developed HIVES on augmentin  . Codeine   . Morphine     Diagnostic Studies/Procedures    Lexiscan MV _____________   History of Present Illness     82 yo male w/ hx stents to LAD/RCA, patent at 2012 cath, EF 50-55% echo 2016. Admitted 04/03 w/ SOB, chest pain.   Hospital Course     Consultants: None   His symptoms resolved with SL NTG x 2 by EMS. His blood pressure had been elevated that day and the nitro helped this as well. He had not had his home medications and they were restarted, with improvement in his blood pressure.   HIs cardiac enzymes were negative for MI. His Norvasc was increased for better BP control. A Lexiscan MV was performed 04/04, results are below. It showed no scar or ischemia and his EF was 60%. His labs were reviewed and his platelets were a little lower than usual, but have been below 100 before. HIs blood sugars were mildly elevated but his A1c was only 5.8. His bilirubin was mildly elevated and he is asked to f/u with his primary MD for this.  [name] was evaluated by Dr Willie Lynch. He was ambulating without chest pain or SOB. No further inpatient workup was indicated and he is considered stable for discharge, to follow up as an outpatient. _____________  Discharge Vitals Blood pressure 124/71, pulse 63, temperature 97.6 F (36.4 C), temperature source Oral, resp. rate 20, height 5\' 10"  (1.778 m), weight 190 lb 11.2 oz (86.501 kg), SpO2 95 %.  Filed Weights    09/23/15 2017 09/24/15 0537  Weight: 192 lb (87.091 kg) 190 lb 11.2 oz (86.501 kg)    Labs & Radiologic Studies    CBC  Recent Labs  09/23/15 0923 09/23/15 2359  WBC 4.9 5.2  NEUTROABS 3.2  --   HGB 13.4 14.0  HCT 38.3* 40.5  MCV 89.3 89.8  PLT 83* 85*   Basic Metabolic Panel  Recent Labs  09/23/15 0923 09/23/15 1244 09/23/15 2359  NA 138  --  139  K 3.9  --  3.6  CL 107  --  106  CO2 20*  --  22  GLUCOSE 115*  --  101*  BUN 16  --  14  CREATININE 0.71  --  0.78  CALCIUM 9.1  --  9.0  MG  --  2.2  --    Liver Function Tests  Recent Labs  09/23/15 0923 09/23/15 2359  AST 26 26  ALT 27 27  ALKPHOS 48 50  BILITOT 1.5* 1.9*  PROT 5.9* 6.0*  ALBUMIN 3.7 3.7   Cardiac Enzymes  Recent Labs  09/23/15 1244 09/23/15 2003 09/23/15 2359  TROPONINI <0.03 <0.03 <0.03   Hemoglobin A1C  Recent Labs  09/23/15 1244  HGBA1C 5.8*   Fasting Lipid Panel  Recent Labs  09/23/15 2359  CHOL 108  HDL 37*  LDLCALC 46  TRIG 124  CHOLHDL 2.9   Thyroid Function Tests  Recent Labs  09/23/15 1244  TSH 3.334   _____________  Dg Chest 2 View 09/23/2015  CLINICAL DATA:  Chest pain EXAM: CHEST  2 VIEW COMPARISON:  12/31/2014 FINDINGS: Heart and mediastinal contours are within normal limits. No focal opacities or effusions. No acute bony abnormality. IMPRESSION: No active cardiopulmonary disease. Electronically Signed   By: Willie Lynch M.D.   On: 09/23/2015 10:03   Nm Myocar Multi W/spect W/wall Motion / Ef 09/24/2015  CLINICAL DATA:  Chest pain. Hypertension. Coronary artery disease with angioplasty and stents placed 2012. EXAM: MYOCARDIAL IMAGING WITH SPECT (REST AND PHARMACOLOGIC-STRESS) GATED LEFT VENTRICULAR WALL MOTION STUDY LEFT VENTRICULAR EJECTION FRACTION TECHNIQUE: Standard myocardial SPECT imaging was performed after resting intravenous injection of 10 mCi Tc-84m sestamibi. Subsequently, intravenous infusion of Lexiscan was performed under the supervision  of the Cardiology staff. At peak effect of the drug, 30 mCi Tc-55m sestamibi was injected intravenously and standard myocardial SPECT imaging was performed. Quantitative gated imaging was also performed to evaluate left ventricular wall motion, and estimate left ventricular ejection fraction. COMPARISON:  None. FINDINGS: Perfusion: No decreased activity in the left ventricle on stress imaging to suggest reversible ischemia or infarction. Wall Motion: Normal left ventricular wall motion. No left ventricular dilation. Left Ventricular Ejection Fraction: 60 % End diastolic volume 99991111 ml End systolic volume 40 ml IMPRESSION: 1. No reversible ischemia or infarction. 2. Normal left ventricular wall motion. 3. Left ventricular ejection fraction 60% 4. Low-risk stress test findings*. *2012 Appropriate Use Criteria for Coronary Revascularization Focused Update: J Am Coll Cardiol. [id number]. http://content.airportbarriers.com.aspx?articleid=[email or web address] Electronically Signed   By: Willie Lynch M.D.   On: 09/24/2015 12:51   Disposition   Pt is being discharged home today in good condition.  Follow-up Plans & Appointments    Follow-up Information    Follow up with Willie Champagne, MD On 01/01/2016.   Specialty:  Cardiology   Why:  Keep appointment.   Contact information:   [id number] [place] [address] [phone]       Schedule an appointment as soon as possible for a visit with Lynch,Willie M, MD.   Specialty:  Pulmonary Disease   Contact information:   [place] [address] [phone]      Discharge Instructions    Diet - low sodium heart healthy    Complete by:  As directed      Increase activity slowly    Complete by:  As directed            Discharge Medications   Current Discharge Medication List    CONTINUE these medications which have CHANGED   Details  amLODipine (NORVASC) 10 MG tablet Take 1 tablet (10 mg total) by mouth  daily. Qty: 30 tablet, Refills: 11      CONTINUE these medications which have NOT CHANGED   Details  albuterol (PROVENTIL HFA;VENTOLIN HFA) 108 (90 BASE) MCG/ACT inhaler Inhale 2 puffs into the lungs every 6 (six) hours as needed for wheezing or shortness of breath. Qty: 1 Inhaler, Refills: 2   Associated Diagnoses: Shortness of breath    alfuzosin (UROXATRAL) 10 MG 24 hr tablet Take 10 mg by mouth daily.      ALPRAZolam (XANAX) 0.5 MG tablet Take 1/2 to 1 tablet by mouth three times daily as needed for nerves Qty: 90 tablet, Refills: 5    aspirin (ASPIR-LOW) 81 MG EC tablet Take 162 mg by mouth daily.    Associated Diagnoses: Coronary atherosclerosis of native coronary artery  BYSTOLIC 5 MG tablet Take 2.5 mg by mouth daily. Refills: 0    cetirizine (ZYRTEC ALLERGY) 10 MG tablet Take 10 mg by mouth as needed.      clopidogrel (PLAVIX) 75 MG tablet take 1 tablet by mouth once daily Qty: 30 tablet, Refills: 5    diphenhydrAMINE (BENADRYL) 25 MG tablet Take 25 mg by mouth as needed for itching.     famotidine (PEPCID) 20 MG tablet Take 20 mg by mouth 2 (two) times daily.     finasteride (PROSCAR) 5 MG tablet Take 5 mg by mouth daily.      losartan (COZAAR) 50 MG tablet take 1 tablet by mouth twice a day Qty: 180 tablet, Refills: 1    Multiple Vitamins-Minerals (MULTIVITAL) tablet Take 1 tablet by mouth daily.      nitroGLYCERIN (NITROSTAT) 0.4 MG SL tablet DISSOLVE 1 TABLET UNDER THE TONGUE IF NEEDED AS DIRECTED. Qty: 25 tablet, Refills: 3    Omega-3 Fatty Acids (FISH OIL) 1000 MG CAPS Take 1 capsule by mouth daily.     pramipexole (MIRAPEX) 0.5 MG tablet take 2-3 tablet by mouth once daily at bedtime Qty: 90 tablet, Refills: 3    rosuvastatin (CRESTOR) 40 MG tablet take 1 tablet by mouth once daily Qty: 90 tablet, Refills: 2    timolol (TIMOPTIC-XR) 0.5 % ophthalmic gel-forming Place 1 drop into both eyes every morning. Refills: 0    TRAVATAN Z 0.004 % SOLN  ophthalmic solution Place 1 drop into both eyes every evening. Refills: 0         Outstanding Labs/Studies   None  Duration of Discharge Encounter   Greater than 30 minutes including physician time.  Jonetta Speak NP 09/24/2015, 4:29 PM

## 2015-09-24 NOTE — Progress Notes (Signed)
Lexiscan MV performed, 1 day study. [place] to read.  Rosaria Ferries, PA-C 09/24/2015 1:19 PM [facility] [phone]

## 2015-09-24 NOTE — Progress Notes (Signed)
Patient Name: Willie Lynch Date of Encounter: 09/24/2015  Principal Problem:   Chest pain Active Problems:   HYPERCHOLESTEROLEMIA   Essential hypertension   CORONARY ATHEROSCLEROSIS NATIVE CORONARY ARTERY   Dyspnea   Primary Cardiologist: Dr Aundra Dubin  Patient Profile: 82 yo male w/ hx stents to LAD/RCA, patent 2012 cath, EF 50-55% echo 2016. Admitted 04/03 w/ SOB, chest pain.  SUBJECTIVE: No more chest pain, feels better  OBJECTIVE Filed Vitals:   09/23/15 1630 09/23/15 1715 09/23/15 2017 09/24/15 0537  BP: 159/83 161/87 150/74 111/63  Pulse: 61 65 66 81  Temp:   97.8 F (36.6 C) 97.6 F (36.4 C)  TempSrc:   Oral Oral  Resp: 15 16 16 17   Height:   5\' 10"  (1.778 m)   Weight:   192 lb (87.091 kg) 190 lb 11.2 oz (86.501 kg)  SpO2: 98% 96% 94% 94%    Intake/Output Summary (Last 24 hours) at 09/24/15 0911 Last data filed at 09/24/15 0600  Gross per 24 hour  Intake    280 ml  Output      0 ml  Net    280 ml   Filed Weights   09/23/15 2017 09/24/15 0537  Weight: 192 lb (87.091 kg) 190 lb 11.2 oz (86.501 kg)    PHYSICAL EXAM General: Well developed, well nourished, male in no acute distress. Head: Normocephalic, atraumatic.  Neck: Supple without bruits,no elevated JVD. Lungs:  Resp regular and unlabored, CTA. Heart: RRR, S1, S2, no S3, S4, or murmur; no rub. Abdomen: Soft, non-tender, non-distended, BS + x 4.  Extremities: No clubbing, cyanosis, edema.  Neuro: Alert and oriented X 3. Moves all extremities spontaneously. Psych: Normal affect.  LABS: CBC:  Recent Labs  09/23/15 0923 09/23/15 2359  WBC 4.9 5.2  NEUTROABS 3.2  --   HGB 13.4 14.0  HCT 38.3* 40.5  MCV 89.3 89.8  PLT 83* 85*   Basic Metabolic Panel:  Recent Labs  09/23/15 0923 09/23/15 1244 09/23/15 2359  NA 138  --  139  K 3.9  --  3.6  CL 107  --  106  CO2 20*  --  22  GLUCOSE 115*  --  101*  BUN 16  --  14  CREATININE 0.71  --  0.78  CALCIUM 9.1  --  9.0  MG  --  2.2  --     Liver Function Tests:  Recent Labs  09/23/15 0923 09/23/15 2359  AST 26 26  ALT 27 27  ALKPHOS 48 50  BILITOT 1.5* 1.9*  PROT 5.9* 6.0*  ALBUMIN 3.7 3.7   Cardiac Enzymes:  Recent Labs  09/23/15 1244 09/23/15 2003 09/23/15 2359  TROPONINI <0.03 <0.03 <0.03    Recent Labs  09/23/15 0931  TROPIPOC 0.00   BNP:  B NATRIURETIC PEPTIDE  Date/Time Value Ref Range Status  09/23/2015 09:23 AM 21.9 0.0 - 100.0 pg/mL Final   Hemoglobin A1C:  Recent Labs  09/23/15 1244  HGBA1C 5.8*   Fasting Lipid Panel:  Recent Labs  09/23/15 2359  CHOL 108  HDL 37*  LDLCALC 46  TRIG 124  CHOLHDL 2.9   Thyroid Function Tests:  Recent Labs  09/23/15 1244  TSH 3.334   TELE:        Radiology/Studies: Dg Chest 2 View  09/23/2015  CLINICAL DATA:  Chest pain EXAM: CHEST  2 VIEW COMPARISON:  12/31/2014 FINDINGS: Heart and mediastinal contours are within normal limits. No focal opacities or effusions. No  acute bony abnormality. IMPRESSION: No active cardiopulmonary disease. Electronically Signed   By: Rolm Baptise M.D.   On: 09/23/2015 10:03     Current Medications:  . alfuzosin  10 mg Oral Daily  . amLODipine  5 mg Oral Daily  . aspirin EC  81 mg Oral Daily  . clopidogrel  75 mg Oral Daily  . famotidine  20 mg Oral BID  . finasteride  5 mg Oral Daily  . heparin  5,000 Units Subcutaneous 3 times per day  . latanoprost  1 drop Both Eyes QHS  . loratadine  10 mg Oral Daily  . losartan  50 mg Oral BID  . multivitamin with minerals  1 tablet Oral Daily  . nebivolol  2.5 mg Oral Daily  . nitroGLYCERIN  1 inch Topical 4 times per day  . omega-3 acid ethyl esters  1 g Oral Daily  . pramipexole  0.5 mg Oral QHS  . rosuvastatin  40 mg Oral Daily  . timolol  1 drop Both Eyes q morning - 10a   . sodium chloride      ASSESSMENT AND PLAN: Principal Problem:   Chest pain - ez neg MI - for MV today - on ASA, Plavix, BB, statin, ARB  Active Problems:    HYPERCHOLESTEROLEMIA - continue statin - HDL a little low    Essential hypertension - BP elevated last pm, better today    CORONARY ATHEROSCLEROSIS NATIVE CORONARY ARTERY - see above    Dyspnea - still has at time with exertion    Thrombocytopenia - platelets lower than previous, but 2 yr ago were 99  Signed, Barrett, Suanne Marker , PA-C 9:11 AM 09/24/2015  The patient was seen, examined and discussed with Rosaria Ferries, PA-C and I agree with the above.   A very pleasant 82 year old male with known CAD, he has been followed by Dr Aundra Dubin and was supposed to have a stress test for worsening DOE for the last few months but he refused. His carvedilol was switched to nebivolol and then discontinued as a trial fr SOB, He woke up with indigestion like chest pressure on his left side. Initial BP elevated. Troponin negative x 3, ECG shows normal SR, unchanged from prior. He underwent Lexiscan nuclears stress test, if negative for ischemia we will discharge today.  His BP has been elevated, I would increase amlodipine to 10 mg po daily.  Dorothy Spark 09/24/2015

## 2015-09-25 ENCOUNTER — Telehealth: Payer: Self-pay | Admitting: Cardiology

## 2015-09-25 NOTE — Telephone Encounter (Signed)
New Message:  Pt called in stating that he would like to speak with Dr. Claris Gladden nurse about his 2 day stay in the hospital. Please f/u with him

## 2015-09-25 NOTE — Telephone Encounter (Signed)
The pt is requesting a call from Dr Claris Gladden nurse, Webb Silversmith, to discuss his recent hospital stay.  He states that the tests that were ordered by Dr Aundra Dubin at his last OV with him was canceled while he was in the hospital. He states that he has a doctors apt. Friday afternoon so he is requesting that Webb Silversmith call him in the morning.

## 2015-09-27 ENCOUNTER — Ambulatory Visit (INDEPENDENT_AMBULATORY_CARE_PROVIDER_SITE_OTHER): Payer: Medicare Other | Admitting: Acute Care

## 2015-09-27 ENCOUNTER — Encounter: Payer: Self-pay | Admitting: Acute Care

## 2015-09-27 ENCOUNTER — Telehealth: Payer: Self-pay | Admitting: Gastroenterology

## 2015-09-27 ENCOUNTER — Telehealth: Payer: Self-pay | Admitting: Cardiology

## 2015-09-27 VITALS — BP 118/76 | HR 63 | Temp 98.2°F | Ht 70.5 in | Wt 197.8 lb

## 2015-09-27 DIAGNOSIS — Z8719 Personal history of other diseases of the digestive system: Secondary | ICD-10-CM | POA: Diagnosis not present

## 2015-09-27 DIAGNOSIS — R1013 Epigastric pain: Secondary | ICD-10-CM | POA: Diagnosis not present

## 2015-09-27 DIAGNOSIS — K30 Functional dyspepsia: Secondary | ICD-10-CM

## 2015-09-27 NOTE — Telephone Encounter (Signed)
See phone note 09/25/15

## 2015-09-27 NOTE — Telephone Encounter (Signed)
Pt advised.

## 2015-09-27 NOTE — Telephone Encounter (Signed)
Follow Up  ° °Pt returned the call  °

## 2015-09-27 NOTE — Telephone Encounter (Signed)
LMTCB

## 2015-09-27 NOTE — Telephone Encounter (Signed)
Pt states he was off Bystolic for 3 days prior to ED 09/24/15 and did not notice a difference in his shortness of breath.   Pt states he has seen Dr Lenna Gilford in the past and has an appt with pulmonary this afternoon. Pt states he prefers to hold off on having CPX stress test for now. Pt advised I will forward to Dr Aundra Dubin for review.

## 2015-09-27 NOTE — Patient Instructions (Addendum)
It is nice to meet you today. I am glad you are feeling better. We will refer you to GI specialist for evaluation of elevated bilirubin and indigestion. Continue your pepcid  Once daily We will schedule you for a fasting lipid liver profile. Nothing to eat or drink after midnight before fasting test. Follow up with Dr. Aundra Dubin for cardio-pulmonary stress test if you are interested in doing it. Follow up with Dr. Lenna Gilford as needed. Please contact office for sooner follow up if symptoms do not improve or worsen or seek emergency care

## 2015-09-27 NOTE — Progress Notes (Signed)
Subjective:    Patient ID: Willie Lynch, male    DOB: [birth year], 82 y.o.   MRN: [id number]  HPI 82 year old male seen by D. Nadel with history of exertional dyspnea,CAD- NSTEMI 5/10 w/ PCI to LAD & RCA, & followed by Dr. Aundra Dubin on ASA, Plavix, Losartan; and Hyperchol on Cres40. Mild ischemic cardiomyopathy with EF of 60% ( 09/24/2015).  Procedures/Significant Events:  Hospital admission for chest pain Admit date: 09/23/2015 Discharge date: 09/24/2015  09/23/2015 CXR>>> no acute cardiopulmonary disease 09/24/2015 NM Lexiscan MV>>>No reversible ischemia or infarction.Normal left ventricular wall motion.Left ventricular ejection fraction 60%: low risk stress findings. 09/23/2015 EKG>>>T wave inversion in V1 new from previous, no other significant changes   09/27/2015: Follow-up hospital visit.   Patient presents to the office for follow-up of hospital admission for chest pain. Symptoms resolved with sublingual nitroglycerin 2. Cardiac enzymes were negative for MI . Nuclear med Lexiscan performed indicated low risk stress findings .Today he presents to the office with complete resolution of chest pain. He is doing well. Denies fever, chest pain, productive or non productive cough, no significant sputum production, denies orthopnea, hemoptysis, calf or leg pain. He continues to complain of slight exertional dyspnea when walking on an incline or climbing stairs. He continues to use his albuterol inhaler as rescue for this mild dyspnea. Dr. Aundra Dubin, the patient's cardiologist, had suggested a cardiopulmonary stress test as further evaluation of this exertional dyspnea. The patient never pursued the study. I have encouraged him to follow through with this study if he continues to have this exertional dyspnea. Additionally while in the hospital his Norvasc was increased for better blood pressure control, A1c was well controlled at 5.8, platelets were noted to be below 100 at that time.It was noted in the  hospital that he had a slight elevation in his bilirubin, and patient is requesting a GI referral for complete workup. He suspects what he initially thought was chest pain is possibly indigestion/gastric pain. He was due for a fasting lipid liver profile while in the hospital, and we will reorder the lab work so it is available for his GI workup.    Current outpatient prescriptions:  .  albuterol (PROVENTIL HFA;VENTOLIN HFA) 108 (90 BASE) MCG/ACT inhaler, Inhale 2 puffs into the lungs every 6 (six) hours as needed for wheezing or shortness of breath., Disp: 1 Inhaler, Rfl: 2 .  alfuzosin (UROXATRAL) 10 MG 24 hr tablet, Take 10 mg by mouth daily.  , Disp: , Rfl:  .  ALPRAZolam (XANAX) 0.5 MG tablet, Take 1/2 to 1 tablet by mouth three times daily as needed for nerves, Disp: 90 tablet, Rfl: 5 .  amLODipine (NORVASC) 10 MG tablet, Take 1 tablet (10 mg total) by mouth daily., Disp: 30 tablet, Rfl: 11 .  aspirin (ASPIR-LOW) 81 MG EC tablet, Take 162 mg by mouth daily. , Disp: , Rfl:  .  BYSTOLIC 5 MG tablet, Take 2.5 mg by mouth daily., Disp: , Rfl: 0 .  cetirizine (ZYRTEC ALLERGY) 10 MG tablet, Take 10 mg by mouth as needed.  , Disp: , Rfl:  .  clopidogrel (PLAVIX) 75 MG tablet, take 1 tablet by mouth once daily, Disp: 30 tablet, Rfl: 5 .  diphenhydrAMINE (BENADRYL) 25 MG tablet, Take 25 mg by mouth as needed for itching. , Disp: , Rfl:  .  famotidine (PEPCID) 20 MG tablet, Take 20 mg by mouth 2 (two) times daily. , Disp: , Rfl:  .  finasteride (PROSCAR) 5 MG  tablet, Take 5 mg by mouth daily.  , Disp: , Rfl:  .  losartan (COZAAR) 50 MG tablet, take 1 tablet by mouth twice a day, Disp: 180 tablet, Rfl: 1 .  Multiple Vitamins-Minerals (MULTIVITAL) tablet, Take 1 tablet by mouth daily.  , Disp: , Rfl:  .  nitroGLYCERIN (NITROSTAT) 0.4 MG SL tablet, DISSOLVE 1 TABLET UNDER THE TONGUE IF NEEDED AS DIRECTED., Disp: 25 tablet, Rfl: 3 .  Omega-3 Fatty Acids (FISH OIL) 1000 MG CAPS, Take 1 capsule by mouth  daily. , Disp: , Rfl:  .  pramipexole (MIRAPEX) 0.5 MG tablet, take 2-3 tablet by mouth once daily at bedtime, Disp: 90 tablet, Rfl: 3 .  rosuvastatin (CRESTOR) 40 MG tablet, take 1 tablet by mouth once daily, Disp: 90 tablet, Rfl: 2 .  timolol (TIMOPTIC-XR) 0.5 % ophthalmic gel-forming, Place 1 drop into both eyes every morning., Disp: , Rfl: 0 .  TRAVATAN Z 0.004 % SOLN ophthalmic solution, Place 1 drop into both eyes every evening., Disp: , Rfl: 0   Past Medical History  Diagnosis Date  . CAD (coronary artery disease)     NSTEMI 5/10. Rotational atherectomy/PCI w Xience DES x 3 to RCA and rotation atherectomy /PCI w Xience DES to prox LAD  . Abdominal pain, unspecified site   . Allergic rhinitis, cause unspecified   . Diverticulosis of colon (without mention of hemorrhage)     last colon 11/05 by DrSamLeB w divertics only  . Irritable bowel syndrome   . Elevated prostate specific antigen (PSA)     followed by Dr Terance Hart for urology  . Acquired cyst of kidney   . Osteoarthrosis, unspecified whether generalized or localized, unspecified site   . Lumbago   . Migraine, unspecified, without mention of intractable migraine without mention of status migrainosus   . Hypertension     ACEI cough  . GERD (gastroesophageal reflux disease)     esophagitis  . Hyperlipidemia   . Ischemic cardiomyopathy     mild echo (8/10) w EF 45-50%, diffuse hypokinesis, mild AI and mild MR    Allergies  Allergen Reactions  . Amoxicillin-Pot Clavulanate     REACTION: pt developed HIVES on augmentin  . Codeine   . Morphine     Review of Systems Constitutional:   No  weight loss, night sweats,  Fevers, chills, fatigue, or  lassitude.  HEENT:   No headaches,  Difficulty swallowing,  Tooth/dental problems, or  Sore throat,                No sneezing, itching, ear ache, nasal congestion, post nasal drip,   CV:  No chest pain,  Orthopnea, PND, swelling in lower extremities, anasarca, dizziness,  palpitations, syncope.   GI  + heartburn,+indigestion, denies abdominal pain, nausea, vomiting, diarrhea, change in bowel habits, loss of appetite, bloody stools.   Resp:+ shortness of breath with exertion not at rest.  No excess mucus, no productive cough,  No non-productive cough,  No coughing up of blood.  No change in color of mucus.  No wheezing.  No chest wall deformity  Skin: no rash or lesions.  GU: no dysuria, change in color of urine, no urgency or frequency.  No flank pain, no hematuria   MS:  No joint pain or swelling.  No decreased range of motion.  No back pain.  Psych:  No change in mood or affect. No depression or anxiety.  No memory loss.        Objective:  Physical Exam  BP 118/76 mmHg  Pulse 63  Temp(Src) 98.2 F (36.8 C) (Oral)  Ht 5' 10.5" (1.791 m)  Wt 197 lb 12.8 oz (89.721 kg)  BMI 27.97 kg/m2  SpO2 97%  Physical Exam:  General- No distress,  A&Ox3 ENT: No sinus tenderness, TM clear, pale nasal mucosa, no oral exudate,no post nasal drip, no LAN Cardiac: S1, S2, regular rate and rhythm, no murmur Chest: No wheeze/ rales/ dullness; no accessory muscle use, no nasal flaring, no sternal retractions Abd.: Soft Non-tender Ext: No clubbing cyanosis, edema Neuro:  normal strength Skin: No rashes, warm and dry Psych: normal mood and behavior Magdalen Spatz, AGACNP-BC [place] Pager # [phone] 09/27/2015    Assessment & Plan:

## 2015-09-27 NOTE — Telephone Encounter (Signed)
Pt states he went to ED 09/24/15. Pt states everything checked out. After testing was completed in hospital, pt states he was told provider at hospital felt strongly that shortness of breath was not due to heart or Bystolic.

## 2015-09-27 NOTE — Telephone Encounter (Signed)
That is fine, see Dr Lenna Gilford.

## 2015-09-27 NOTE — Assessment & Plan Note (Signed)
Recent admission for chest pain/ now suspect GI etiology Slight elevation of bilirubin ( 1.5 / 1.9 mg/dL ) Plan: We will refer you to GI specialist for evaluation of elevated bilirubin and indigestion. Continue your pepcid  Once daily We will schedule you for a fasting lipid liver profile. Nothing to eat or drink after midnight before fasting test. Follow up with Dr. Aundra Dubin for cardio-pulmonary stress test if you are interested in doing it. Follow up with Dr. Lenna Gilford as needed. Please contact office for sooner follow up if symptoms do not improve or worsen or seek emergency care

## 2015-09-30 ENCOUNTER — Other Ambulatory Visit (INDEPENDENT_AMBULATORY_CARE_PROVIDER_SITE_OTHER): Payer: Medicare Other

## 2015-09-30 DIAGNOSIS — R1013 Epigastric pain: Secondary | ICD-10-CM

## 2015-09-30 DIAGNOSIS — K30 Functional dyspepsia: Secondary | ICD-10-CM

## 2015-09-30 LAB — HEPATIC FUNCTION PANEL
ALBUMIN: 4.1 g/dL (ref 3.5–5.2)
ALK PHOS: 72 U/L (ref 39–117)
ALT: 21 U/L (ref 0–53)
AST: 17 U/L (ref 0–37)
BILIRUBIN TOTAL: 1.5 mg/dL — AB (ref 0.2–1.2)
Bilirubin, Direct: 0.2 mg/dL (ref 0.0–0.3)
Total Protein: 6.9 g/dL (ref 6.0–8.3)

## 2015-10-01 NOTE — Telephone Encounter (Signed)
The pt has been scheduled for 12/03/15 and also put on a wait list.  His bili has been consistent for several years.  I will send to Dr Ardis Hughs for review.

## 2015-10-02 ENCOUNTER — Encounter (HOSPITAL_COMMUNITY): Payer: Medicare Other

## 2015-10-03 NOTE — Telephone Encounter (Signed)
I agree, bili elevated at least past 5-7 years.

## 2015-10-22 ENCOUNTER — Telehealth: Payer: Self-pay | Admitting: Pulmonary Disease

## 2015-10-22 DIAGNOSIS — R17 Unspecified jaundice: Secondary | ICD-10-CM

## 2015-10-22 NOTE — Telephone Encounter (Signed)
Patient states that Judson Roch ordered bloodwork for him at his last OV and he wants to know if he can get this bloodwork done again.  He says that he is feeling a lot better and would like to see if he can avoid going to GI appointment in 6 weeks.    Sarah, please advise.

## 2015-10-23 NOTE — Telephone Encounter (Signed)
LMOM per pt request.  Lab order to recheck Bilirubin entered per DE Lenna Gilford his PCP.  Will contact pt with results.

## 2015-10-23 NOTE — Telephone Encounter (Signed)
Spoke with pt and he states that all GI symptoms are better now and has made some changes in his diet.  Pt would like to know if he can come for repeat Bilirubin and if it is normal he can cancel appt with GI.  Sarah, please advise

## 2015-10-23 NOTE — Telephone Encounter (Signed)
Please call the patient back and let him know he can call the GI doctor of his PCP for a repeat bilirubin if he would like to re-check it prior to his appointment with the GI doc. Thanks.

## 2015-10-24 ENCOUNTER — Other Ambulatory Visit (INDEPENDENT_AMBULATORY_CARE_PROVIDER_SITE_OTHER): Payer: Medicare Other

## 2015-10-24 DIAGNOSIS — R17 Unspecified jaundice: Secondary | ICD-10-CM | POA: Diagnosis not present

## 2015-10-24 LAB — HEPATIC FUNCTION PANEL
ALK PHOS: 53 U/L (ref 39–117)
ALT: 25 U/L (ref 0–53)
AST: 22 U/L (ref 0–37)
Albumin: 4.2 g/dL (ref 3.5–5.2)
BILIRUBIN DIRECT: 0.3 mg/dL (ref 0.0–0.3)
BILIRUBIN TOTAL: 1.4 mg/dL — AB (ref 0.2–1.2)
TOTAL PROTEIN: 6.5 g/dL (ref 6.0–8.3)

## 2015-10-28 ENCOUNTER — Telehealth: Payer: Self-pay | Admitting: Acute Care

## 2015-10-28 NOTE — Telephone Encounter (Signed)
Spoke with pt and advised of Sarah's recommendations. Pt verbalized understanding.  Nothing further is needed.

## 2015-10-28 NOTE — Telephone Encounter (Signed)
Please let him know if he is no longer having GI symptoms and does not feel he needs the GI consult he does not need to go. Please let him know if he has any further problems to let us know. Thank you.

## 2015-10-28 NOTE — Telephone Encounter (Signed)
Bilirubin repeated 10/24/15 (results in Epic).  Pt would like to know if he needs to keep GI appt since he is no longer having GI symptoms .  Please advise.

## 2015-11-26 ENCOUNTER — Other Ambulatory Visit: Payer: Self-pay | Admitting: Cardiology

## 2015-12-03 ENCOUNTER — Other Ambulatory Visit (INDEPENDENT_AMBULATORY_CARE_PROVIDER_SITE_OTHER): Payer: Medicare Other

## 2015-12-03 ENCOUNTER — Ambulatory Visit (INDEPENDENT_AMBULATORY_CARE_PROVIDER_SITE_OTHER): Payer: Medicare Other | Admitting: Gastroenterology

## 2015-12-03 ENCOUNTER — Encounter: Payer: Self-pay | Admitting: Gastroenterology

## 2015-12-03 VITALS — BP 116/60 | HR 72 | Ht 70.0 in | Wt 192.0 lb

## 2015-12-03 DIAGNOSIS — R7989 Other specified abnormal findings of blood chemistry: Secondary | ICD-10-CM | POA: Diagnosis not present

## 2015-12-03 DIAGNOSIS — R945 Abnormal results of liver function studies: Principal | ICD-10-CM

## 2015-12-03 LAB — HEPATIC FUNCTION PANEL
ALBUMIN: 4.4 g/dL (ref 3.5–5.2)
ALT: 21 U/L (ref 0–53)
AST: 20 U/L (ref 0–37)
Alkaline Phosphatase: 54 U/L (ref 39–117)
Bilirubin, Direct: 0.3 mg/dL (ref 0.0–0.3)
Total Bilirubin: 1.3 mg/dL — ABNORMAL HIGH (ref 0.2–1.2)
Total Protein: 6.7 g/dL (ref 6.0–8.3)

## 2015-12-03 NOTE — Patient Instructions (Addendum)
You likely have Gilbert's syndrome (disorder of bilirubin metabolism). You will have labs checked today in the basement lab.  Please head down after you check out with the front desk  (LFTs). You will be set up for an ultrasound (for elevated bilirubin).  You have been scheduled for an abdominal ultrasound at [facility] (1st floor of hospital) on 12-05-2015 at 8:30am. Please arrive 15 minutes prior to your appointment for registration. Make certain not to have anything to eat or drink 6 hours prior to your appointment. Should you need to reschedule your appointment, please contact radiology at [phone]. This test typically takes about 30 minutes to perform.  Your physician has requested that you go to the basement for the following lab work before leaving today:

## 2015-12-03 NOTE — Progress Notes (Signed)
HPI: This is a    very pleasant 82 year old man  who was referred to me by Noralee Space, MD  to evaluate  elevated liver tests .    Chief complaint is elevated liver tests  Was in hospital recently with chest tightness, indigestion; spent 2 nights in hosp for cardiac evaluation which was all negative.  Indigestion completely resolved.    He normaly takes pepcid once daily.  Avoid spicy foods.  Overall his weight is up a bit.  No FH of liver disease.  No personal history of liver disease.  Has 2 cocktails 2-3 times per week.  His total bilirubin has been elevated for at least 8 years. In 2009 his bilirubin was 2.0. It has bounced between 1.4 and 1.8 since then.  His direct bilirubin has never been elevated. Liver tests have otherwise all been normal. His platelets have been low for the same time period (140s in 2010, 80-100 more recently)  He had an abdominal ultrasound in 2009 for "abdominal discomfort". This showed normal liver, + gallstones in the gallbladder.  Review of systems: Pertinent positive and negative review of systems were noted in the above HPI section. Complete review of systems was performed and was otherwise normal.   Past Medical History  Diagnosis Date  . CAD (coronary artery disease)     NSTEMI 5/10. Rotational atherectomy/PCI w Xience DES x 3 to RCA and rotation atherectomy /PCI w Xience DES to prox LAD  . Abdominal pain, unspecified site   . Allergic rhinitis, cause unspecified   . Diverticulosis of colon (without mention of hemorrhage)     last colon 11/05 by DrSamLeB w divertics only  . Irritable bowel syndrome   . Elevated prostate specific antigen (PSA)     followed by Dr Terance Hart for urology  . Acquired cyst of kidney   . Osteoarthrosis, unspecified whether generalized or localized, unspecified site   . Lumbago   . Migraine, unspecified, without mention of intractable migraine without mention of status migrainosus   . Hypertension     ACEI cough   . GERD (gastroesophageal reflux disease)     esophagitis  . Hyperlipidemia   . Ischemic cardiomyopathy     mild echo (8/10) w EF 45-50%, diffuse hypokinesis, mild AI and mild MR    Past Surgical History  Procedure Laterality Date  . Rotator cuff surgery      Dr. Shellia Carwin  . Bilat inguinal hernia repairs  7/06    Dr Hassell Done  . Angioplasty    . Cardiac catheterization    . Coronary angioplasty      Current Outpatient Prescriptions  Medication Sig Dispense Refill  . albuterol (PROVENTIL HFA;VENTOLIN HFA) 108 (90 BASE) MCG/ACT inhaler Inhale 2 puffs into the lungs every 6 (six) hours as needed for wheezing or shortness of breath. 1 Inhaler 2  . alfuzosin (UROXATRAL) 10 MG 24 hr tablet Take 10 mg by mouth daily.      Marland Kitchen ALPRAZolam (XANAX) 0.5 MG tablet Take 1/2 to 1 tablet by mouth three times daily as needed for nerves 90 tablet 5  . amLODipine (NORVASC) 10 MG tablet Take 1 tablet (10 mg total) by mouth daily. 30 tablet 11  . aspirin (ASPIR-LOW) 81 MG EC tablet Take 162 mg by mouth daily.     Marland Kitchen BYSTOLIC 5 MG tablet Take 2.5 mg by mouth daily.  0  . cetirizine (ZYRTEC ALLERGY) 10 MG tablet Take 10 mg by mouth as needed.      Marland Kitchen  clopidogrel (PLAVIX) 75 MG tablet take 1 tablet by mouth once daily 30 tablet 5  . diphenhydrAMINE (BENADRYL) 25 MG tablet Take 25 mg by mouth as needed for itching.     . famotidine (PEPCID) 20 MG tablet Take 20 mg by mouth 2 (two) times daily.     . finasteride (PROSCAR) 5 MG tablet Take 5 mg by mouth daily.      Marland Kitchen losartan (COZAAR) 50 MG tablet take 1 tablet by mouth twice a day 180 tablet 1  . Multiple Vitamins-Minerals (MULTIVITAL) tablet Take 1 tablet by mouth daily.      . nitroGLYCERIN (NITROSTAT) 0.4 MG SL tablet DISSOLVE 1 TABLET UNDER THE TONGUE IF NEEDED AS DIRECTED. 25 tablet 3  . Omega-3 Fatty Acids (FISH OIL) 1000 MG CAPS Take 1 capsule by mouth daily.     . pramipexole (MIRAPEX) 0.5 MG tablet take 2-3 tablet by mouth once daily at bedtime 90  tablet 3  . rosuvastatin (CRESTOR) 40 MG tablet take 1 tablet by mouth once daily 90 tablet 2  . timolol (TIMOPTIC-XR) 0.5 % ophthalmic gel-forming Place 1 drop into both eyes every morning.  0  . TRAVATAN Z 0.004 % SOLN ophthalmic solution Place 1 drop into both eyes every evening.  0   No current facility-administered medications for this visit.    Allergies as of 12/03/2015 - Review Complete 12/03/2015  Allergen Reaction Noted  . Amoxicillin-pot clavulanate    . Codeine    . Morphine      Family History  Problem Relation Age of Onset  . Heart disease Father   . Cancer      ?? not sure what kind  . Cancer Mother     Social History   Social History  . Marital Status: Widowed    Spouse Name: Jamari Decaprio)  . Number of Children: 2  . Years of Education: N/A   Occupational History  . Optometrist   . previously worked for [employer] History Main Topics  . Smoking status: Former Smoker    Quit date: 06/22/1961  . Smokeless tobacco: Never Used  . Alcohol Use: Yes     Comment: 4 drinks per week   . Drug Use: No  . Sexual Activity: Not on file   Other Topics Concern  . Not on file   Social History Narrative   Quit smoking 1963. Exercises. Caffeine- 2 cups per day. Previously worked for [employer], now Optometrist.      Physical Exam: BP 116/60 mmHg  Pulse 72  Ht 5\' 10"  (1.778 m)  Wt 192 lb (87.091 kg)  BMI 27.55 kg/m2 Constitutional: generally well-appearing Psychiatric: alert and oriented x3 Eyes: extraocular movements intact Mouth: oral pharynx moist, no lesions Neck: supple no lymphadenopathy Cardiovascular: heart regular rate and rhythm Lungs: clear to auscultation bilaterally Abdomen: soft, nontender, nondistended, no obvious ascites, no peritoneal signs, normal bowel sounds Extremities: no lower extremity edema bilaterally Skin: no lesions on visible extremities   Assessment and plan: 82 y.o. male with  Chronically elevated  total bilirubin, normal direct bilirubin  I think he has gilbert's syndrome; I explained to him that this is a completely benign disorder of bilirubin metabolism. I did recommend I repeat his liver tests today and that we proceed with abdominal ultrasound get a good morphologic picture of his liver, bile ducts. Cirrhosis can also cause a similar picture in bilirubin and since his platelets were slightly low over the past decade or so  suspect it is possible he has mild cirrhosis however ultrasound 8 years ago was essentially normal.   Owens Loffler, MD [facility] 12/03/2015, 8:26 AM  Cc: Noralee Space, MD

## 2015-12-05 ENCOUNTER — Ambulatory Visit (HOSPITAL_COMMUNITY)
Admission: RE | Admit: 2015-12-05 | Discharge: 2015-12-05 | Disposition: A | Discharge status: Home / Self Care | Payer: Medicare Other | Source: Ambulatory Visit | Attending: Gastroenterology | Admitting: Gastroenterology

## 2015-12-05 DIAGNOSIS — K8020 Calculus of gallbladder without cholecystitis without obstruction: Secondary | ICD-10-CM | POA: Insufficient documentation

## 2015-12-05 DIAGNOSIS — R7989 Other specified abnormal findings of blood chemistry: Secondary | ICD-10-CM | POA: Insufficient documentation

## 2015-12-05 DIAGNOSIS — N281 Cyst of kidney, acquired: Secondary | ICD-10-CM | POA: Insufficient documentation

## 2015-12-05 DIAGNOSIS — R945 Abnormal results of liver function studies: Secondary | ICD-10-CM

## 2015-12-05 DIAGNOSIS — R932 Abnormal findings on diagnostic imaging of liver and biliary tract: Secondary | ICD-10-CM | POA: Insufficient documentation

## 2015-12-18 ENCOUNTER — Telehealth: Payer: Self-pay | Admitting: Cardiology

## 2015-12-18 ENCOUNTER — Telehealth: Payer: Self-pay | Admitting: Pulmonary Disease

## 2015-12-18 MED ORDER — PRAMIPEXOLE DIHYDROCHLORIDE 0.5 MG PO TABS: ORAL_TABLET | ORAL | Status: DC

## 2015-12-18 NOTE — Telephone Encounter (Signed)
Forwarded to Dr. Nelva Bush' office at [phone].

## 2015-12-18 NOTE — Telephone Encounter (Signed)
New message       Request for surgical clearance:  1. What type of surgery is being performed? injecton back  2. When is this surgery scheduled? July 19 th  3. Are there any medications that need to be held prior to surgery and how long?how long to be plavix for 5 days prior to procedure  4. Name of physician performing surgery? Dr. Nelva Bush  5. What is your office phone and fax number? [phone] office number/fax number [phone]   Cardiac clearence

## 2015-12-18 NOTE — Telephone Encounter (Signed)
Called spoke with pt. He needs refill on mirapex. This has been sent in. Nothing further needed

## 2015-12-18 NOTE — Telephone Encounter (Signed)
OK to hold Plavix for procedure. 

## 2015-12-24 ENCOUNTER — Other Ambulatory Visit: Payer: Self-pay | Admitting: Cardiology

## 2015-12-26 DIAGNOSIS — N401 Enlarged prostate with lower urinary tract symptoms: Secondary | ICD-10-CM | POA: Diagnosis not present

## 2015-12-26 DIAGNOSIS — R3121 Asymptomatic microscopic hematuria: Secondary | ICD-10-CM | POA: Diagnosis not present

## 2015-12-26 DIAGNOSIS — R972 Elevated prostate specific antigen [PSA]: Secondary | ICD-10-CM | POA: Diagnosis not present

## 2016-01-01 ENCOUNTER — Ambulatory Visit (INDEPENDENT_AMBULATORY_CARE_PROVIDER_SITE_OTHER): Payer: Medicare Other | Admitting: Cardiology

## 2016-01-01 ENCOUNTER — Encounter: Payer: Self-pay | Admitting: Cardiology

## 2016-01-01 ENCOUNTER — Encounter (INDEPENDENT_AMBULATORY_CARE_PROVIDER_SITE_OTHER): Payer: Self-pay

## 2016-01-01 VITALS — BP 132/62 | HR 72 | Ht 70.0 in | Wt 196.0 lb

## 2016-01-01 DIAGNOSIS — R0602 Shortness of breath: Secondary | ICD-10-CM

## 2016-01-01 DIAGNOSIS — I10 Essential (primary) hypertension: Secondary | ICD-10-CM

## 2016-01-01 DIAGNOSIS — I2510 Atherosclerotic heart disease of native coronary artery without angina pectoris: Secondary | ICD-10-CM | POA: Diagnosis not present

## 2016-01-01 NOTE — Patient Instructions (Signed)
Medication Instructions:  Your physician recommends that you continue on your current medications as directed. Please refer to the Current Medication list given to you today.   Labwork: None   Testing/Procedures: none  Follow-Up: Your physician wants you to follow-up in: 6 months with Dr Aundra Dubin. (January 2018). You will receive a reminder letter in the mail two months in advance. If you don't receive a letter, please call our office to schedule the follow-up appointment.        If you need a refill on your cardiac medications before your next appointment, please call your pharmacy.

## 2016-01-02 NOTE — Progress Notes (Signed)
Patient ID: Willie Lynch, male   DOB: 11-16-1933, 82 y.o.   MRN: [id number] PCP: Dr. Lenna Gilford  82 yo with NSTEMI in 5/10 s/p PCI to LAD and RCA.  In 2012, he had trouble with exertional dyspnea.  He did an ETT-myoview in 6/12 showing no ischemia or infarction.  Echo showed EF 45-50% (stable from prior).  Dyspnea improved for a while then worsened again. This time, he had a repeat cath (8/12).  This showed nonobstructive disease with patent stents.  BNP has not been elevated.  PFTs in 10/12 were normal.  I had him stop Coreg and start nebivolol instead with the thought that dyspnea could be related to beta-2 blockade and use of a more selective beta-1 blocker may help.  This seemed to help his dyspnea to a certain extent.   He developed atypical chest pain in 4/17 and was admitted overnight.  Lexiscan Cardiolite showed no ischemia or infarction.  Symptoms were probably GI-related.   I had him try stopping Bystolic to see if this would help his chronic dyspnea.  It made no difference so he restarted the Bystolic.  Interestingly, his dyspnea has resolved for the most part. He says that it is usually worse in the spring.  No chest pain. Main complaint is low back pain. He has not been able to play golf x 4 weeks because of the pain.   Labs (3/11): K 4.2, creatinine 0.7  Labs (4/11): LFTs nromal, LDL 43, HDL 35  Labs (10/11): HDL 34, LDL 58, LFTs normal  Labs (11/11): K 4.1, creatinine 0.7  Labs (6/12): K 4.1, creatinine 0.8, TSH normal, BNP 19 Labs (7/12): LDL 64, HDL 46 Labs (8/12): K 3.9, creatinine 0.8 Labs (2/13): LDL 41, HDL 23 Labs (8/13): LDL 45, HDL 42, K 3.9, creatinine 0.7 Labs (1/15): K 3.9, creatinine 1.0, LDL 69, HDL 31, HCT 40.7 Labs (1/16): K 4.2, creatinine 0.84, HCT 45, LDL 60, HDL 49 Labs (7/16): BNP 63, K 3.8, creatinine 0.79, HCT 42.6 Labs (4/17): LDL 46 Labs (6/17): LFTs normal  Allergies:  1) ! Codeine  2) ! Morphine  3) ! Augmentin (Amoxicillin-Pot Clavulanate)   Past  Medical History:  1. CAD: NSTEMI 5/10. Rotational atherectomy/PCI with Xience DES x 3 to RCA and rotational atherectomy/PCI with Xience DES to proximal LAD. ETT-myoview (6/12): 7:33, no ischemic ECG changes, EF 59%, no ischemia or infarction on perfusion images.  Left heart cath (8/12): 60-70% distal CFX, 40% in-stent restenosis in proximal LAD.   - Lexiscan Cardiolite (4/17) with EF 60%, no scar or ischemia.  2. Hyperlipidemia 3. ALLERGIC RHINITIS  4. DIVERTICULOSIS OF COLON - last colon 11/05 w/ divertics only.  5. IRRITABLE BOWEL SYNDROME 6. Hx of ELEVATED PROSTATE SPECIFIC ANTIGEN - followed by Dr Terance Hart for urology  7. Hx of RENAL CYST  8. OSTEOARTHRITIS   9. LOW BACK PAIN, CHRONIC   10. Hx of MIGRAINE HEADACHE  11. HTN: ACEI cough.  12. Esophagitis/GERD  13. Ischemic cardiomyopathy: Mild. Echo (8/10) with EF 45-50%, diffuse hypokinesis, mild AI and mild MR.  Echo (6/12): EF 45-50%, basal to mid inferoposterior hypokinesis, mild aortic insufficiency, grade I diastolic dysfunction.  LV-gram 8/12 with EF 55%.  Echo (8/16) with EF 50-55%, mild LVH, mildly dilated RV with normal systolic function.  14. Dyspnea: PFTs normal.  Possible intolerance to Coreg.   Family History:  mother died in her early 59s from metastatic cancer, family believes it may have been colon cancer that was diagnosed in her  41s  Father deceased age 56 - heart disease  One sibling alive age 19  One sibling deceased age 66 - unknown  One sibling deceased age 32 -unknown   Social History:  Quit smoking 1963  Alcohol - 4 drinks per week  Exercises  Caffeine - 2 cups per day  Widowed 2 children  Previously worked for [employer], now Optometrist.   ROS: All systems reviewed and negative except as per HPI.    Current Outpatient Prescriptions  Medication Sig Dispense Refill  . albuterol (PROVENTIL HFA;VENTOLIN HFA) 108 (90 BASE) MCG/ACT inhaler Inhale 2 puffs into the lungs every 6 (six) hours as needed for  wheezing or shortness of breath. 1 Inhaler 2  . alfuzosin (UROXATRAL) 10 MG 24 hr tablet Take 10 mg by mouth daily.      Marland Kitchen ALPRAZolam (XANAX) 0.5 MG tablet Take 1/2 to 1 tablet by mouth three times daily as needed for nerves 90 tablet 5  . amLODipine (NORVASC) 5 MG tablet Take 5 mg by mouth daily.  0  . aspirin (ASPIR-LOW) 81 MG EC tablet Take 162 mg by mouth daily.     Marland Kitchen BYSTOLIC 5 MG tablet Take 2.5 mg by mouth daily.  0  . cetirizine (ZYRTEC ALLERGY) 10 MG tablet Take 10 mg by mouth as needed.      . clopidogrel (PLAVIX) 75 MG tablet take 1 tablet by mouth once daily 30 tablet 0  . diphenhydrAMINE (BENADRYL) 25 MG tablet Take 25 mg by mouth as needed for itching.     . famotidine (PEPCID) 20 MG tablet Take 20 mg by mouth 2 (two) times daily.     . finasteride (PROSCAR) 5 MG tablet Take 5 mg by mouth daily.      Marland Kitchen losartan (COZAAR) 50 MG tablet take 1 tablet by mouth twice a day 180 tablet 1  . Multiple Vitamins-Minerals (MULTIVITAL) tablet Take 1 tablet by mouth daily.      . nitroGLYCERIN (NITROSTAT) 0.4 MG SL tablet DISSOLVE 1 TABLET UNDER THE TONGUE IF NEEDED AS DIRECTED. 25 tablet 3  . Omega-3 Fatty Acids (FISH OIL) 1000 MG CAPS Take 1 capsule by mouth daily.     . pramipexole (MIRAPEX) 0.5 MG tablet take 2-3 tablet by mouth once daily at bedtime 90 tablet 3  . rosuvastatin (CRESTOR) 40 MG tablet take 1 tablet by mouth once daily 90 tablet 2  . timolol (TIMOPTIC-XR) 0.5 % ophthalmic gel-forming Place 1 drop into both eyes every morning.  0  . TRAVATAN Z 0.004 % SOLN ophthalmic solution Place 1 drop into both eyes every evening.  0   No current facility-administered medications for this visit.    BP 132/62 mmHg  Pulse 72  Ht 5\' 10"  (1.778 m)  Wt 196 lb (88.905 kg)  BMI 28.12 kg/m2 General: NAD Neck: No JVD, no thyromegaly or thyroid nodule.  Lungs: Clear to auscultation bilaterally with normal respiratory effort. CV: Nondisplaced PMI.  Heart regular S1/S2, no S3/S4, no murmur.   No peripheral edema.  No carotid bruit.  Normal pedal pulses.  Abdomen: Soft, nontender, no hepatosplenomegaly, no distention.  Neurologic: Alert and oriented x 3.  Psych: Normal affect. Extremities: No clubbing or cyanosis.   Assessment/Plan  CORONARY ATHEROSCLEROSIS  No obstructive coronary disease on cath 8/12. Continue ASA 81, beta blocker, Crestor, losartan.  Given multiple stents (DES) will continue Plavix.  He has had no bleeding problems. Lexiscan Cardiolite in 4/17 showed on ischemia or infarction.  Dyspnea  Echo  in 8/16 was stable with EF 50-55%.  He has had chronic dyspnea.  Stopping Bystolic did not help.  Interestingly, the dyspnea has considerably improved with no intervention.  It was worst in the spring, I wonder if it could be allergy-related.   - With improvement in symptoms, can hold off on CPX.    HYPERCHOLESTEROLEMIA Good lipids 4/17.   HTN BP appears controlled.   Loralie Champagne 01/02/2016

## 2016-01-08 DIAGNOSIS — M5136 Other intervertebral disc degeneration, lumbar region: Secondary | ICD-10-CM | POA: Diagnosis not present

## 2016-01-19 ENCOUNTER — Other Ambulatory Visit: Payer: Self-pay | Admitting: Cardiology

## 2016-01-30 ENCOUNTER — Other Ambulatory Visit: Payer: Self-pay | Admitting: Cardiology

## 2016-01-31 DIAGNOSIS — M7542 Impingement syndrome of left shoulder: Secondary | ICD-10-CM | POA: Diagnosis not present

## 2016-01-31 DIAGNOSIS — M25512 Pain in left shoulder: Secondary | ICD-10-CM | POA: Diagnosis not present

## 2016-02-25 ENCOUNTER — Other Ambulatory Visit: Payer: Self-pay | Admitting: Cardiology

## 2016-04-29 ENCOUNTER — Other Ambulatory Visit: Payer: Self-pay | Admitting: *Deleted

## 2016-04-29 MED ORDER — AMLODIPINE BESYLATE 10 MG PO TABS: 10.0000 mg | ORAL_TABLET | Freq: Every day | ORAL | 5 refills | Status: DC

## 2016-04-29 NOTE — Progress Notes (Signed)
SPOKE TO PT TO VERIFY AMLODIPINE DOSAGE PER RHONDA BARRETT AND TO REFILL   PT VERIFIED AMLODIPINE 10 MG ONCE A DAY LAST  PRESCRIPTION WAS FILLED IN SEPT 2017 AND HAS ONLY 7 PILLS LEFT.  PT DOSAGE, REFILL QUANTITY, AND PHARMACY WAS VERIFIED AND RX FILLED

## 2016-05-01 ENCOUNTER — Other Ambulatory Visit: Payer: Self-pay | Admitting: Cardiology

## 2016-05-01 NOTE — Telephone Encounter (Signed)
This medication was stopped by Dr Aundra Dubin on 09/20/15. It was put back on his med list but I wanted to verify if he is supposed to be back on it. Please advise. Thanks, MI

## 2016-05-01 NOTE — Telephone Encounter (Signed)
It looks like he was discharged from the hospital [id number]  on Bystolic 2.5mg  daily.

## 2016-05-21 ENCOUNTER — Other Ambulatory Visit (INDEPENDENT_AMBULATORY_CARE_PROVIDER_SITE_OTHER): Payer: Medicare Other

## 2016-05-21 ENCOUNTER — Ambulatory Visit (INDEPENDENT_AMBULATORY_CARE_PROVIDER_SITE_OTHER)
Admission: RE | Admit: 2016-05-21 | Discharge: 2016-05-21 | Disposition: A | Discharge status: Home / Self Care | Payer: Medicare Other | Source: Ambulatory Visit | Attending: Pulmonary Disease | Admitting: Pulmonary Disease

## 2016-05-21 ENCOUNTER — Ambulatory Visit (INDEPENDENT_AMBULATORY_CARE_PROVIDER_SITE_OTHER): Payer: Medicare Other | Admitting: Pulmonary Disease

## 2016-05-21 ENCOUNTER — Encounter: Payer: Self-pay | Admitting: Pulmonary Disease

## 2016-05-21 VITALS — BP 110/70 | HR 63 | Temp 97.1°F | Ht 70.0 in | Wt 199.5 lb

## 2016-05-21 DIAGNOSIS — K219 Gastro-esophageal reflux disease without esophagitis: Secondary | ICD-10-CM

## 2016-05-21 DIAGNOSIS — M159 Polyosteoarthritis, unspecified: Secondary | ICD-10-CM

## 2016-05-21 DIAGNOSIS — G2581 Restless legs syndrome: Secondary | ICD-10-CM

## 2016-05-21 DIAGNOSIS — M545 Low back pain, unspecified: Secondary | ICD-10-CM

## 2016-05-21 DIAGNOSIS — I10 Essential (primary) hypertension: Secondary | ICD-10-CM | POA: Diagnosis not present

## 2016-05-21 DIAGNOSIS — G8929 Other chronic pain: Secondary | ICD-10-CM

## 2016-05-21 DIAGNOSIS — N401 Enlarged prostate with lower urinary tract symptoms: Secondary | ICD-10-CM | POA: Diagnosis not present

## 2016-05-21 DIAGNOSIS — R0609 Other forms of dyspnea: Secondary | ICD-10-CM | POA: Diagnosis not present

## 2016-05-21 DIAGNOSIS — I2510 Atherosclerotic heart disease of native coronary artery without angina pectoris: Secondary | ICD-10-CM | POA: Diagnosis not present

## 2016-05-21 DIAGNOSIS — N138 Other obstructive and reflux uropathy: Secondary | ICD-10-CM

## 2016-05-21 DIAGNOSIS — E0590 Thyrotoxicosis, unspecified without thyrotoxic crisis or storm: Secondary | ICD-10-CM

## 2016-05-21 DIAGNOSIS — E7800 Pure hypercholesterolemia, unspecified: Secondary | ICD-10-CM

## 2016-05-21 DIAGNOSIS — K8020 Calculus of gallbladder without cholecystitis without obstruction: Secondary | ICD-10-CM

## 2016-05-21 DIAGNOSIS — M150 Primary generalized (osteo)arthritis: Secondary | ICD-10-CM

## 2016-05-21 LAB — COMPREHENSIVE METABOLIC PANEL
ALBUMIN: 4.4 g/dL (ref 3.5–5.2)
ALK PHOS: 59 U/L (ref 39–117)
ALT: 23 U/L (ref 0–53)
AST: 23 U/L (ref 0–37)
BUN: 20 mg/dL (ref 6–23)
CO2: 24 mEq/L (ref 19–32)
CREATININE: 0.76 mg/dL (ref 0.40–1.50)
Calcium: 9.3 mg/dL (ref 8.4–10.5)
Chloride: 106 mEq/L (ref 96–112)
GFR: 104.66 mL/min (ref >60.00)
Glucose, Bld: 95 mg/dL (ref 70–99)
Potassium: 4.1 mEq/L (ref 3.5–5.1)
SODIUM: 138 meq/L (ref 135–145)
TOTAL PROTEIN: 6.6 g/dL (ref 6.0–8.3)
Total Bilirubin: 1.2 mg/dL (ref 0.2–1.2)

## 2016-05-21 LAB — CBC WITH DIFFERENTIAL/PLATELET
BASOS ABS: 0 10*3/uL (ref 0.0–0.1)
Basophils Relative: 0.3 % (ref 0.0–3.0)
EOS ABS: 0.1 10*3/uL (ref 0.0–0.7)
Eosinophils Relative: 1.9 % (ref 0.0–5.0)
HCT: 40.8 % (ref 39.0–52.0)
Hemoglobin: 13.9 g/dL (ref 13.0–17.0)
LYMPHS ABS: 1.7 10*3/uL (ref 0.7–4.0)
Lymphocytes Relative: 30 % (ref 12.0–46.0)
MCHC: 34 g/dL (ref 30.0–36.0)
MCV: 90.9 fl (ref 78.0–100.0)
MONO ABS: 0.6 10*3/uL (ref 0.1–1.0)
MONOS PCT: 11.5 % (ref 3.0–12.0)
NEUTROS PCT: 56.3 % (ref 43.0–77.0)
Neutro Abs: 3.1 10*3/uL (ref 1.4–7.7)
Platelets: 108 10*3/uL — ABNORMAL LOW (ref 150.0–400.0)
RBC: 4.49 Mil/uL (ref 4.22–5.81)
RDW: 14.4 % (ref 11.5–15.5)
WBC: 5.6 10*3/uL (ref 4.0–10.5)

## 2016-05-21 LAB — TSH: TSH: 3.22 u[IU]/mL (ref 0.35–4.50)

## 2016-05-21 NOTE — Patient Instructions (Signed)
Today we updated your med list in our EPIC system...    Continue your current medications the same...  We discussed your shortness of breath & decided to recheck a CXR & blood work to be sure nothing new has developed that might give [place] a clue to the problem...    If all this is NEG- then I would like to consider checking a CT scan of your chest for additional evaluation...   In the meanwhile-- make an effort to avoid the "VALSALVA" maneuver that explains the dyspnea occurring when you put on your socks or tie your shoes...  Continue your vigorous treadmill & bike exercises and consider adding a little elevation to the treadmill to simulate a hill...  Continue to be as active as possible...  Call for any questions...  Let's plan a follow up visit in 45mo, sooner if needed for acute problems.Marland KitchenMarland Kitchen

## 2016-05-21 NOTE — Progress Notes (Addendum)
Subjective:    Patient ID: Willie Lynch, male    DOB: 02/13/1934, 82 y.o.   MRN: [id number]  HPI 82 y/o WM whom I last saw in 2009 for a routine follow up... He has a hx CAD- NSTEMI 5/10 w/ PCI to LAD & RCA, & followed by DrMcLean on ASA, Plavix, Losartan; and Hyperchol on Cres40... ~  SEE PREV EPIC NOTES FOR OLDER DATA >>    CT Chest 08/15/04 showed norm heart size & config, mild aortic elongation/ no aneurysm, clear lungs x mild diffuse peribronch thickening- no effusion or pneumothorax, no adenopathy, abd- neg x simple cysts in both kidneys...   FullPFT 03/23/11 showed FVC=4.26 (100), FEV1=3.17 (114%), %1sec=74, mid-flows wnl at 99% predicted; no change post bronchodil; TLC=6.40 (99%), RV=2.13 (81%), RV/TLC=33;  DLCO=83% predicted...   ~  Nov 08, 2013:  76mo ROV & Willie Lynch's wife Willie Lynch died last week in [facility], he is quite tearful but processing his loss quite appropriately; he notes incr RLS symptoms & we discussed incr in his Mirapex to 0.5mg - 1to2 tabs qhs as needed, and he requests something for his nerves- try Xanax0.5,g 1/2-1 tab Tid prn... We reviewed the following medical problems during today's office visit >>     AR> on Zyrtek; he has mild seasonal symptoms... Note: PFTs & CXR were wnl...    HBP> on [id number] & back on Bystolic5-1/2tab/d; Hx of ACE cough in past; BP= 116/66 & he denies CP, palpit, SOB, edema...    CAD, s/pNSTEMI> on ASA81 & Plavix75; he had f/u DrMcLean 1/15> Hx NSTEMI w/ PCI to LAD & RCA in 2010; Myoview 6/12 was neg for ischemia & 2DEcho showed EF=45-50% (stable, no change); repeat cath 8/12 showed patent stents and nonobstructive dis...    Hyperlipid> on Cres40 + FishOil; last FLP 1/15 showed TChol 136, TG 180, HDL 31, LDL 69    GERD> on Pepcid20Bid; he denies abd pain, n/v, c/d, blood seen...    Divertics, IBS> followed by DrJacobs and he has never had a polyp but +FamHx colon ca in his mother; f/u colon due 2015...    Hx BPH, elev PSA, ED> on  Uroxatrol10, Proscar5, Viagra100; followed by DrEskridge; seen 10/13 and stable w/ PSA reported normal per pt...    DJD, LBP> on OTC analgesics as needed; he is followed by [facility] [place] w/ shots in his back periodically which really help per pt...    RLS> on Mirapex0.25 taking 1-2 Qhs but notes incr symptoms and we will double to Mirapex0.5mg - 1-2Qhs for symptoms...    Anxiety & Insomnia> on Benedryl25 prn; he's been axious since the passing of his wife 5/15- add Xanax 0.5mg  1/2 to 1 tab tid prn... We reviewed prob list, meds, xrays and labs> see below for updates >>   EKG 1/15 showed NSR, rate71, wnl, NAD...   LABS 1/15:  FLP- ok on Cres40 but TG=180 & HDL=31, advised low fat diet & incr exercise; Chems- wnl;  CBC- wnl...  ~  June 13, 2015:  14mo [name] continues his regular f/u w/ [facility]- DrEskridge, they do his labs... He presents w/ 2 main concerns>  1) RLS- on Mirapex0.5-2Qhs which helps about 60% of the time, the other 40% he can't sleep; we discussed trying to increase the Mirapex0.5 to 3 tabs Qhs & try taking it about 1H prior to bedtime; he will let me know how this is working for him...  2) Breathing issue- "DrMcLean said I  have asthma" prescribed AlbutHFA for prn use & this helps a little he says; he is c/o SOB/ DOE eg w/ stairs and golfing but denies chest tightness/ wheezing/ etc; he has a mild AM cough, no sput, no hemoptysis; he does 47min exercise 5d/wk (bike & elliptical), plus golfing;  Spirometry today is wnl & he is rec to continue the AlbutHFA prn wheezing... We reviewed the following medical problems during today's office visit >>     AR> on Zyrtek; he has mild seasonal symptoms... Note: PFTs & CXR were wnl...    HBP> on [id number], Amlod5, Bystolic5-1/2tab/d; Hx of ACE cough in past; BP= 128/70 & he denies CP, palpit, dizzy, edema; he has DOE that is surely multifactorial in nature & not progressive...    CAD, s/pNSTEMI> on ASA81 &  Plavix75; he had f/u DrMcLean 8/16 (note reviewed)> Hx NSTEMI w/ PCI to LAD & RCA in 2010; Myoview 6/12 was neg for ischemia & 2DEcho showed EF=45-50% (stable, no change); repeat cath 8/12 showed patent stents and nonobstructive dis; his dyspnea was sl better after switching Coreg to Bystolic; he remains active & the DOE seems multifactorial...    Hyperlipid> on Cres40 + FishOil; last FLP 1/16 showed TChol 125, TG 83, HDL 49, LDL 60 => much improved continue same med + diet/ exercise...    GERD> on Pepcid20Bid; he denies abd pain, n/v, c/d, blood seen... DrJacobs sent him a letter to f/u w/ him to discuss f/u colon...    Divertics, IBS> followed by DrJacobs and he has never had a polyp but +FamHx colon ca in his mother; last colonoscopy 1/05 (DrLeB) showed mod severe divertics, no polyp; he had f/u w/ DrJacobs 1/15 & they agreed to suspend f/u colons.    Hx BPH, elev PSA, ED> on Uroxatrol10, Proscar5, Viagra100; followed by DrEskridge; seen 11/15 and stable w/ PSA reported normal per pt...    DJD, LBP> on OTC analgesics as needed; he is followed by [facility] [place] w/ shots in his back periodically which really help per pt...    RLS> on Mirapex0.25 taking 1-2 Qhs but notes incr symptoms and we discussed incr Mirapex0.25 to 3tabs taken about 1H prior to bedtime & he will let me know how well this is working for him...    Anxiety & Insomnia> on Benedryl25 prn; he's been axious since the passing of his wife 5/15- add Xanax 0.5mg  1/2 to 1 tab tid prn... EXAM shows Afeb, VSS, O2sat=97% on RA at rest;  HEENT- neg, mallamapti1;  Chest- clear w/o w/r/r;  Heart- RR w/o m/r/g;  Abd- soft, nontender;  Ext- neg w/o c/c/e;  Neuro- intact...  Last CXR was 12/31/14 showing norm heart size, clear lungs/ NAD, mild DJD in Tspine...   Last 2DEcho 01/31/15 showed mild LVH, norm LVF w/ EF=50-55%, mild infer HK, Gr1DD, AoV sclerosis, mild AI, triv MR & calcif annulus, no real change from 2012...   Spirometry  06/13/15 showed FVC=4.08 (94%), FEV1=3.26 (100%), %1sec=80, mid-flows are wnl at 120% predicted=> the numbers are normal, the tracing showed some difficulty w/ the testing procedure.  LABS in Epic 2016 reviewed>  FLP- at goals,  Chems- wnl,  BNP=63,  CBC- wnl..dermatitis-dimer- neg...  IMP/PLAN>>  Sharif's dyspnea appears multifactorial and non-progressive, he is able to exercise 5d/wk & play golf w/o problems; OK to use the AlbutHFA prn for wheezing & continue f/u w/ [facility]/ DrMcLean;  His RLS may respond to an incr in his Mirapex0.25- 3Qhs & takeit about 1H prior  to sleep;  We reviewed his numerous medcal issues as above... ROV 86yr, sooner if needed prn...   ~  June 27, 2015:  2wk ROV & add-on appt requested for fall w/ skin tear>  Willie Lynch was just here 2wks ago for a 81mo ROV (see above); he fell down some steps at home (tripped- did not hit head) w/ skin tear on left forearm; he did not go to ER- friend cleaned & dressed it daily using peroxide & neosporin, non-stick pad & paper tape;  Exam shows an L-shaped skin tear but it all looks clean, no drainage, no erythema, no purulence, and the skin appears to have attached itself nicely w/ the current dresssing regimen... We inspected the wound, cleaned it again & dressed it w/ Telfa, gauze roll, & paper tape; pt givn instructions for home care & asked to call for any problems or questions...  ADDENDUM>>  He was [facility] 4/4 - 09/25/15 by [facility] w/ CP => DCSummary reviewed...  ~  May 21, 2016:  44mo ROV & pulm/medical follow up visit>  Willie Lynch presents w/ c/o "breathing is worse & time for a check up";  He continues to note SOB/DOE w/ stairs & "putting on my socks and shoes" but notes that he exercises at a gym 5d/wk on treadmill & bike without difficulty- he notes heart rate up into the 90s only & states he has no prob w/ this degree of activity & does not feel winded (I suggested to him that this points to the fact that he is not pushing hard enough to get  any aerobic benefit);  I told him that the only explanation that I have for the dyspnea that occurs when he ties his shoes is that he is holding his breath when he bends over & doing a valsalva maneuver- I described this & modeled it for him, then showed how to avoid the dyspnea by breathing normally or crossing his legs and NOT bending over);  He also notes that he still plays golf & does fairly well- limited by knee & back pain, not his breathing => we discussed reassessing his resp status w/ CXR, ambulatory oximetry, Labs... EPIC REVIEW> he had the same compliants in 2012 w/ eval by DrMcLean & Full PFTs were normal then, they cahnged Coreg to Bystolic & no diff there either... We reviewed the following medical problems during today's office visit >>     AR> on Zyrtek; he has mild seasonal symptoms... He has AlbutHFA for prn use but he is not wheezing & we've been unable to diagnose any airway dis; Note: PFTs & CXR were wnl; he quit smoking in the 1960s....    HBP> on [id number], [id number], Bystolic5-1/2tab/d; Hx of ACE cough in past; BP= 110/70 & he denies CP, palpit, dizzy, edema; he has DOE that is surely multifactorial in nature & intermittent over the yrs...    CAD, s/pNSTEMI> on ASA81 & Plavix75; he had f/u DrMcLean 7/17 (note reviewed)> Hx NSTEMI w/ PCI to LAD & RCA in 2010; Myoview 6/12 was neg for ischemia & 2DEcho showed EF=45-50% (stable, no change); repeat cath 8/12 showed patent stents and nonobstructive dis; his dyspnea was sl better after switching Coreg to Bystolic; he remains active & the DOE seems multifactorial...    Hyperlipid> on Cres40 + FishOil; last FLP 4/17 showed TChol 108, TG 124, HDL 37, LDL 46 => much improved continue same med + diet/ exercise...    GERD> on Pepcid20Bid; he denies abd pain, n/v, c/d, blood  seen... DrJacobs saw him 6/17- GERD, Divertics, IBS, prob Gilbert's syndrome- AbdSonar 11/2015 showed Gallstone, prob hepatic steatosis, bilat renal cysts.    Divertics, IBS>  followed by DrJacobs and he has never had a polyp but +FamHx colon ca in his mother; last colonoscopy 1/05 (DrLeB) showed mod severe divertics, no polyp; he had f/u w/ DrJacobs 1/15 & they agreed to suspend f/u colons.    Hx BPH, elev PSA, bilat renal cysts, ED> on Uroxatrol10, Proscar5, Viagra100; followed by DrEskridge; seen 7/17 and stable w/ PSA reported normal per pt...    DJD, LBP> on OTC analgesics as needed; he is followed by [facility] [place] w/ shots in his back periodically which really help per pt...    RLS> on Mirapex0.25 taking 1-2 Qhs but notes incr symptoms and we discussed incr Mirapex0.25 to 3tabs taken about 1H prior to bedtime & he will let me know how well this is working for him...    Anxiety & Insomnia> on Benedryl25 prn; he's been axious since the passing of his wife 5/15- add Xanax 0.5mg  1/2 to 1 tab tid prn...    Thrombocytopenia>  He has had chronically low plat counts but no bleeding diathesis even on his ASA/ Plavix; Plat ct ~100K in 2010, and 85K-108K since then w/ CBC 05/21/16 wnl x Plat= 108K: ?etiology, poss related to FLD w/ steatosis on Sonar, norm LFTs & no signs of cirrhosis... EXAM shows Afeb, VSS, O2sat=97% on RA at rest;  HEENT- neg, mallamapti1;  Chest- clear w/o w/r/r;  Heart- RR w/o m/r/g;  Abd- soft, nontender;  Ext- neg w/o c/c/e;  Neuro- intact...  CXR 05/21/16>  Borderline heart size, atherosclerotic Ao, clear lungs w/ min basilar scarring- NAD...   Ambulatory Oximetery 05/21/16>  O2sat=94% on RA at rest w/ pulse=67/min;  He ambulated 3 Laps (185'ea) w/ lowest O2sat=92% w/ pulse=90/min...  LABS 05/21/16>  Chems- wnl;  CBC- ok x plat=108K;  TSH=3.22... IMP/PLAN>>  Willie Lynch is quite concerned about this dyspnea as he perceives that it is worse than last yr but has really been a complaint for many yrs- as noted he exercises 5d/wk at gym on treadmill & bike but only get HR up into the 90s & not pushing very hard; CXR is clear, no signif desat w/  ambulation, labs are OK x mild thrombocytopenia (chr issue); prev spirometry was wnl & he has no hx lung dis w/ neg CT Chest in 2006;  I have recommended a CT ANGIO CHEST for completeness, and if this is ok then ask DrMcLean for recheck & to see if he can get him into a cardiac rehab program to advance his exercise tolerance...   ADDENDUM>>  CT Angio CHEST 05/26/16 showed NEG for PTE, +thoracic aortic calcif & LAD/ RCA calcif w/ stent; no adenopathy, sm HH, tiny 26mm right lung nodule (unchanged from 2006); tiny gallstones, renal cyst, DJD...           Problem List:   GLAUCOMA>  On eye drops from [place]...  ALLERGIC RHINITIS (ICD-477.9) - on ZYRTEK 10mg  prn, Benedryl prn... ~  CT Chest 08/15/04 showed norm heart size & config, mild aortic elongation/ no aneurysm, clear lungs x mild diffuse peribronch thickening- no effusion or pneumothorax, no adenopathy, abd- neg x simple cysts in both kidneys...  ~  CXR 8/12 showed borderline heart size, ectasia & calcif thorAo; clear lungs, mild degen spondylosis, prev right shoulder surg... ~  FullPFT 03/23/11 showed FVC=4.26 (100), FEV1=3.17 (114%), %1sec=74, mid-flows wnl at 99%  predicted; no change post bronchodil; TLC=6.40 (99%), RV=2.13 (81%), RV/TLC=33;  DLCO=83% predicted...  ~  Last CXR was 12/31/14 showing norm heart size, clear lungs/ NAD, mild DJD in Tspine...  ~  Spirometry 06/13/15 showed FVC=4.08 (94%), FEV1=3.26 (100%), %1sec=80, mid-flows are wnl at 120% predicted=> the numbers are normal, the tracing showed some difficulty w/ the testing procedure.  Hx HBP w/ ACE cough>  On LOSARTAN 50mg /d & COREG 6.25mg  Bid...  BP= 120/68 & he denies CP or palpit, but as noted is c/o no energy/ feeling poorly/ "indigestion" etc. ~  CXR 5/10 showed tort thor Ao, clear lungs, post op changes right shoulder ~  2DEcho 6/12 showed norm wall thickness, reduced EF=45-50% w/ HK basal, inferior, lateral walls; Gr1DD, mild AI.Marland Kitchen. ~  5/15:  on Losartan25 & back on  Bystolic5-1/2tab/d; Hx of ACE cough in past; BP= 116/66 & he denies CP, palpit, SOB, edema.. ~  Last 2DEcho 01/31/15 showed mild LVH, norm LVF w/ EF=50-55%, mild infer HK, Gr1DD, AoV sclerosis, mild AI, triv MR & calcif annulus, no real change from 2012...   CAD/ NSTEMI 5/10 w/ PCI LAD & RCA>  Followed regularly by DrMcLean for Cards on ASA 81mg /d, PLAVIX 75mg /d, plus the Coreg, Losartan, Crestor, NTG prn. Ischemic Cardiomyopathy w/ diffuse HK on Echo 8/10 & EF= 45-50%, mild AI/ MR... ~  Hosp 5/10 by DrMcLean/ Cards> CAD & NSTEMI w/ Rotational atherectomy/PCI with Xience DES x 3 to RCA and rotational atherectomy/PCI with Xience DES to proximal LAD.  ~  EKG 6/12 showed SBrady, rate 58/min, otherw WNL... ~  1/14: Cards f/u by DrMcLean> hx NSTEMI 5/10 w/ PCI to LAD & RCA; c/o exertional dyspnea> ETT-Myoview 6/12 was neg w/o ischemia or infarction; 2DEcho 6/12 w/ EF=45-50% (no ch from prev); repeat cath 8/12 showed nonobstructive dis w/ patent stents; Dyspnea ultimately believed due to BBlockers as he had difficulty w/ Coreg & Bystolic- better off these meds... EKG showed SBrady, rate56, wnl, NAD.Marland Kitchen. ~  5/15: on ASA81 & Plavix75; he had f/u DrMcLean 1/15> Hx NSTEMI w/ PCI to LAD & RCA in 2010; Myoview 6/12 was neg for ischemia & 2DEcho showed EF=45-50% (stable, no change); repeat cath 8/12 showed patent stents and nonobstructive dis. ~   8/16: f/u DrMcLean (note reviewed)> Hx NSTEMI w/ PCI to LAD & RCA in 2010; Myoview 6/12 was neg for ischemia & 2DEcho showed EF=45-50% (stable, no change); repeat cath 8/12 showed patent stents and nonobstructive dis; his dyspnea was sl better after switching Coreg to Bystolic; he remains active & the DOE seems multifactorial.  HYPERLIPIDEMIA>  On diet + CRESTOR 40mg /d and FISH OIL 1000mg  Bid... ~  FLP 10/11 on Cres40 showed TChol 112, TG 100, HDL 34, LDL 58 ~  FLP 7/12 on Cres40 showed TChol 123, TG 68, HDL 46, LDL 64 ~  FLP 8/13 on Cres40 showed TChol 108, TG 104, HDL 42,  LDL 45  ~  FLP 1/15 on Cres40 showed TChol 136, TG 180, HDL 31, LDL 69  ~  FLP 1/16 on Cres40 showed TChol 125, TG 83, HDL 49, LDL 60  GERD/ Esophagitis>  On PEPCID which he takes Bid & not on PPI due to his Plavix rx... ~  Labs 6/12 showed Hg= 14.7, MCV= 93... ~  CBC has remained wnl...   DIVERTICULOSIS OF COLON (ICD-562.10) - last colon 11/05 by DrSamLeB w/ divertics only... He saw DrJacobs 10/11 & they decided on f/u colon in 2015 despite family hx of colon cancer in mother since he  has never had a polyp etc... Hx of IRRITABLE BOWEL SYNDROME (ICD-564.1) - he denies recent abd pain, n/v, change in bowels, etc... ~  1/15: he saw DrJacobs> divertics, neg colon 2005, on Plavix- they decided to forgo any further colon cancer screening...   Hx of ELEVATED PROSTATE SPECIFIC ANTIGEN (ICD-790.93) - followed by DrPeterson for Urology on UROXATRAL 10mg /d, & PROSCAR 5mg /d;  He tells me that DrPeterson follows his PSAs, we do not have any recent notes from Urology. Hx of RENAL CYST (ICD-593.2) ~  10/13: he had f/u DrEskridge> BPH w/ BOO, elev PSA, ED; stable on [place]; he tells me his PSA was "fine"...  OSTEOARTHRITIS (ICD-715.90) LOW BACK PAIN, CHRONIC (ICD-724.2) - severe back discomfort w/ eval by ortho (DrOlin & Ramos) resulting in epid steroid shots, a round of prednisone, and percocet + advil for pain...   Hx of MIGRAINE HEADACHE (ICD-346.90) - he denies recent migraine problem...  INSOMNIA>  On BENEDRYL 25mg  prn use...  HEALTH MAINTENANCE: ~  GI:  Dr Ardis Hughs follows & f/u colon due 2015... ~  GU:  Followed by DrPeterson & he does his PSAs... ~  Immunizations:  He received Shingles vaccine in 2011   Past Medical History:  Diagnosis Date  . Abdominal pain, unspecified site   . Acquired cyst of kidney   . Allergic rhinitis, cause unspecified   . CAD (coronary artery disease)    NSTEMI 5/10. Rotational atherectomy/PCI w Xience DES x 3 to RCA and rotation atherectomy /PCI w  Xience DES to prox LAD  . Diverticulosis of colon (without mention of hemorrhage)    last colon 11/05 by DrSamLeB w divertics only  . Elevated prostate specific antigen (PSA)    followed by Dr Terance Hart for urology  . GERD (gastroesophageal reflux disease)    esophagitis  . Hyperlipidemia   . Hypertension    ACEI cough  . Irritable bowel syndrome   . Ischemic cardiomyopathy    mild echo (8/10) w EF 45-50%, diffuse hypokinesis, mild AI and mild MR  . Lumbago   . Migraine, unspecified, without mention of intractable migraine without mention of status migrainosus   . Osteoarthrosis, unspecified whether generalized or localized, unspecified site     Past Surgical History:  Procedure Laterality Date  . ANGIOPLASTY    . bilat inguinal hernia repairs  7/06   Dr Hassell Done  . CARDIAC CATHETERIZATION    . CORONARY ANGIOPLASTY    . rotator cuff surgery     Dr. Shellia Carwin     Outpatient Encounter Prescriptions as of 05/21/2016  Medication Sig Dispense Refill  . albuterol (PROVENTIL HFA;VENTOLIN HFA) 108 (90 BASE) MCG/ACT inhaler Inhale 2 puffs into the lungs every 6 (six) hours as needed for wheezing or shortness of breath. 1 Inhaler 2  . alfuzosin (UROXATRAL) 10 MG 24 hr tablet Take 10 mg by mouth daily.      Marland Kitchen ALPRAZolam (XANAX) 0.5 MG tablet Take 1/2 to 1 tablet by mouth three times daily as needed for nerves 90 tablet 5  . amLODipine (NORVASC) 10 MG tablet Take 1 tablet (10 mg total) by mouth daily. 30 tablet 5  . aspirin (ASPIR-LOW) 81 MG EC tablet Take 162 mg by mouth daily.     Marland Kitchen BYSTOLIC 5 MG tablet Take 2.5 mg by mouth daily.  0  . BYSTOLIC 5 MG tablet take 1/2 tablet by mouth once daily 15 tablet 6  . cetirizine (ZYRTEC ALLERGY) 10 MG tablet Take 10 mg by mouth as needed.      Marland Kitchen  clopidogrel (PLAVIX) 75 MG tablet take 1 tablet by mouth once daily 30 tablet 11  . diphenhydrAMINE (BENADRYL) 25 MG tablet Take 25 mg by mouth as needed for itching.     . famotidine (PEPCID) 20 MG tablet  Take 20 mg by mouth 2 (two) times daily.     . finasteride (PROSCAR) 5 MG tablet Take 5 mg by mouth daily.      Marland Kitchen losartan (COZAAR) 50 MG tablet take 1 tablet by mouth twice a day 180 tablet 3  . Multiple Vitamins-Minerals (MULTIVITAL) tablet Take 1 tablet by mouth daily.      . nitroGLYCERIN (NITROSTAT) 0.4 MG SL tablet DISSOLVE 1 TABLET UNDER THE TONGUE IF NEEDED AS DIRECTED. 25 tablet 3  . Omega-3 Fatty Acids (FISH OIL) 1000 MG CAPS Take 1 capsule by mouth daily.     . pramipexole (MIRAPEX) 0.5 MG tablet take 2-3 tablet by mouth once daily at bedtime 90 tablet 3  . rosuvastatin (CRESTOR) 40 MG tablet take 1 tablet by mouth once daily 90 tablet 2  . timolol (TIMOPTIC-XR) 0.5 % ophthalmic gel-forming Place 1 drop into both eyes every morning.  0  . TRAVATAN Z 0.004 % SOLN ophthalmic solution Place 1 drop into both eyes every evening.  0   No facility-administered encounter medications on file as of 05/21/2016.     Allergies  Allergen Reactions  . Amoxicillin-Pot Clavulanate     REACTION: pt developed HIVES on augmentin  . Codeine   . Morphine    ACE inhibitors:  Lisinopril w/ cough...     Current Medications, Allergies, Past Medical History, Past Surgical History, Family History, and Social History were reviewed in [employer] record.    Review of Systems       See HPI - all other systems neg except as noted...      The patient denies anorexia, fever, weight loss, weight gain, vision loss, decreased hearing, hoarseness, chest pain, syncope, peripheral edema, prolonged cough, hemoptysis, abdominal pain, melena, hematochezia, severe indigestion/heartburn, hematuria, incontinence, muscle weakness, suspicious skin lesions, transient blindness, difficulty walking, depression, unusual weight change, abnormal bleeding, enlarged lymph nodes, and angioedema.     Objective:   Physical Exam    WD, WN, 82 y/o WM in NAD...  GENERAL:  Alert & oriented; pleasant &  cooperative. HEENT:  [place]/AT, EOM-wnl, PERRLA, EACs-clear, TMs-wnl, NOSE-clear, THROAT-clear & wnl. NECK:  Supple w/ full ROM; no JVD; normal carotid impulses w/o bruits; no thyromegaly or nodules palpated; no lymphadenopathy. CHEST:  Clear to P & A; without wheezes/ rales/ or rhonchi. HEART:  Regular Rhythm; without murmurs/ rubs/ or gallops. ABDOMEN:  Soft & nontender; normal bowel sounds; no organomegaly or masses detected. EXT: without deformities, mild arthritic changes; no varicose veins/ venous insuffic/ or edema. NEURO:  CN's intact; motor testing normal; sensory testing normal; gait normal & balance OK. DERM:  No lesions noted; no rash etc...  RADIOLOGY DATA:  Reviewed in the EPIC EMR & discussed w/ the patient...  LABORATORY DATA:  Reviewed in the EPIC EMR & discussed w/ the patient...   Assessment & Plan:    CAD/ s/p NSTEMI 5/10 w/ PTCA & stents/ Ischemic Cardiomyopathy>  Followed by drMcLean 7 stable...  CHOL>  Stable on the Cres40...  GI Symptoms/ GERD/ IBS/ Gas>  Followed by DrJacobs, on Pepcid Bid & add Mylicon, other Simethacone containing gas therapies...  GU>  Hx elev PSA>  All followed by DrEskridge...  LBP/ DJD>  Followed by DrOlin & Ramos.Marland KitchenMarland Kitchen  RLS>  We discussed increasing the MIRAPEX0.25 to 3 tabs taken 1H prior to bedtime...  Anxiety>  On Xanax0.5mg  prn use...   Patient's Medications  New Prescriptions   No medications on file  Previous Medications   ALBUTEROL (PROVENTIL HFA;VENTOLIN HFA) 108 (90 BASE) MCG/ACT INHALER    Inhale 2 puffs into the lungs every 6 (six) hours as needed for wheezing or shortness of breath.   ALFUZOSIN (UROXATRAL) 10 MG 24 HR TABLET    Take 10 mg by mouth daily.     ALPRAZOLAM (XANAX) 0.5 MG TABLET    Take 1/2 to 1 tablet by mouth three times daily as needed for nerves   AMLODIPINE (NORVASC) 10 MG TABLET    Take 1 tablet (10 mg total) by mouth daily.   ASPIRIN (ASPIR-LOW) 81 MG EC TABLET    Take 162 mg by mouth daily.    BYSTOLIC  5 MG TABLET    Take 2.5 mg by mouth daily.   BYSTOLIC 5 MG TABLET    take 1/2 tablet by mouth once daily   CETIRIZINE (ZYRTEC ALLERGY) 10 MG TABLET    Take 10 mg by mouth as needed.     CLOPIDOGREL (PLAVIX) 75 MG TABLET    take 1 tablet by mouth once daily   DIPHENHYDRAMINE (BENADRYL) 25 MG TABLET    Take 25 mg by mouth as needed for itching.    FAMOTIDINE (PEPCID) 20 MG TABLET    Take 20 mg by mouth 2 (two) times daily.    FINASTERIDE (PROSCAR) 5 MG TABLET    Take 5 mg by mouth daily.     LOSARTAN (COZAAR) 50 MG TABLET    take 1 tablet by mouth twice a day   MULTIPLE VITAMINS-MINERALS (MULTIVITAL) TABLET    Take 1 tablet by mouth daily.     NITROGLYCERIN (NITROSTAT) 0.4 MG SL TABLET    DISSOLVE 1 TABLET UNDER THE TONGUE IF NEEDED AS DIRECTED.   OMEGA-3 FATTY ACIDS (FISH OIL) 1000 MG CAPS    Take 1 capsule by mouth daily.    PRAMIPEXOLE (MIRAPEX) 0.5 MG TABLET    take 2-3 tablet by mouth once daily at bedtime   ROSUVASTATIN (CRESTOR) 40 MG TABLET    take 1 tablet by mouth once daily   TIMOLOL (TIMOPTIC-XR) 0.5 % OPHTHALMIC GEL-FORMING    Place 1 drop into both eyes every morning.   TRAVATAN Z 0.004 % SOLN OPHTHALMIC SOLUTION    Place 1 drop into both eyes every evening.  Modified Medications   No medications on file  Discontinued Medications   No medications on file

## 2016-05-24 NOTE — Progress Notes (Signed)
Webb Silversmith, could you get Mr Christodoulou in with me in CHF clinic in 6 wks? Thanks.

## 2016-05-25 ENCOUNTER — Other Ambulatory Visit: Payer: Self-pay | Admitting: Pulmonary Disease

## 2016-05-25 DIAGNOSIS — R0600 Dyspnea, unspecified: Secondary | ICD-10-CM

## 2016-05-26 ENCOUNTER — Ambulatory Visit (INDEPENDENT_AMBULATORY_CARE_PROVIDER_SITE_OTHER)
Admission: RE | Admit: 2016-05-26 | Discharge: 2016-05-26 | Disposition: A | Discharge status: Home / Self Care | Payer: Medicare Other | Source: Ambulatory Visit | Attending: Pulmonary Disease | Admitting: Pulmonary Disease

## 2016-05-26 ENCOUNTER — Telehealth: Payer: Self-pay | Admitting: Pulmonary Disease

## 2016-05-26 DIAGNOSIS — R0600 Dyspnea, unspecified: Secondary | ICD-10-CM

## 2016-05-26 MED ORDER — IOPAMIDOL (ISOVUE-370) INJECTION 76%
80.0000 mL | Freq: Once | INTRAVENOUS | Status: AC | PRN
  Administered 2016-05-26: 80 mL via INTRAVENOUS

## 2016-05-26 NOTE — Telephone Encounter (Signed)
Noted. Will route message to Marliss Czar so that she can make SN aware.

## 2016-06-02 ENCOUNTER — Telehealth: Payer: Self-pay | Admitting: Pulmonary Disease

## 2016-06-02 ENCOUNTER — Other Ambulatory Visit: Payer: Self-pay | Admitting: Pulmonary Disease

## 2016-06-02 NOTE — Telephone Encounter (Signed)
[email or web address]

## 2016-06-03 NOTE — Telephone Encounter (Signed)
SN  Pt. Called pur office requesting you give him a call. When asked what was the reason for the call he stated because "I want to talk to him about something, and that is a good enough reason."

## 2016-06-08 NOTE — Telephone Encounter (Signed)
SN called and spoke with pt.

## 2016-06-24 ENCOUNTER — Telehealth: Payer: Self-pay | Admitting: Cardiology

## 2016-06-24 NOTE — Telephone Encounter (Signed)
Willie Lynch has some questions about Dr. Claris Gladden transition and would like for you to call him back. Thanks.

## 2016-06-25 NOTE — Telephone Encounter (Signed)
Pt states he understood from Dr Lenna Gilford that Dr Aundra Dubin would continue to see him in the Heart Failure Clinic at [facility] since Dr Aundra Dubin is not longer seeing patients in the [facility].  Pt calling to confirm this. Pt advised I will forward to Dr Aundra Dubin for review.

## 2016-06-25 NOTE — Telephone Encounter (Signed)
Yes, I will see him in CHF clinic.  Is he due for appt? If so will try to get him in in the next few weeks.

## 2016-06-26 ENCOUNTER — Telehealth (HOSPITAL_COMMUNITY): Payer: Self-pay | Admitting: Vascular Surgery

## 2016-06-26 NOTE — Telephone Encounter (Signed)
Left pt message to make pt appt in hf clinic

## 2016-06-26 NOTE — Telephone Encounter (Signed)
LMTCB

## 2016-06-29 NOTE — Telephone Encounter (Signed)
Pt aware he is scheduled to see Dr Aundra Dubin 07/24/16 in the [facility] at [facility].

## 2016-07-07 DIAGNOSIS — C44319 Basal cell carcinoma of skin of other parts of face: Secondary | ICD-10-CM | POA: Diagnosis not present

## 2016-07-07 DIAGNOSIS — L821 Other seborrheic keratosis: Secondary | ICD-10-CM | POA: Diagnosis not present

## 2016-07-07 DIAGNOSIS — D225 Melanocytic nevi of trunk: Secondary | ICD-10-CM | POA: Diagnosis not present

## 2016-07-07 DIAGNOSIS — L570 Actinic keratosis: Secondary | ICD-10-CM | POA: Diagnosis not present

## 2016-07-16 ENCOUNTER — Telehealth (HOSPITAL_COMMUNITY): Payer: Self-pay | Admitting: Cardiology

## 2016-07-16 NOTE — Telephone Encounter (Signed)
I called patient to reschedule appt for 07/24/16 with Dr. Aundra Dubin.  I left VM for pt to call back.  Will try to call patient back.

## 2016-07-17 NOTE — Telephone Encounter (Signed)
Pt called and rescheduled his appt for 08/18/16 with Dr. Aundra Dubin.

## 2016-07-24 ENCOUNTER — Ambulatory Visit (HOSPITAL_COMMUNITY): Payer: Medicare Other

## 2016-08-07 DIAGNOSIS — S8001XA Contusion of right knee, initial encounter: Secondary | ICD-10-CM | POA: Diagnosis not present

## 2016-08-07 DIAGNOSIS — M1711 Unilateral primary osteoarthritis, right knee: Secondary | ICD-10-CM | POA: Diagnosis not present

## 2016-08-07 DIAGNOSIS — S8011XA Contusion of right lower leg, initial encounter: Secondary | ICD-10-CM | POA: Diagnosis not present

## 2016-08-07 DIAGNOSIS — M19011 Primary osteoarthritis, right shoulder: Secondary | ICD-10-CM | POA: Diagnosis not present

## 2016-08-07 DIAGNOSIS — M7541 Impingement syndrome of right shoulder: Secondary | ICD-10-CM | POA: Diagnosis not present

## 2016-08-18 ENCOUNTER — Encounter (HOSPITAL_COMMUNITY): Payer: Self-pay

## 2016-08-18 ENCOUNTER — Ambulatory Visit (HOSPITAL_COMMUNITY)
Admission: RE | Admit: 2016-08-18 | Discharge: 2016-08-18 | Disposition: A | Discharge status: Home / Self Care | Payer: Medicare Other | Source: Ambulatory Visit | Attending: Internal Medicine | Admitting: Internal Medicine

## 2016-08-18 VITALS — BP 136/74 | HR 60 | Wt 197.5 lb

## 2016-08-18 DIAGNOSIS — I2510 Atherosclerotic heart disease of native coronary artery without angina pectoris: Secondary | ICD-10-CM | POA: Insufficient documentation

## 2016-08-18 DIAGNOSIS — Z79899 Other long term (current) drug therapy: Secondary | ICD-10-CM | POA: Insufficient documentation

## 2016-08-18 DIAGNOSIS — E7800 Pure hypercholesterolemia, unspecified: Secondary | ICD-10-CM | POA: Diagnosis not present

## 2016-08-18 DIAGNOSIS — Z87891 Personal history of nicotine dependence: Secondary | ICD-10-CM | POA: Insufficient documentation

## 2016-08-18 DIAGNOSIS — I10 Essential (primary) hypertension: Secondary | ICD-10-CM

## 2016-08-18 DIAGNOSIS — Z7982 Long term (current) use of aspirin: Secondary | ICD-10-CM | POA: Diagnosis not present

## 2016-08-18 DIAGNOSIS — R0602 Shortness of breath: Secondary | ICD-10-CM

## 2016-08-18 LAB — LIPID PANEL
CHOL/HDL RATIO: 2.6 ratio
Cholesterol: 129 mg/dL (ref 0–200)
HDL: 49 mg/dL (ref >40)
LDL CALC: 58 mg/dL (ref 0–99)
Triglycerides: 112 mg/dL (ref <150)
VLDL: 22 mg/dL (ref 0–40)

## 2016-08-18 NOTE — Progress Notes (Signed)
Patient ID: Willie Lynch, male   DOB: May 06, 1934, 83 y.o.   MRN: [id number] PCP: Dr. Lenna Gilford Cardiology: Dr. Aundra Dubin  83 yo with NSTEMI in 5/10 s/p PCI to LAD and RCA.  In 2012, he had trouble with exertional dyspnea.  He did an ETT-myoview in 6/12 showing no ischemia or infarction.  Echo showed EF 45-50% (stable from prior).  Dyspnea improved for a while then worsened again. This time, he had a repeat cath (8/12).  This showed nonobstructive disease with patent stents.  BNP has not been elevated.  PFTs in 10/12 were normal.  I had him stop Coreg and start nebivolol instead with the thought that dyspnea could be related to beta-2 blockade and use of a more selective beta-1 blocker may help.  This seemed to help his dyspnea to a certain extent.   He developed atypical chest pain in 4/17 and was admitted overnight.  Lexiscan Cardiolite showed no ischemia or infarction.  Symptoms were probably GI-related.   I had him try stopping Bystolic to see if this would help his chronic dyspnea.  It made no difference so he restarted the Bystolic.  His dyspnea has resolved for the most part. He walks on a treadmill or uses the elliptical for 30 minutes 5 days/week without problem. No chest pain. Has significant knee pain, has had steroid injections which have helped.   ECG (reviewed personally): NSR, normal  Labs (3/11): K 4.2, creatinine 0.7  Labs (4/11): LFTs nromal, LDL 43, HDL 35  Labs (10/11): HDL 34, LDL 58, LFTs normal  Labs (11/11): K 4.1, creatinine 0.7  Labs (6/12): K 4.1, creatinine 0.8, TSH normal, BNP 19 Labs (7/12): LDL 64, HDL 46 Labs (8/12): K 3.9, creatinine 0.8 Labs (2/13): LDL 41, HDL 23 Labs (8/13): LDL 45, HDL 42, K 3.9, creatinine 0.7 Labs (1/15): K 3.9, creatinine 1.0, LDL 69, HDL 31, HCT 40.7 Labs (1/16): K 4.2, creatinine 0.84, HCT 45, LDL 60, HDL 49 Labs (7/16): BNP 63, K 3.8, creatinine 0.79, HCT 42.6 Labs (4/17): LDL 46 Labs (6/17): LFTs normal Labs (11/17): K 4.1, creatinine  0.76, HCT 40.8, TSH normal  Allergies:  1) ! Codeine  2) ! Morphine  3) ! Augmentin (Amoxicillin-Pot Clavulanate)   Past Medical History:  1. CAD: NSTEMI 5/10. Rotational atherectomy/PCI with Xience DES x 3 to RCA and rotational atherectomy/PCI with Xience DES to proximal LAD. ETT-myoview (6/12): 7:33, no ischemic ECG changes, EF 59%, no ischemia or infarction on perfusion images.  Left heart cath (8/12): 60-70% distal CFX, 40% in-stent restenosis in proximal LAD.   - Lexiscan Cardiolite (4/17) with EF 60%, no scar or ischemia.  2. Hyperlipidemia 3. ALLERGIC RHINITIS  4. DIVERTICULOSIS OF COLON - last colon 11/05 w/ divertics only.  5. IRRITABLE BOWEL SYNDROME 6. Hx of ELEVATED PROSTATE SPECIFIC ANTIGEN - followed by Dr Terance Hart for urology  7. Hx of RENAL CYST  8. OSTEOARTHRITIS   9. LOW BACK PAIN, CHRONIC   10. Hx of MIGRAINE HEADACHE  11. HTN: ACEI cough.  12. Esophagitis/GERD  13. Ischemic cardiomyopathy: Mild. Echo (8/10) with EF 45-50%, diffuse hypokinesis, mild AI and mild MR.  Echo (6/12): EF 45-50%, basal to mid inferoposterior hypokinesis, mild aortic insufficiency, grade I diastolic dysfunction.  LV-gram 8/12 with EF 55%.  Echo (8/16) with EF 50-55%, mild LVH, mildly dilated RV with normal systolic function.  14. Dyspnea: PFTs normal.  Possible intolerance to Coreg.   Family History:  mother died in her early 70s from metastatic cancer,  family believes it may have been colon cancer that was diagnosed in her 44s  Father deceased age 59 - heart disease  One sibling alive age 37  One sibling deceased age 35 - unknown  One sibling deceased age 50 -unknown   Social History:  Quit smoking 1963  Alcohol - 4 drinks per week  Exercises  Caffeine - 2 cups per day  Widowed 2 children  Previously worked for [employer], now Optometrist.   ROS: All systems reviewed and negative except as per HPI.    Current Outpatient Prescriptions  Medication Sig Dispense Refill  .  albuterol (PROVENTIL HFA;VENTOLIN HFA) 108 (90 BASE) MCG/ACT inhaler Inhale 2 puffs into the lungs every 6 (six) hours as needed for wheezing or shortness of breath. 1 Inhaler 2  . alfuzosin (UROXATRAL) 10 MG 24 hr tablet Take 10 mg by mouth daily.      Marland Kitchen ALPRAZolam (XANAX) 0.5 MG tablet Take 1/2 to 1 tablet by mouth three times daily as needed for nerves 90 tablet 5  . amLODipine (NORVASC) 10 MG tablet Take 1 tablet (10 mg total) by mouth daily. 30 tablet 5  . aspirin (ASPIR-LOW) 81 MG EC tablet Take 162 mg by mouth daily.     Marland Kitchen BYSTOLIC 5 MG tablet take 1/2 tablet by mouth once daily 15 tablet 6  . cetirizine (ZYRTEC ALLERGY) 10 MG tablet Take 10 mg by mouth as needed.      . clopidogrel (PLAVIX) 75 MG tablet take 1 tablet by mouth once daily 30 tablet 11  . diphenhydrAMINE (BENADRYL) 25 MG tablet Take 25 mg by mouth as needed for itching.     . famotidine (PEPCID) 20 MG tablet Take 20 mg by mouth 2 (two) times daily.     . finasteride (PROSCAR) 5 MG tablet Take 5 mg by mouth daily.      Marland Kitchen losartan (COZAAR) 50 MG tablet take 1 tablet by mouth twice a day 180 tablet 3  . Multiple Vitamins-Minerals (MULTIVITAL) tablet Take 1 tablet by mouth daily.      . Omega-3 Fatty Acids (FISH OIL) 1000 MG CAPS Take 1 capsule by mouth daily.     . pramipexole (MIRAPEX) 0.5 MG tablet take 2 to 3 tablets by mouth at bedtime 90 tablet 3  . rosuvastatin (CRESTOR) 40 MG tablet take 1 tablet by mouth once daily 90 tablet 2  . timolol (TIMOPTIC-XR) 0.5 % ophthalmic gel-forming Place 1 drop into both eyes every morning.  0  . TRAVATAN Z 0.004 % SOLN ophthalmic solution Place 1 drop into both eyes every evening.  0  . nitroGLYCERIN (NITROSTAT) 0.4 MG SL tablet DISSOLVE 1 TABLET UNDER THE TONGUE IF NEEDED AS DIRECTED. (Patient not taking: Reported on 08/18/2016) 25 tablet 3   No current facility-administered medications for this encounter.     BP 136/74   Pulse 60   Wt 197 lb 8 oz (89.6 kg)   SpO2 98%   BMI 28.34  kg/m  General: NAD Neck: No JVD, no thyromegaly or thyroid nodule.  Lungs: Clear to auscultation bilaterally with normal respiratory effort. CV: Nondisplaced PMI.  Heart regular S1/S2, no S3/S4, no murmur.  No peripheral edema.  No carotid bruit.  Normal pedal pulses.  Abdomen: Soft, nontender, no hepatosplenomegaly, no distention.  Neurologic: Alert and oriented x 3.  Psych: Normal affect. Extremities: No clubbing or cyanosis.   Assessment/Plan  CORONARY ATHEROSCLEROSIS  No obstructive coronary disease on cath 8/12. Continue ASA 81, beta blocker, Crestor,  losartan.  Given multiple stents (DES) will continue Plavix.  He has had no bleeding problems. Lexiscan Cardiolite in 4/17 showed no ischemia or infarction. No ischemic symptoms.  Dyspnea  Echo in 8/16 was stable with EF 50-55%, normal Cardiolite in 4/17.  He has had chronic dyspnea.  Stopping Bystolic did not help.  Interestingly, the dyspnea has improved with no intervention.  It was worst in the spring, I wonder if it could be allergy-related.   - With improvement in symptoms, can hold off on CPX.    HYPERCHOLESTEROLEMIA Good lipids 4/17, repeat today.   HTN BP appears controlled.   Followup in 1 year.   Loralie Champagne 08/18/2016

## 2016-08-18 NOTE — Patient Instructions (Signed)
Labs today (will call for abnormal results, otherwise no news is good news)  Follow up in 1 Year

## 2016-08-21 DIAGNOSIS — L570 Actinic keratosis: Secondary | ICD-10-CM | POA: Diagnosis not present

## 2016-08-21 DIAGNOSIS — Z85828 Personal history of other malignant neoplasm of skin: Secondary | ICD-10-CM | POA: Diagnosis not present

## 2016-08-21 DIAGNOSIS — L905 Scar conditions and fibrosis of skin: Secondary | ICD-10-CM | POA: Diagnosis not present

## 2016-09-28 DIAGNOSIS — M7541 Impingement syndrome of right shoulder: Secondary | ICD-10-CM | POA: Diagnosis not present

## 2016-10-02 DIAGNOSIS — J209 Acute bronchitis, unspecified: Secondary | ICD-10-CM | POA: Diagnosis not present

## 2016-10-20 DIAGNOSIS — H401233 Low-tension glaucoma, bilateral, severe stage: Secondary | ICD-10-CM | POA: Diagnosis not present

## 2016-10-20 DIAGNOSIS — H2513 Age-related nuclear cataract, bilateral: Secondary | ICD-10-CM | POA: Diagnosis not present

## 2016-10-20 DIAGNOSIS — H52203 Unspecified astigmatism, bilateral: Secondary | ICD-10-CM | POA: Diagnosis not present

## 2016-11-16 ENCOUNTER — Other Ambulatory Visit: Payer: Self-pay | Admitting: Pulmonary Disease

## 2016-11-26 ENCOUNTER — Other Ambulatory Visit: Payer: Self-pay | Admitting: Cardiology

## 2016-12-10 ENCOUNTER — Other Ambulatory Visit: Payer: Self-pay | Admitting: Physician Assistant

## 2016-12-15 DIAGNOSIS — L570 Actinic keratosis: Secondary | ICD-10-CM | POA: Diagnosis not present

## 2016-12-15 DIAGNOSIS — D225 Melanocytic nevi of trunk: Secondary | ICD-10-CM | POA: Diagnosis not present

## 2016-12-15 DIAGNOSIS — L821 Other seborrheic keratosis: Secondary | ICD-10-CM | POA: Diagnosis not present

## 2016-12-15 DIAGNOSIS — Z85828 Personal history of other malignant neoplasm of skin: Secondary | ICD-10-CM | POA: Diagnosis not present

## 2016-12-17 ENCOUNTER — Other Ambulatory Visit: Payer: Self-pay | Admitting: Cardiology

## 2017-01-04 DIAGNOSIS — N400 Enlarged prostate without lower urinary tract symptoms: Secondary | ICD-10-CM | POA: Diagnosis not present

## 2017-01-04 DIAGNOSIS — R972 Elevated prostate specific antigen [PSA]: Secondary | ICD-10-CM | POA: Diagnosis not present

## 2017-02-08 ENCOUNTER — Other Ambulatory Visit: Payer: Self-pay | Admitting: Cardiology

## 2017-02-22 ENCOUNTER — Other Ambulatory Visit: Payer: Self-pay | Admitting: Cardiology

## 2017-03-30 IMAGING — CR DG CHEST 2V
3 series · 3 of 3 positions shown · non-contrast
Comparison: 02/17/2011

CLINICAL DATA: Dyspnea, shortness of breath for few days

EXAM:
CHEST  2 VIEW

[view not recorded (1 of 3)]
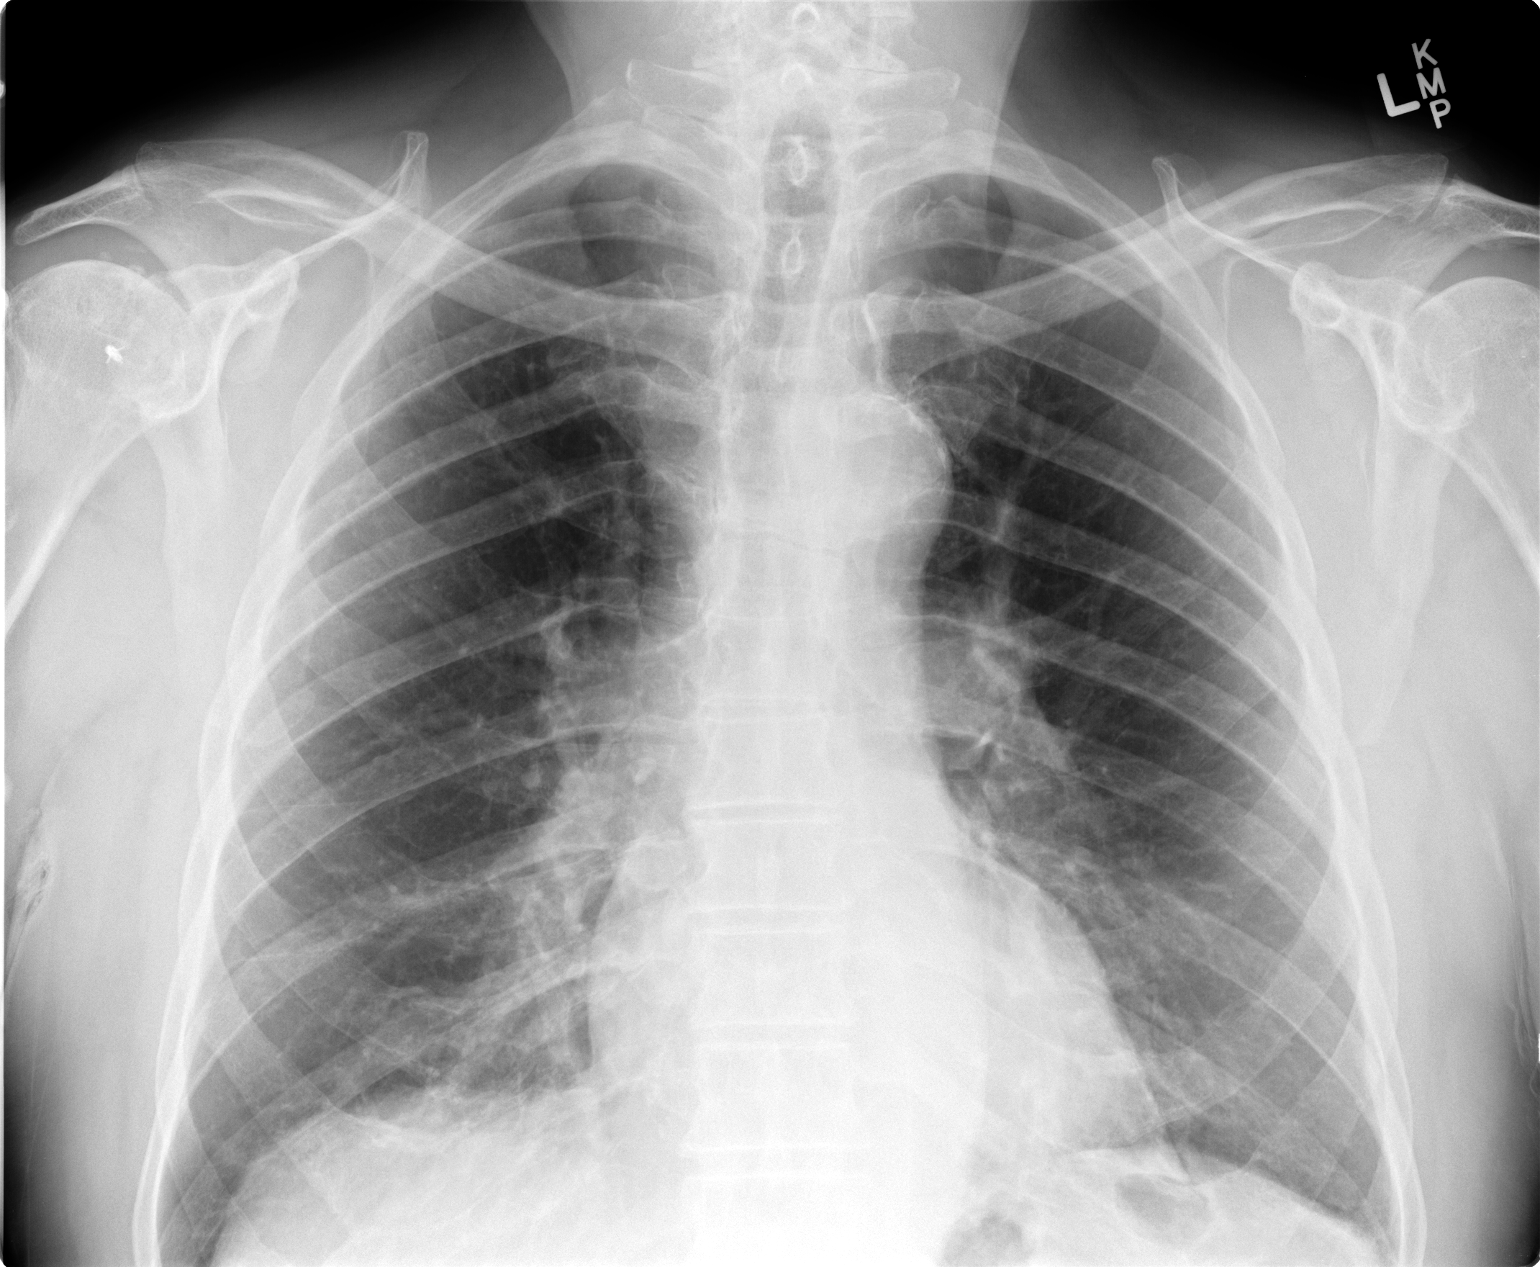

[view not recorded (2 of 3)]
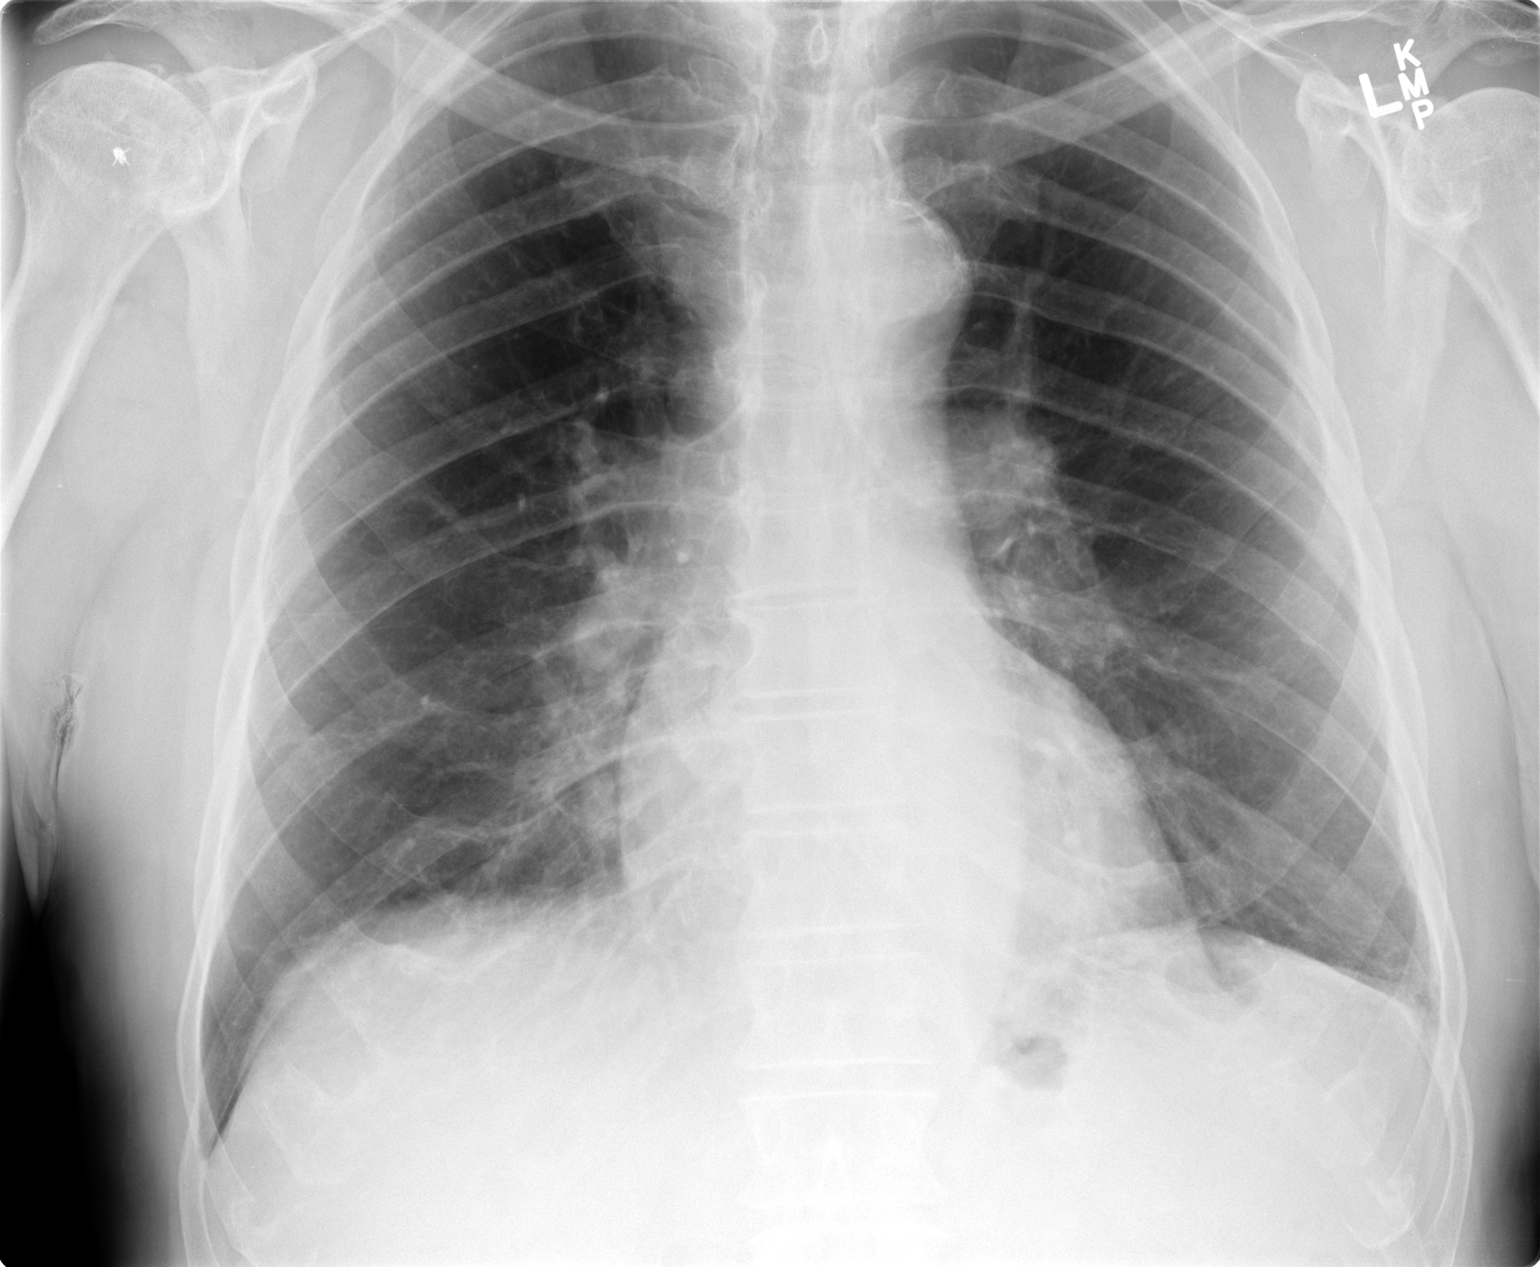

[view not recorded (3 of 3)]
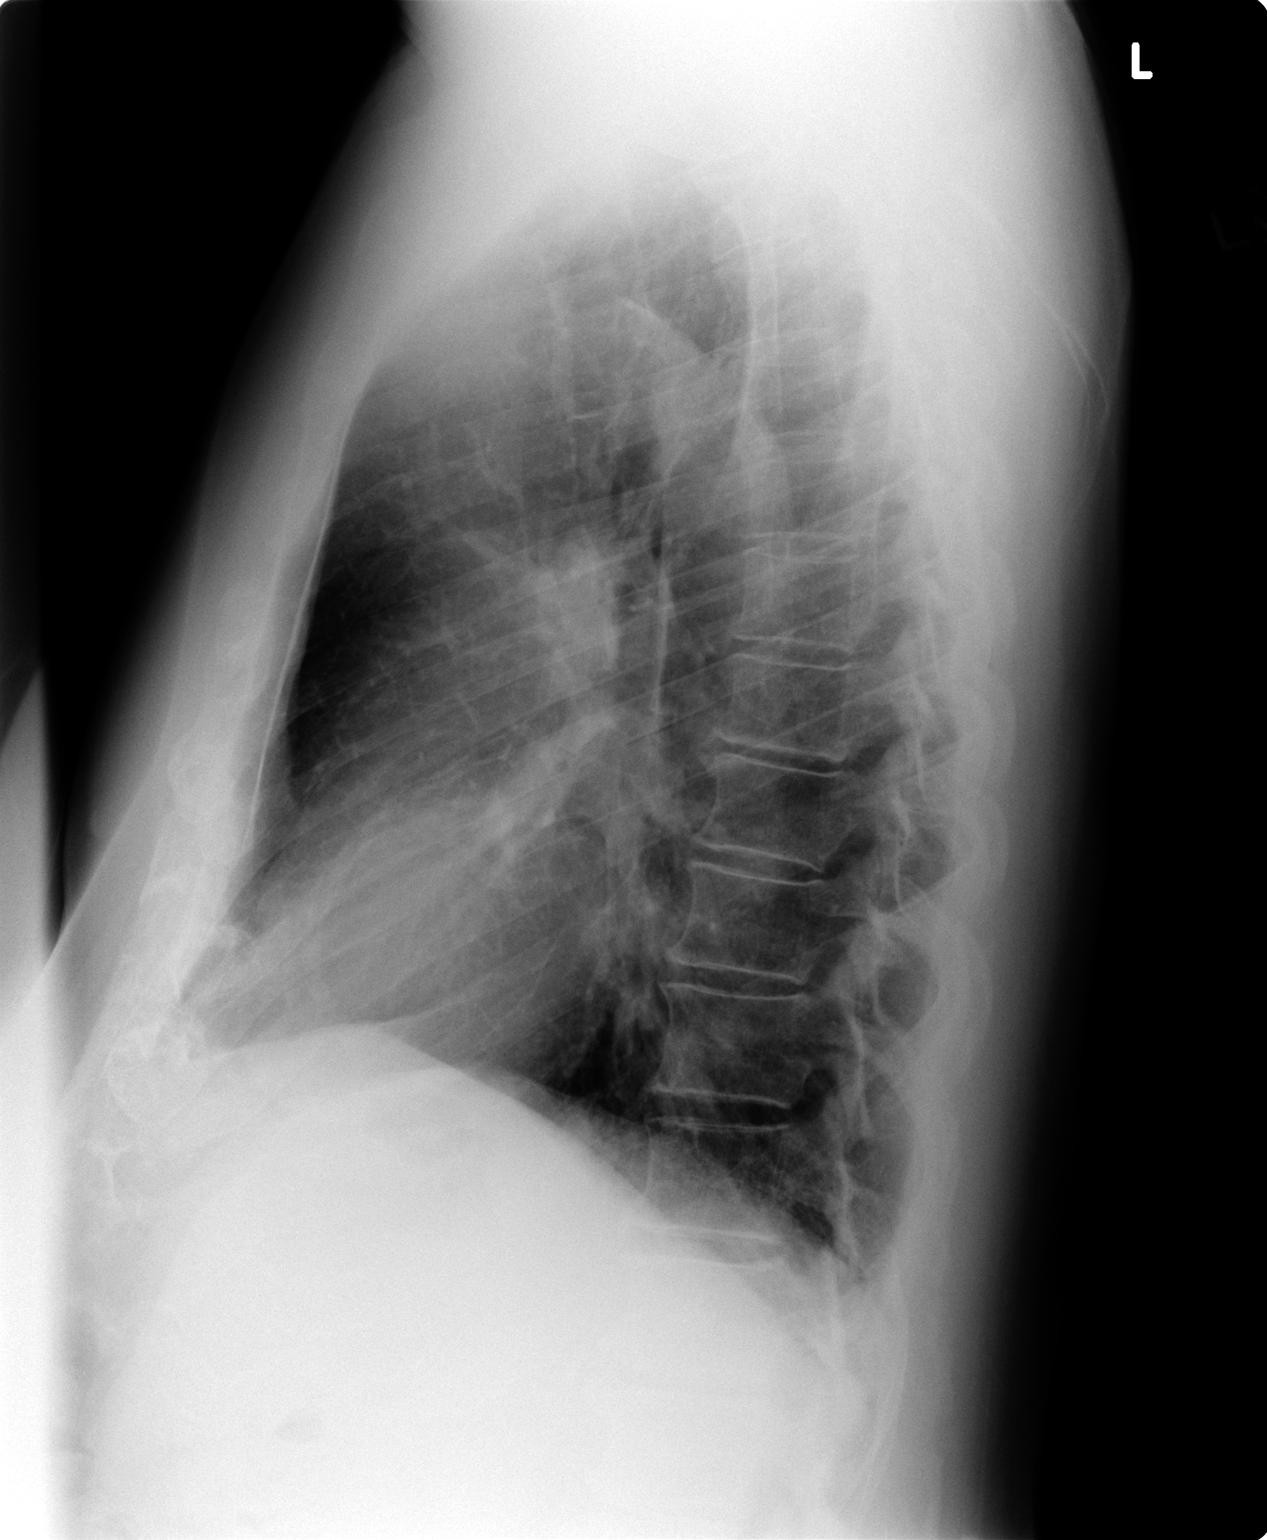

[3 of 3 positions shown; findings below may reference images not displayed]

FINDINGS: Cardiomediastinal silhouette is stable. No acute infiltrate or no
pulmonary edema. Mild degenerative changes thoracic spine.
IMPRESSION: No active cardiopulmonary disease.

## 2017-04-12 ENCOUNTER — Other Ambulatory Visit: Payer: Self-pay | Admitting: Pulmonary Disease

## 2017-04-20 ENCOUNTER — Telehealth: Payer: Self-pay

## 2017-04-20 NOTE — Telephone Encounter (Signed)
   [facility] Pre-operative Risk Assessment    Request for surgical clearance:  1. What type of surgery is being performed? Injection.  2. When is this surgery scheduled? 05/04/2017   3. Are there any medications that need to be held prior to surgery and how long? Requesting medical clearance to hold his plavix for 5 days prior to his injection?   4. Practice name and name of physician performing surgery? [facility]. Not specified    5. What is your office phone and fax number? Phone: [phone]. Fax: [phone]   Anesthesia type (None, local, MAC, general) ? Not specified.  Stephannie Peters 04/20/2017, 4:34 PM  _________________________________________________________________   (provider comments below)

## 2017-04-21 NOTE — Telephone Encounter (Signed)
Pt has not been seen in over 8 months need OV first. Thanks.

## 2017-04-22 NOTE — Telephone Encounter (Signed)
Called to let pt know that he would need an appt before he could be cleared for surgery.  Pt was not happy, but understands due to the fact that he is on blood thinner.  Spoke with Leona Carry @ Dr. Claris Gladden office and I will fwd this phone note to his RN, Shirley Muscat, and they will decide what needs to be done with the pt.

## 2017-04-23 DIAGNOSIS — M1711 Unilateral primary osteoarthritis, right knee: Secondary | ICD-10-CM | POA: Diagnosis not present

## 2017-04-27 DIAGNOSIS — H401233 Low-tension glaucoma, bilateral, severe stage: Secondary | ICD-10-CM | POA: Diagnosis not present

## 2017-05-04 DIAGNOSIS — M5136 Other intervertebral disc degeneration, lumbar region: Secondary | ICD-10-CM | POA: Diagnosis not present

## 2017-05-26 DIAGNOSIS — M1711 Unilateral primary osteoarthritis, right knee: Secondary | ICD-10-CM | POA: Diagnosis not present

## 2017-05-27 ENCOUNTER — Telehealth: Payer: Self-pay

## 2017-05-27 NOTE — Telephone Encounter (Signed)
   [facility] Pre-operative Risk Assessment    Request for surgical clearance:  1. What type of surgery is being performed? INJECTION  2. When is this surgery scheduled? 06/03/17  3. Are there any medications that need to be held prior to surgery and how long? PLAVIX FOR 5 DAYS PRIOR TO INJECTION. PATIENT HAS RECEIVED THESE SAME INJECTIONS BEFORE.   4. Practice name and name of physician performing surgery? [facility]. NOT SPECIFIED.  5. What is your office phone and fax number? P: [phone] F: [phone]  6. Anesthesia type (None, local, MAC, general) ? NOT SPECIFIED   Stephannie Peters 05/27/2017, 3:18 PM  _________________________________________________________________   (provider comments below)

## 2017-05-29 NOTE — Telephone Encounter (Signed)
   Chart reviewed as part of pre-operative protocol coverage.   Received similar pre-op clearance in 03/2017 at which time covering APP recommended patient have OV to clear to come off Plavix given visit was >6 months prior. Patient is followed in Heart Failure clinic as he is historically a patient of Dr. Claris Gladden. The APP pre-op team does not provide clearance for these patients thus will forward to CHF nursing team to touch base with Dr. Aundra Dubin for further input, and to contact surgeon's office with reply. Will remove from pre-op pool.  Charlie Pitter, PA-C 05/29/2017, 3:01 PM

## 2017-05-31 NOTE — Telephone Encounter (Signed)
OK to hold Plavix prior to injection.

## 2017-05-31 NOTE — Telephone Encounter (Signed)
Dr Aundra Dubin please advise:  Can pt hold Plavix 5 days prior to injection

## 2017-06-01 NOTE — Telephone Encounter (Signed)
Note faxed.

## 2017-06-02 DIAGNOSIS — M1711 Unilateral primary osteoarthritis, right knee: Secondary | ICD-10-CM | POA: Diagnosis not present

## 2017-06-03 DIAGNOSIS — M5136 Other intervertebral disc degeneration, lumbar region: Secondary | ICD-10-CM | POA: Diagnosis not present

## 2017-06-09 DIAGNOSIS — M1711 Unilateral primary osteoarthritis, right knee: Secondary | ICD-10-CM | POA: Diagnosis not present

## 2017-07-01 DIAGNOSIS — T148XXA Other injury of unspecified body region, initial encounter: Secondary | ICD-10-CM | POA: Diagnosis not present

## 2017-07-01 DIAGNOSIS — I10 Essential (primary) hypertension: Secondary | ICD-10-CM | POA: Diagnosis not present

## 2017-07-15 DIAGNOSIS — L570 Actinic keratosis: Secondary | ICD-10-CM | POA: Diagnosis not present

## 2017-07-15 DIAGNOSIS — L821 Other seborrheic keratosis: Secondary | ICD-10-CM | POA: Diagnosis not present

## 2017-07-15 DIAGNOSIS — L905 Scar conditions and fibrosis of skin: Secondary | ICD-10-CM | POA: Diagnosis not present

## 2017-07-15 DIAGNOSIS — Z85828 Personal history of other malignant neoplasm of skin: Secondary | ICD-10-CM | POA: Diagnosis not present

## 2017-07-21 DIAGNOSIS — M170 Bilateral primary osteoarthritis of knee: Secondary | ICD-10-CM | POA: Diagnosis not present

## 2017-07-21 DIAGNOSIS — M19012 Primary osteoarthritis, left shoulder: Secondary | ICD-10-CM | POA: Diagnosis not present

## 2017-07-28 DIAGNOSIS — M25511 Pain in right shoulder: Secondary | ICD-10-CM | POA: Diagnosis not present

## 2017-07-28 DIAGNOSIS — M25512 Pain in left shoulder: Secondary | ICD-10-CM | POA: Diagnosis not present

## 2017-08-02 ENCOUNTER — Telehealth: Payer: Self-pay | Admitting: Pulmonary Disease

## 2017-08-02 MED ORDER — AZITHROMYCIN 250 MG PO TABS: 250.0000 mg | ORAL_TABLET | Freq: Every day | ORAL | 0 refills | Status: DC

## 2017-08-02 NOTE — Telephone Encounter (Signed)
Called pt letting him know SN said we could send an Rx of zpak to his pharmacy.  Pt expressed understanding. Rx sent to pt's preferred pharmacy.  Nothing further needed at this current time.

## 2017-08-02 NOTE — Telephone Encounter (Signed)
Called and spoke with pt who stated he has had a head cold since Thursday, 07/29/17.  Pt states that it is worse today than it was yesterday afternoon.  Pt stated he was running a fever Saturday evening for about an hour.  Pt denies any discoloration in mucus and pt is not really coughing but is doing lemon and honey to help keep him from coughing.  Dr. Lenna Gilford, please advise on recommendations for pt.  Thanks!

## 2017-08-24 ENCOUNTER — Encounter (HOSPITAL_COMMUNITY): Payer: Self-pay | Admitting: Cardiology

## 2017-08-24 ENCOUNTER — Ambulatory Visit (HOSPITAL_COMMUNITY)
Admission: RE | Admit: 2017-08-24 | Discharge: 2017-08-24 | Disposition: A | Discharge status: Home / Self Care | Payer: Medicare Other | Source: Ambulatory Visit | Attending: Cardiology | Admitting: Cardiology

## 2017-08-24 VITALS — BP 140/82 | HR 68 | Wt 207.8 lb

## 2017-08-24 DIAGNOSIS — I252 Old myocardial infarction: Secondary | ICD-10-CM | POA: Insufficient documentation

## 2017-08-24 DIAGNOSIS — I2510 Atherosclerotic heart disease of native coronary artery without angina pectoris: Secondary | ICD-10-CM | POA: Diagnosis not present

## 2017-08-24 DIAGNOSIS — I10 Essential (primary) hypertension: Secondary | ICD-10-CM | POA: Insufficient documentation

## 2017-08-24 DIAGNOSIS — Z79899 Other long term (current) drug therapy: Secondary | ICD-10-CM | POA: Diagnosis not present

## 2017-08-24 DIAGNOSIS — Z7902 Long term (current) use of antithrombotics/antiplatelets: Secondary | ICD-10-CM | POA: Diagnosis not present

## 2017-08-24 DIAGNOSIS — Z87891 Personal history of nicotine dependence: Secondary | ICD-10-CM | POA: Insufficient documentation

## 2017-08-24 DIAGNOSIS — E7800 Pure hypercholesterolemia, unspecified: Secondary | ICD-10-CM | POA: Insufficient documentation

## 2017-08-24 DIAGNOSIS — Z7982 Long term (current) use of aspirin: Secondary | ICD-10-CM | POA: Insufficient documentation

## 2017-08-24 LAB — CBC
HEMATOCRIT: 41.1 % (ref 39.0–52.0)
HEMOGLOBIN: 14 g/dL (ref 13.0–17.0)
MCH: 31.1 pg (ref 26.0–34.0)
MCHC: 34.1 g/dL (ref 30.0–36.0)
MCV: 91.3 fL (ref 78.0–100.0)
Platelets: 86 10*3/uL — ABNORMAL LOW (ref 150–400)
RBC: 4.5 MIL/uL (ref 4.22–5.81)
RDW: 14.1 % (ref 11.5–15.5)
WBC: 4.7 10*3/uL (ref 4.0–10.5)

## 2017-08-24 LAB — BASIC METABOLIC PANEL
ANION GAP: 9 (ref 5–15)
BUN: 18 mg/dL (ref 6–20)
CO2: 22 mmol/L (ref 22–32)
Calcium: 8.9 mg/dL (ref 8.9–10.3)
Chloride: 106 mmol/L (ref 101–111)
Creatinine, Ser: 0.74 mg/dL (ref 0.61–1.24)
GFR calc Af Amer: >60 mL/min (ref >60)
Glucose, Bld: 107 mg/dL — ABNORMAL HIGH (ref 65–99)
POTASSIUM: 3.8 mmol/L (ref 3.5–5.1)
SODIUM: 137 mmol/L (ref 135–145)

## 2017-08-24 LAB — LIPID PANEL
CHOL/HDL RATIO: 2.8 ratio
Cholesterol: 125 mg/dL (ref 0–200)
HDL: 44 mg/dL (ref >40)
LDL CALC: 65 mg/dL (ref 0–99)
TRIGLYCERIDES: 82 mg/dL (ref <150)
VLDL: 16 mg/dL (ref 0–40)

## 2017-08-24 NOTE — Patient Instructions (Signed)
Labs drawn today (if we do not call you, then your lab work was stable)   Check BP daily for 2 weeks then call with readings at [phone] opt. 5  Your physician recommends that you schedule a follow-up appointment in: 1 year (March, 2020) Please Call an Schedule Appointment

## 2017-08-24 NOTE — Progress Notes (Signed)
Patient ID: Willie Lynch, male   DOB: [birth year], 84 y.o.   MRN: [id number] PCP: Dr. Lenna Gilford Cardiology: Dr. Aundra Dubin  84 yo with NSTEMI in 5/10 s/p PCI to LAD and RCA.  In 2012, he had trouble with exertional dyspnea.  He did an ETT-myoview in 6/12 showing no ischemia or infarction.  Echo showed EF 45-50% (stable from prior).  Dyspnea improved for a while then worsened again. This time, he had a repeat cath (8/12).  This showed nonobstructive disease with patent stents.  BNP has not been elevated.  PFTs in 10/12 were normal.  I had him stop Coreg and start nebivolol instead with the thought that dyspnea could be related to beta-2 blockade and use of a more selective beta-1 blocker may help.  This seemed to help his dyspnea to a certain extent.   He developed atypical chest pain in 4/17 and was admitted overnight.  Lexiscan Cardiolite showed no ischemia or infarction.  Symptoms were probably GI-related.   I had him try stopping Bystolic to see if this would help his chronic dyspnea.  It made no difference so he restarted the Bystolic.   He returns for followup of CAD.  He is doing well generally, mild dyspnea walking up a hill but more limited by arthritis. He has bilateral knee pain and back pain.  Gets steroid shots in back and knees regularly which help.  Walks for 20-30 minutes on treadmill or rides exercise bike, no dyspnea.  No chest pain.  No palpitations. BP borderline elevated today.   ECG (reviewed personally): NSR, normal  Labs (3/11): K 4.2, creatinine 0.7  Labs (4/11): LFTs nromal, LDL 43, HDL 35  Labs (10/11): HDL 34, LDL 58, LFTs normal  Labs (11/11): K 4.1, creatinine 0.7  Labs (6/12): K 4.1, creatinine 0.8, TSH normal, BNP 19 Labs (7/12): LDL 64, HDL 46 Labs (8/12): K 3.9, creatinine 0.8 Labs (2/13): LDL 41, HDL 23 Labs (8/13): LDL 45, HDL 42, K 3.9, creatinine 0.7 Labs (1/15): K 3.9, creatinine 1.0, LDL 69, HDL 31, HCT 40.7 Labs (1/16): K 4.2, creatinine 0.84, HCT 45, LDL 60,  HDL 49 Labs (7/16): BNP 63, K 3.8, creatinine 0.79, HCT 42.6 Labs (4/17): LDL 46 Labs (6/17): LFTs normal Labs (11/17): K 4.1, creatinine 0.76, HCT 40.8, TSH normal Labs (2/18): LDL 58, HDL 49  Allergies:  1) ! Codeine  2) ! Morphine  3) ! Augmentin (Amoxicillin-Pot Clavulanate)   Past Medical History:  1. CAD: NSTEMI 5/10. Rotational atherectomy/PCI with Xience DES x 3 to RCA and rotational atherectomy/PCI with Xience DES to proximal LAD. ETT-myoview (6/12): 7:33, no ischemic ECG changes, EF 59%, no ischemia or infarction on perfusion images.  Left heart cath (8/12): 60-70% distal CFX, 40% in-stent restenosis in proximal LAD.   - Lexiscan Cardiolite (4/17) with EF 60%, no scar or ischemia.  2. Hyperlipidemia 3. ALLERGIC RHINITIS  4. DIVERTICULOSIS OF COLON - last colon 11/05 w/ divertics only.  5. IRRITABLE BOWEL SYNDROME 6. Hx of ELEVATED PROSTATE SPECIFIC ANTIGEN - followed by Dr Terance Hart for urology  7. Hx of RENAL CYST  8. OSTEOARTHRITIS   9. LOW BACK PAIN, CHRONIC   10. Hx of MIGRAINE HEADACHE  11. HTN: ACEI cough.  12. Esophagitis/GERD  13. Ischemic cardiomyopathy: Mild. Echo (8/10) with EF 45-50%, diffuse hypokinesis, mild AI and mild MR.  Echo (6/12): EF 45-50%, basal to mid inferoposterior hypokinesis, mild aortic insufficiency, grade I diastolic dysfunction.  LV-gram 8/12 with EF 55%.  Echo (8/16) with  EF 50-55%, mild LVH, mildly dilated RV with normal systolic function.  14. Dyspnea: PFTs normal.  Possible intolerance to Coreg.   Family History:  mother died in her early 70s from metastatic cancer, family believes it may have been colon cancer that was diagnosed in her 65s  Father deceased age 19 - heart disease  One sibling alive age 59  One sibling deceased age 65 - unknown  One sibling deceased age 92 -unknown   Social History:  Quit smoking 1963  Alcohol - 4 drinks per week  Exercises  Caffeine - 2 cups per day  Widowed 2 children  Previously worked for  [employer], now Optometrist.   ROS: All systems reviewed and negative except as per HPI.    Current Outpatient Medications  Medication Sig Dispense Refill  . albuterol (PROVENTIL HFA;VENTOLIN HFA) 108 (90 BASE) MCG/ACT inhaler Inhale 2 puffs into the lungs every 6 (six) hours as needed for wheezing or shortness of breath. 1 Inhaler 2  . alfuzosin (UROXATRAL) 10 MG 24 hr tablet Take 10 mg by mouth daily.      Marland Kitchen ALPRAZolam (XANAX) 0.5 MG tablet Take 1/2 to 1 tablet by mouth three times daily as needed for nerves 90 tablet 5  . amLODipine (NORVASC) 10 MG tablet take 1 tablet by mouth once daily 30 tablet 11  . aspirin (ASPIR-LOW) 81 MG EC tablet Take 162 mg by mouth daily.     Marland Kitchen BYSTOLIC 5 MG tablet take 1/2 tablet by mouth once daily 45 tablet 3  . cetirizine (ZYRTEC ALLERGY) 10 MG tablet Take 10 mg by mouth as needed.      . clopidogrel (PLAVIX) 75 MG tablet take 1 tablet by mouth once daily 30 tablet 11  . diphenhydrAMINE (BENADRYL) 25 MG tablet Take 25 mg by mouth as needed for itching.     . famotidine (PEPCID) 20 MG tablet Take 20 mg by mouth 2 (two) times daily.     . finasteride (PROSCAR) 5 MG tablet Take 5 mg by mouth daily.      Marland Kitchen losartan (COZAAR) 50 MG tablet take 1 tablet by mouth twice a day 180 tablet 3  . Multiple Vitamins-Minerals (MULTIVITAL) tablet Take 1 tablet by mouth daily.      . Omega-3 Fatty Acids (FISH OIL) 1000 MG CAPS Take 1 capsule by mouth daily.     . pramipexole (MIRAPEX) 0.5 MG tablet take 2 to 3 tablets by mouth at bedtime 90 tablet 3  . rosuvastatin (CRESTOR) 40 MG tablet take 1 tablet by mouth once daily 90 tablet 3  . timolol (TIMOPTIC-XR) 0.5 % ophthalmic gel-forming Place 1 drop into both eyes every morning.  0  . TRAVATAN Z 0.004 % SOLN ophthalmic solution Place 1 drop into both eyes every evening.  0  . nitroGLYCERIN (NITROSTAT) 0.4 MG SL tablet DISSOLVE 1 TABLET UNDER THE TONGUE IF NEEDED AS DIRECTED. (Patient not taking: Reported on 08/24/2017) 25  tablet 3   No current facility-administered medications for this encounter.     BP 140/82   Pulse 68   Wt 207 lb 12.8 oz (94.3 kg)   SpO2 95%   BMI 29.82 kg/m  General: NAD Neck: No JVD, no thyromegaly or thyroid nodule.  Lungs: Clear to auscultation bilaterally with normal respiratory effort. CV: Nondisplaced PMI.  Heart regular S1/S2, no S3/S4, no murmur.  No peripheral edema.  No carotid bruit.  Normal pedal pulses.  Abdomen: Soft, nontender, no hepatosplenomegaly, no distention.  Skin: Intact  without lesions or rashes.  Neurologic: Alert and oriented x 3.  Psych: Normal affect. Extremities: No clubbing or cyanosis.  HEENT: Normal.   Assessment/Plan  CORONARY ATHEROSCLEROSIS  No obstructive coronary disease on cath 8/12. Continue ASA 81, beta blocker, Crestor, losartan.  Given multiple stents (DES) will continue Plavix.  He has had no bleeding problems. Lexiscan Cardiolite in 4/17 showed no ischemia or infarction. No ischemic symptoms.  Dyspnea  Echo in 8/16 was stable with EF 50-55%, normal Cardiolite in 4/17.  He has had chronic dyspnea.  Stopping Bystolic did not help.  Interestingly, the dyspnea has improved with no intervention.  It was worst in the spring, I wonder if it could be allergy-related.    HYPERCHOLESTEROLEMIA Good lipids 4/17, repeat today.   HTN BP borderline elevated today.  He will check BP daily x 2 wks, will call in with readings.  Check BMET with use of losartan.   Followup in 1 year.   Loralie Champagne 08/24/2017

## 2017-08-27 ENCOUNTER — Other Ambulatory Visit: Payer: Self-pay | Admitting: Pulmonary Disease

## 2017-09-07 ENCOUNTER — Telehealth (HOSPITAL_COMMUNITY): Payer: Self-pay

## 2017-09-07 NOTE — Telephone Encounter (Signed)
BP too high.  Increase losartan to 100 mg daily, BMET 10 days and call with BP readings in 10-14 days on new dose of losartan.

## 2017-09-07 NOTE — Telephone Encounter (Signed)
Pt called in BP readings DAY      BP         PULSE    3/6       150/78        77 3/7       139/72        67 3/8       150/69        74 3/9       145/71        72 3/10     170/80        77             156/80        85             180/89        77             170/90        77             168/88        68             169/84        80             148/90        75             159/85        75             148/75        75

## 2017-09-08 ENCOUNTER — Telehealth (HOSPITAL_COMMUNITY): Payer: Self-pay

## 2017-09-08 MED ORDER — HYDROCHLOROTHIAZIDE 12.5 MG PO CAPS: 12.5000 mg | ORAL_CAPSULE | Freq: Every day | ORAL | 3 refills | Status: DC

## 2017-09-08 NOTE — Telephone Encounter (Signed)
Per Dr. Aundra Dubin  Start hydrochlorothiazide  12.5 mg daily. Pt was on 100 mg of losartan.   BP too high.  Increase losartan to 100 mg daily, BMET 10 days and call with BP readings in 10-14 days on new dose of losartan.       Documentation     You routed conversation to Larey Dresser, MD Yesterday (10:43 AM)    Dennis Bast Yesterday (10:34 AM)     Pt called in BP readings DAY      BP         PULSE    3/6       150/78        77 3/7       139/72        67 3/8       150/69        74 3/9       145/71        72 3/10     170/80        77             156/80        85             180/89        77             170/90        77             168/88        68             169/84        80             148/90        75             159/85        75             148/75        75      Documentation     Jheremy, Boger [phone]  You Yesterday (10:34 AM)

## 2017-09-09 NOTE — Telephone Encounter (Signed)
Katrina RN stated she would follow up with patient.

## 2017-09-17 ENCOUNTER — Ambulatory Visit (HOSPITAL_COMMUNITY)
Admission: RE | Admit: 2017-09-17 | Discharge: 2017-09-17 | Disposition: A | Discharge status: Home / Self Care | Payer: Medicare Other | Source: Ambulatory Visit | Attending: Cardiology | Admitting: Cardiology

## 2017-09-17 ENCOUNTER — Other Ambulatory Visit (HOSPITAL_COMMUNITY): Payer: Self-pay | Admitting: *Deleted

## 2017-09-17 ENCOUNTER — Telehealth (HOSPITAL_COMMUNITY): Payer: Self-pay

## 2017-09-17 DIAGNOSIS — I2510 Atherosclerotic heart disease of native coronary artery without angina pectoris: Secondary | ICD-10-CM

## 2017-09-17 LAB — BASIC METABOLIC PANEL
Anion gap: 10 (ref 5–15)
BUN: 12 mg/dL (ref 6–20)
CALCIUM: 9.6 mg/dL (ref 8.9–10.3)
CO2: 25 mmol/L (ref 22–32)
CREATININE: 0.81 mg/dL (ref 0.61–1.24)
Chloride: 102 mmol/L (ref 101–111)
GFR calc Af Amer: >60 mL/min (ref >60)
GFR calc non Af Amer: >60 mL/min (ref >60)
GLUCOSE: 109 mg/dL — AB (ref 65–99)
Potassium: 3.8 mmol/L (ref 3.5–5.1)
Sodium: 137 mmol/L (ref 135–145)

## 2017-09-17 NOTE — Telephone Encounter (Signed)
Date                BP                    HR  23  145/86  73 24  144/88  73 25  156/79  71  26  139/80  80  27  120/74  82  28  139/85  72

## 2017-09-17 NOTE — Telephone Encounter (Signed)
Let's leave meds same for now.  Work on diet and exercise for weight loss to get BP down a bit.

## 2017-09-23 NOTE — Telephone Encounter (Signed)
With new guidelines, ideally would have SBP < 130. Would have him add HCTZ 25 mg daily with BMET in 2 wks.

## 2017-09-23 NOTE — Telephone Encounter (Signed)
Date BP HR  3/29 139/81  72  3/30 140/79  75   3/31 148/89  73   4/1  144/81  79  4/2 129/78  78   4/3 134/82  79   4/4 152/80  68

## 2017-09-24 NOTE — Telephone Encounter (Signed)
Left VM

## 2017-09-29 DIAGNOSIS — M1712 Unilateral primary osteoarthritis, left knee: Secondary | ICD-10-CM | POA: Diagnosis not present

## 2017-10-06 DIAGNOSIS — M1712 Unilateral primary osteoarthritis, left knee: Secondary | ICD-10-CM | POA: Diagnosis not present

## 2017-10-13 DIAGNOSIS — M1712 Unilateral primary osteoarthritis, left knee: Secondary | ICD-10-CM | POA: Diagnosis not present

## 2017-10-20 DIAGNOSIS — M19011 Primary osteoarthritis, right shoulder: Secondary | ICD-10-CM | POA: Diagnosis not present

## 2017-10-20 DIAGNOSIS — M19012 Primary osteoarthritis, left shoulder: Secondary | ICD-10-CM | POA: Diagnosis not present

## 2017-11-01 DIAGNOSIS — H401233 Low-tension glaucoma, bilateral, severe stage: Secondary | ICD-10-CM | POA: Diagnosis not present

## 2017-11-01 DIAGNOSIS — H2513 Age-related nuclear cataract, bilateral: Secondary | ICD-10-CM | POA: Diagnosis not present

## 2017-11-01 DIAGNOSIS — H52203 Unspecified astigmatism, bilateral: Secondary | ICD-10-CM | POA: Diagnosis not present

## 2017-11-01 DIAGNOSIS — H35373 Puckering of macula, bilateral: Secondary | ICD-10-CM | POA: Diagnosis not present

## 2017-12-02 DIAGNOSIS — H2511 Age-related nuclear cataract, right eye: Secondary | ICD-10-CM | POA: Diagnosis not present

## 2017-12-02 DIAGNOSIS — H25811 Combined forms of age-related cataract, right eye: Secondary | ICD-10-CM | POA: Diagnosis not present

## 2017-12-29 ENCOUNTER — Other Ambulatory Visit (HOSPITAL_COMMUNITY): Payer: Self-pay

## 2017-12-29 MED ORDER — ROSUVASTATIN CALCIUM 40 MG PO TABS: 40.0000 mg | ORAL_TABLET | Freq: Every day | ORAL | 3 refills | Status: DC

## 2018-01-03 DIAGNOSIS — Z961 Presence of intraocular lens: Secondary | ICD-10-CM | POA: Diagnosis not present

## 2018-01-06 ENCOUNTER — Other Ambulatory Visit: Payer: Self-pay | Admitting: Pulmonary Disease

## 2018-01-11 DIAGNOSIS — R972 Elevated prostate specific antigen [PSA]: Secondary | ICD-10-CM | POA: Diagnosis not present

## 2018-01-13 ENCOUNTER — Other Ambulatory Visit (HOSPITAL_COMMUNITY): Payer: Self-pay

## 2018-01-13 MED ORDER — HYDROCHLOROTHIAZIDE 12.5 MG PO CAPS: 12.5000 mg | ORAL_CAPSULE | Freq: Every day | ORAL | 5 refills | Status: DC

## 2018-01-25 DIAGNOSIS — R972 Elevated prostate specific antigen [PSA]: Secondary | ICD-10-CM | POA: Diagnosis not present

## 2018-01-27 DIAGNOSIS — L720 Epidermal cyst: Secondary | ICD-10-CM | POA: Diagnosis not present

## 2018-01-27 DIAGNOSIS — Z85828 Personal history of other malignant neoplasm of skin: Secondary | ICD-10-CM | POA: Diagnosis not present

## 2018-01-27 DIAGNOSIS — L570 Actinic keratosis: Secondary | ICD-10-CM | POA: Diagnosis not present

## 2018-02-22 ENCOUNTER — Other Ambulatory Visit: Payer: Self-pay

## 2018-02-22 ENCOUNTER — Telehealth: Payer: Self-pay

## 2018-02-22 ENCOUNTER — Other Ambulatory Visit: Payer: Self-pay | Admitting: Cardiology

## 2018-02-22 MED ORDER — AMLODIPINE BESYLATE 10 MG PO TABS: 10.0000 mg | ORAL_TABLET | Freq: Every day | ORAL | 0 refills | Status: DC

## 2018-02-22 NOTE — Telephone Encounter (Signed)
THANK YOU. BN

## 2018-02-22 NOTE — Telephone Encounter (Signed)
Pt needs to call office to schedule an appointment for any future refills. 2ND attempt. BN

## 2018-02-22 NOTE — Telephone Encounter (Signed)
OK to refill

## 2018-02-23 ENCOUNTER — Other Ambulatory Visit: Payer: Self-pay

## 2018-02-23 MED ORDER — CLOPIDOGREL BISULFATE 75 MG PO TABS: 75.0000 mg | ORAL_TABLET | Freq: Every day | ORAL | 6 refills | Status: DC

## 2018-03-21 ENCOUNTER — Other Ambulatory Visit (HOSPITAL_COMMUNITY): Payer: Self-pay

## 2018-03-21 MED ORDER — LOSARTAN POTASSIUM 50 MG PO TABS
50.0000 mg | ORAL_TABLET | Freq: Two times a day (BID) | ORAL | 1 refills | Status: DC
Start: 2018-03-21 — End: 2018-03-30

## 2018-03-24 ENCOUNTER — Other Ambulatory Visit: Payer: Self-pay

## 2018-03-24 NOTE — Patient Outreach (Signed)
[facility]) Care Management  03/24/2018  Willie Lynch December 21, 1933 [id number]   Medication Adherence call to Willie Lynch spoke with patient he did not want to provide with any of his information patient  hung up the phone did not want to engage he Lynch due on Losartan 50 mg. Willie Lynch showing past due under [facility].   [facility] Direct Dial [phone]  Fax [phone] [name]

## 2018-03-30 ENCOUNTER — Ambulatory Visit: Payer: Medicare Other | Admitting: Family Medicine

## 2018-03-30 ENCOUNTER — Encounter: Payer: Self-pay | Admitting: Family Medicine

## 2018-03-30 VITALS — BP 130/64 | HR 74 | Temp 97.9°F | Ht 68.5 in | Wt 203.1 lb

## 2018-03-30 DIAGNOSIS — D696 Thrombocytopenia, unspecified: Secondary | ICD-10-CM

## 2018-03-30 DIAGNOSIS — R5383 Other fatigue: Secondary | ICD-10-CM | POA: Diagnosis not present

## 2018-03-30 DIAGNOSIS — K219 Gastro-esophageal reflux disease without esophagitis: Secondary | ICD-10-CM

## 2018-03-30 DIAGNOSIS — R7309 Other abnormal glucose: Secondary | ICD-10-CM | POA: Diagnosis not present

## 2018-03-30 DIAGNOSIS — I2510 Atherosclerotic heart disease of native coronary artery without angina pectoris: Secondary | ICD-10-CM

## 2018-03-30 DIAGNOSIS — E7800 Pure hypercholesterolemia, unspecified: Secondary | ICD-10-CM | POA: Diagnosis not present

## 2018-03-30 DIAGNOSIS — J309 Allergic rhinitis, unspecified: Secondary | ICD-10-CM

## 2018-03-30 DIAGNOSIS — N401 Enlarged prostate with lower urinary tract symptoms: Secondary | ICD-10-CM

## 2018-03-30 DIAGNOSIS — N138 Other obstructive and reflux uropathy: Secondary | ICD-10-CM

## 2018-03-30 DIAGNOSIS — I10 Essential (primary) hypertension: Secondary | ICD-10-CM

## 2018-03-30 LAB — COMPREHENSIVE METABOLIC PANEL
ALBUMIN: 4.3 g/dL (ref 3.5–5.2)
ALT: 18 U/L (ref 0–53)
AST: 18 U/L (ref 0–37)
Alkaline Phosphatase: 62 U/L (ref 39–117)
BUN: 16 mg/dL (ref 6–23)
CALCIUM: 9.6 mg/dL (ref 8.4–10.5)
CO2: 29 mEq/L (ref 19–32)
CREATININE: 0.71 mg/dL (ref 0.40–1.50)
Chloride: 102 mEq/L (ref 96–112)
GFR: 112.69 mL/min (ref >60.00)
Glucose, Bld: 94 mg/dL (ref 70–99)
POTASSIUM: 3.8 meq/L (ref 3.5–5.1)
Sodium: 137 mEq/L (ref 135–145)
Total Bilirubin: 1.3 mg/dL — ABNORMAL HIGH (ref 0.2–1.2)
Total Protein: 6.7 g/dL (ref 6.0–8.3)

## 2018-03-30 LAB — CBC WITH DIFFERENTIAL/PLATELET
BASOS PCT: 0.8 % (ref 0.0–3.0)
Basophils Absolute: 0 10*3/uL (ref 0.0–0.1)
EOS PCT: 2.8 % (ref 0.0–5.0)
Eosinophils Absolute: 0.2 10*3/uL (ref 0.0–0.7)
HEMATOCRIT: 41.5 % (ref 39.0–52.0)
HEMOGLOBIN: 14.1 g/dL (ref 13.0–17.0)
LYMPHS PCT: 23.2 % (ref 12.0–46.0)
Lymphs Abs: 1.4 10*3/uL (ref 0.7–4.0)
MCHC: 34 g/dL (ref 30.0–36.0)
MCV: 90.1 fl (ref 78.0–100.0)
MONOS PCT: 12.1 % — AB (ref 3.0–12.0)
Monocytes Absolute: 0.7 10*3/uL (ref 0.1–1.0)
Neutro Abs: 3.6 10*3/uL (ref 1.4–7.7)
Neutrophils Relative %: 61.1 % (ref 43.0–77.0)
Platelets: 119 10*3/uL — ABNORMAL LOW (ref 150.0–400.0)
RBC: 4.61 Mil/uL (ref 4.22–5.81)
RDW: 14.2 % (ref 11.5–15.5)
WBC: 5.9 10*3/uL (ref 4.0–10.5)

## 2018-03-30 LAB — HEMOGLOBIN A1C: HEMOGLOBIN A1C: 5.6 % (ref 4.6–6.5)

## 2018-03-30 MED ORDER — LOSARTAN POTASSIUM 50 MG PO TABS: 50.0000 mg | ORAL_TABLET | Freq: Every day | ORAL | 1 refills | Status: DC

## 2018-03-30 MED ORDER — FAMOTIDINE 20 MG PO TABS: 20.0000 mg | ORAL_TABLET | Freq: Every day | ORAL | 2 refills | Status: DC | PRN

## 2018-03-30 MED ORDER — NEBIVOLOL HCL 5 MG PO TABS
5.0000 mg | ORAL_TABLET | Freq: Every day | ORAL | 3 refills | Status: DC
Start: 2018-03-30 — End: 2019-08-25

## 2018-03-30 NOTE — Progress Notes (Signed)
Willie Lynch DOB: Sep 05, 1933 Encounter date: 03/30/2018  This is a 84 y.o. male who presents to establish care. Chief Complaint  Patient presents with  . New Patient (Initial Visit)    no new concerns    History of present illness: [facility] nurse did home visit and wanted him set up with primary; previous doc went to part time and with cut back he had to pick new provider.   Follows with cardiology on regular basis. Dr. Aundra Dubin. MI was 15 years ago; follows yearly with them.   Arthritis: bothered him more back when he was playing golf. Stiff fingers in morning; more in joints, esp thumbs. Some in hips and knees. Doesn't usually take medication; just occasionally tylenol.   Had to hold off on golf due to shoulders. following with Dr. Stacie Glaze ortho. Gets injections.   Past Medical History:  Diagnosis Date  . Abdominal pain, unspecified site   . Acquired cyst of kidney   . Allergic rhinitis, cause unspecified   . CAD (coronary artery disease)    NSTEMI 5/10. Rotational atherectomy/PCI w Xience DES x 3 to RCA and rotation atherectomy /PCI w Xience DES to prox LAD  . Diverticulosis of colon (without mention of hemorrhage)    last colon 11/05 by [facility] w divertics only  . Elevated prostate specific antigen (PSA)    followed by Dr Terance Hart for urology  . GERD (gastroesophageal reflux disease)    esophagitis  . Hyperlipidemia   . Hypertension    ACEI cough  . Irritable bowel syndrome   . Ischemic cardiomyopathy    mild echo (8/10) w EF 45-50%, diffuse hypokinesis, mild AI and mild MR  . Lumbago   . Migraine, unspecified, without mention of intractable migraine without mention of status migrainosus   . Osteoarthrosis, unspecified whether generalized or localized, unspecified site    Past Surgical History:  Procedure Laterality Date  . ANGIOPLASTY    . bilat inguinal hernia repairs  7/06   Dr Hassell Done  . CARDIAC CATHETERIZATION    . CORONARY ANGIOPLASTY     x4  .  rotator cuff surgery     Dr. Shellia Carwin   Allergies  Allergen Reactions  . Amoxicillin-Pot Clavulanate     REACTION: pt developed HIVES on augmentin  . Codeine   . Morphine    Current Meds  Medication Sig  . alfuzosin (UROXATRAL) 10 MG 24 hr tablet Take 10 mg by mouth daily.    Marland Kitchen amLODipine (NORVASC) 10 MG tablet TAKE 1 TABLET BY MOUTH EVERY DAY, NEED TO CALL OFFICE FOR FURTHER REFILLS  . aspirin (ASPIR-LOW) 81 MG EC tablet Take 162 mg by mouth daily.   . cetirizine (ZYRTEC ALLERGY) 10 MG tablet Take 10 mg by mouth as needed.    . clopidogrel (PLAVIX) 75 MG tablet Take 1 tablet (75 mg total) by mouth daily.  . famotidine (PEPCID) 20 MG tablet Take 1 tablet (20 mg total) by mouth daily as needed for heartburn or indigestion.  . finasteride (PROSCAR) 5 MG tablet Take 5 mg by mouth daily.    . hydrochlorothiazide (MICROZIDE) 12.5 MG capsule Take 1 capsule (12.5 mg total) by mouth daily.  Marland Kitchen losartan (COZAAR) 50 MG tablet Take 1 tablet (50 mg total) by mouth daily.  . Multiple Vitamins-Minerals (MULTIVITAL) tablet Take 1 tablet by mouth daily.    . nebivolol (BYSTOLIC) 5 MG tablet Take 1 tablet (5 mg total) by mouth daily.  . nitroGLYCERIN (NITROSTAT) 0.4 MG SL tablet DISSOLVE 1  TABLET UNDER THE TONGUE IF NEEDED AS DIRECTED.  . Omega-3 Fatty Acids (FISH OIL) 1000 MG CAPS Take 1 capsule by mouth daily.   . pramipexole (MIRAPEX) 0.5 MG tablet take 2 to 3 tablets by mouth at bedtime  . rosuvastatin (CRESTOR) 40 MG tablet Take 1 tablet (40 mg total) by mouth daily.  . timolol (TIMOPTIC-XR) 0.5 % ophthalmic gel-forming Place 1 drop into both eyes every morning.  . TRAVATAN Z 0.004 % SOLN ophthalmic solution Place 1 drop into both eyes every evening.  . [DISCONTINUED] albuterol (PROVENTIL HFA;VENTOLIN HFA) 108 (90 BASE) MCG/ACT inhaler Inhale 2 puffs into the lungs every 6 (six) hours as needed for wheezing or shortness of breath.  . [DISCONTINUED] ALPRAZolam (XANAX) 0.5 MG tablet Take 1/2 to 1  tablet by mouth three times daily as needed for nerves  . [DISCONTINUED] BYSTOLIC 5 MG tablet take 1/2 tablet by mouth once daily  . [DISCONTINUED] diphenhydrAMINE (BENADRYL) 25 MG tablet Take 25 mg by mouth as needed for itching.   . [DISCONTINUED] famotidine (PEPCID) 20 MG tablet Take 20 mg by mouth 2 (two) times daily.   . [DISCONTINUED] losartan (COZAAR) 50 MG tablet Take 1 tablet (50 mg total) by mouth 2 (two) times daily.   Social History   Tobacco Use  . Smoking status: Former Smoker    Last attempt to quit: 06/22/1961    Years since quitting: 56.8  . Smokeless tobacco: Never Used  Substance Use Topics  . Alcohol use: Yes    Comment: 4 drinks per week    Family History  Problem Relation Age of Onset  . Heart disease Father        smoker  . Heart attack Father   . Cancer Mother   . Cancer Unknown        ?? not sure what kind     Review of Systems  Constitutional: Negative for chills, fatigue (feels like energy is not quite what it used to be, but thinks normal for age) and fever.  Respiratory: Negative for cough, chest tightness, shortness of breath and wheezing.   Cardiovascular: Negative for chest pain, palpitations and leg swelling.  Musculoskeletal: Positive for arthralgias. Negative for joint swelling.  Psychiatric/Behavioral: Negative for sleep disturbance.    Objective:  BP 130/64 (BP Location: Left Arm, Patient Position: Sitting, Cuff Size: Normal)   Pulse 74   Temp 97.9 F (36.6 C) (Oral)   Ht 5' 8.5" (1.74 m)   Wt 203 lb 1.6 oz (92.1 kg)   SpO2 97%   BMI 30.43 kg/m   Weight: 203 lb 1.6 oz (92.1 kg)   BP Readings from Last 3 Encounters:  03/30/18 130/64  08/24/17 140/82  08/18/16 136/74   Wt Readings from Last 3 Encounters:  03/30/18 203 lb 1.6 oz (92.1 kg)  08/24/17 207 lb 12.8 oz (94.3 kg)  08/18/16 197 lb 8 oz (89.6 kg)    Physical Exam  Constitutional: He is oriented to person, place, and time. He appears well-developed and well-nourished.  No distress.  Cardiovascular: Normal rate, regular rhythm and normal heart sounds. Exam reveals no friction rub.  No murmur heard. No lower extremity edema  Pulmonary/Chest: Effort normal and breath sounds normal. No respiratory distress. He has no wheezes. He has no rales.  Neurological: He is alert and oriented to person, place, and time.  Psychiatric: His behavior is normal. Cognition and memory are normal.    Assessment/Plan: 1. HYPERCHOLESTEROLEMIA Continue crestor.  2. Fatigue, unspecified type Check baseline  bloodwork.   3. Thrombocytopenia ([place]) Recheck cbc; may be medication related..   4. Essential hypertension Stable; continue current medication. Wasn't sure if he was taking hctz; but we did confirm with pharmacy that he has been filling and bp is well controlled today.  5. Atherosclerosis of native coronary artery of native heart without angina pectoris Follows with cardiology regularly.  6. Allergic rhinitis, unspecified seasonality, unspecified trigger Stable.  7. Gastroesophageal reflux disease without esophagitis Stable with pepcid and dietary control.  8. Benign prostatic hyperplasia with urinary obstruction Follows with urology. On proscar daily.  Return in about 6 months (around 09/29/2018).  Micheline Rough, MD

## 2018-03-31 ENCOUNTER — Other Ambulatory Visit: Payer: Self-pay | Admitting: Pulmonary Disease

## 2018-04-08 ENCOUNTER — Other Ambulatory Visit: Payer: Self-pay | Admitting: Pulmonary Disease

## 2018-04-09 ENCOUNTER — Other Ambulatory Visit: Payer: Self-pay | Admitting: Pulmonary Disease

## 2018-04-11 ENCOUNTER — Other Ambulatory Visit: Payer: Self-pay | Admitting: Pulmonary Disease

## 2018-04-11 ENCOUNTER — Telehealth: Payer: Self-pay | Admitting: Pulmonary Disease

## 2018-04-11 MED ORDER — PRAMIPEXOLE DIHYDROCHLORIDE 0.5 MG PO TABS: ORAL_TABLET | ORAL | 0 refills | Status: DC

## 2018-04-11 NOTE — Telephone Encounter (Signed)
Dr. Lenna Gilford, please advise if you ok with [place] writing a 10 day supply RX for pramipexole. Thanks!

## 2018-04-11 NOTE — Telephone Encounter (Signed)
Per SN- ok to refill 1 time, Mirapex 0.5mg , 2tabs at bedtime, #30, no refills. Patient recently saw Dr. Ethlyn Gallery, [facility], 03/30/18, PCP. He needs to get all future refills with them.Called and spoke with Patient.  SN recommendations given. Patient stated that he would follow up with his new PCP. Prescription sent to requested pharmacy, [facility], [place]. Nothing further at this time.

## 2018-04-21 ENCOUNTER — Encounter: Payer: Self-pay | Admitting: Pulmonary Disease

## 2018-04-21 ENCOUNTER — Ambulatory Visit: Payer: Medicare Other | Admitting: Pulmonary Disease

## 2018-04-21 ENCOUNTER — Ambulatory Visit (INDEPENDENT_AMBULATORY_CARE_PROVIDER_SITE_OTHER): Payer: Medicare Other

## 2018-04-21 VITALS — BP 128/62 | HR 71 | Temp 97.8°F | Ht 68.5 in | Wt 201.4 lb

## 2018-04-21 DIAGNOSIS — I2510 Atherosclerotic heart disease of native coronary artery without angina pectoris: Secondary | ICD-10-CM

## 2018-04-21 DIAGNOSIS — E7800 Pure hypercholesterolemia, unspecified: Secondary | ICD-10-CM | POA: Diagnosis not present

## 2018-04-21 DIAGNOSIS — M545 Low back pain, unspecified: Secondary | ICD-10-CM

## 2018-04-21 DIAGNOSIS — I10 Essential (primary) hypertension: Secondary | ICD-10-CM | POA: Diagnosis not present

## 2018-04-21 DIAGNOSIS — R0609 Other forms of dyspnea: Secondary | ICD-10-CM

## 2018-04-21 DIAGNOSIS — G8929 Other chronic pain: Secondary | ICD-10-CM

## 2018-04-21 DIAGNOSIS — M159 Polyosteoarthritis, unspecified: Secondary | ICD-10-CM

## 2018-04-21 DIAGNOSIS — M150 Primary generalized (osteo)arthritis: Secondary | ICD-10-CM

## 2018-04-21 DIAGNOSIS — K219 Gastro-esophageal reflux disease without esophagitis: Secondary | ICD-10-CM | POA: Diagnosis not present

## 2018-04-21 DIAGNOSIS — Z23 Encounter for immunization: Secondary | ICD-10-CM | POA: Diagnosis not present

## 2018-04-21 DIAGNOSIS — G2581 Restless legs syndrome: Secondary | ICD-10-CM

## 2018-04-21 MED ORDER — PRAMIPEXOLE DIHYDROCHLORIDE 0.5 MG PO TABS: ORAL_TABLET | ORAL | 6 refills | Status: DC

## 2018-04-21 MED ORDER — PRAMIPEXOLE DIHYDROCHLORIDE 0.5 MG PO TABS: ORAL_TABLET | ORAL | 2 refills | Status: DC

## 2018-04-21 NOTE — Progress Notes (Addendum)
Subjective:    Patient ID: Willie Lynch, male    DOB: 1933-07-30, 84 y.o.   MRN: [id number]  HPI 84 y/o WM whom I last saw in 2009 for a routine follow up... He has a hx CAD- NSTEMI 5/10 w/ PCI to LAD & RCA, & followed by DrMcLean on ASA, Plavix, Losartan; and Hyperchol on Cres40... ~  SEE PREV EPIC NOTES FOR OLDER DATA >>    CT Chest 08/15/04 showed norm heart size & config, mild aortic elongation/ no aneurysm, clear lungs x mild diffuse peribronch thickening- no effusion or pneumothorax, no adenopathy, abd- neg x simple cysts in both kidneys...   FullPFT 03/23/11 showed FVC=4.26 (100), FEV1=3.17 (114%), %1sec=74, mid-flows wnl at 99% predicted; no change post bronchodil; TLC=6.40 (99%), RV=2.13 (81%), RV/TLC=33;  DLCO=83% predicted...   ~  Nov 08, 2013:  53mo ROV & Dandy's wife Willie Lynch died last week in [facility], he is quite tearful but processing his loss quite appropriately; he notes incr RLS symptoms & we discussed incr in his Mirapex to 0.5mg - 1to2 tabs qhs as needed, and he requests something for his nerves- try Xanax0.5,g 1/2-1 tab Tid prn... We reviewed the following medical problems during today's office visit >>     AR> on Zyrtek; he has mild seasonal symptoms... Note: PFTs & CXR were wnl...    HBP> on [id number] & back on Bystolic5-1/2tab/d; Hx of ACE cough in past; BP= 116/66 & he denies CP, palpit, SOB, edema...    CAD, s/pNSTEMI> on ASA81 & Plavix75; he had f/u DrMcLean 1/15> Hx NSTEMI w/ PCI to LAD & RCA in 2010; Myoview 6/12 was neg for ischemia & 2DEcho showed EF=45-50% (stable, no change); repeat cath 8/12 showed patent stents and nonobstructive dis...    Hyperlipid> on Cres40 + FishOil; last FLP 1/15 showed TChol 136, TG 180, HDL 31, LDL 69    GERD> on Pepcid20Bid; he denies abd pain, n/v, c/d, blood seen...    Divertics, IBS> followed by DrJacobs and he has never had a polyp but +FamHx colon ca in his mother; f/u colon due 2015...    Hx BPH, elev PSA, ED> on  Uroxatrol10, Proscar5, Viagra100; followed by DrEskridge; seen 10/13 and stable w/ PSA reported normal per pt...    DJD, LBP> on OTC analgesics as needed; he is followed by [facility] [place] w/ shots in his back periodically which really help per pt...    RLS> on Mirapex0.25 taking 1-2 Qhs but notes incr symptoms and we will double to Mirapex0.5mg - 1-2Qhs for symptoms...    Anxiety & Insomnia> on Benedryl25 prn; he's been axious since the passing of his wife 5/15- add Xanax 0.5mg  1/2 to 1 tab tid prn... We reviewed prob list, meds, xrays and labs> see below for updates >>   EKG 1/15 showed NSR, rate71, wnl, NAD...   LABS 1/15:  FLP- ok on Cres40 but TG=180 & HDL=31, advised low fat diet & incr exercise; Chems- wnl;  CBC- wnl...  ~  June 13, 2015:  43mo Hendrum continues his regular f/u w/ [facility]- DrEskridge, they do his labs... He presents w/ 2 main concerns>  1) RLS- on Mirapex0.5-2Qhs which helps about 60% of the time, the other 40% he can't sleep; we discussed trying to increase the Mirapex0.5 to 3 tabs Qhs & try taking it about 1H prior to bedtime; he will let me know how this is working for him...  2) Breathing issue- "DrMcLean said I  have asthma" prescribed AlbutHFA for prn use & this helps a little he says; he is c/o SOB/ DOE eg w/ stairs and golfing but denies chest tightness/ wheezing/ etc; he has a mild AM cough, no sput, no hemoptysis; he does 47min exercise 5d/wk (bike & elliptical), plus golfing;  Spirometry today is wnl & he is rec to continue the AlbutHFA prn wheezing... We reviewed the following medical problems during today's office visit >>     AR> on Zyrtek; he has mild seasonal symptoms... Note: PFTs & CXR were wnl...    HBP> on [id number], Amlod5, Bystolic5-1/2tab/d; Hx of ACE cough in past; BP= 128/70 & he denies CP, palpit, dizzy, edema; he has DOE that is surely multifactorial in nature & not progressive...    CAD, s/pNSTEMI> on ASA81 &  Plavix75; he had f/u DrMcLean 8/16 (note reviewed)> Hx NSTEMI w/ PCI to LAD & RCA in 2010; Myoview 6/12 was neg for ischemia & 2DEcho showed EF=45-50% (stable, no change); repeat cath 8/12 showed patent stents and nonobstructive dis; his dyspnea was sl better after switching Coreg to Bystolic; he remains active & the DOE seems multifactorial...    Hyperlipid> on Cres40 + FishOil; last FLP 1/16 showed TChol 125, TG 83, HDL 49, LDL 60 => much improved continue same med + diet/ exercise...    GERD> on Pepcid20Bid; he denies abd pain, n/v, c/d, blood seen... DrJacobs sent him a letter to f/u w/ him to discuss f/u colon...    Divertics, IBS> followed by DrJacobs and he has never had a polyp but +FamHx colon ca in his mother; last colonoscopy 1/05 (DrLeB) showed mod severe divertics, no polyp; he had f/u w/ DrJacobs 1/15 & they agreed to suspend f/u colons.    Hx BPH, elev PSA, ED> on Uroxatrol10, Proscar5, Viagra100; followed by DrEskridge; seen 11/15 and stable w/ PSA reported normal per pt...    DJD, LBP> on OTC analgesics as needed; he is followed by [facility] [place] w/ shots in his back periodically which really help per pt...    RLS> on Mirapex0.25 taking 1-2 Qhs but notes incr symptoms and we discussed incr Mirapex0.25 to 3tabs taken about 1H prior to bedtime & he will let me know how well this is working for him...    Anxiety & Insomnia> on Benedryl25 prn; he's been axious since the passing of his wife 5/15- add Xanax 0.5mg  1/2 to 1 tab tid prn... EXAM shows Afeb, VSS, O2sat=97% on RA at rest;  HEENT- neg, mallamapti1;  Chest- clear w/o w/r/r;  Heart- RR w/o m/r/g;  Abd- soft, nontender;  Ext- neg w/o c/c/e;  Neuro- intact...  Last CXR was 12/31/14 showing norm heart size, clear lungs/ NAD, mild DJD in Tspine...   Last 2DEcho 01/31/15 showed mild LVH, norm LVF w/ EF=50-55%, mild infer HK, Gr1DD, AoV sclerosis, mild AI, triv MR & calcif annulus, no real change from 2012...   Spirometry  06/13/15 showed FVC=4.08 (94%), FEV1=3.26 (100%), %1sec=80, mid-flows are wnl at 120% predicted=> the numbers are normal, the tracing showed some difficulty w/ the testing procedure.  LABS in Epic 2016 reviewed>  FLP- at goals,  Chems- wnl,  BNP=63,  CBC- wnl..dermatitis-dimer- neg...  IMP/PLAN>>  Willie Lynch's dyspnea appears multifactorial and non-progressive, he is able to exercise 5d/wk & play golf w/o problems; OK to use the AlbutHFA prn for wheezing & continue f/u w/ [facility]/ DrMcLean;  His RLS may respond to an incr in his Mirapex0.25- 3Qhs & takeit about 1H prior  to sleep;  We reviewed his numerous medcal issues as above... ROV 56yr, sooner if needed prn...   ~  June 27, 2015:  2wk ROV & add-on appt requested for fall w/ skin tear>  Willie Lynch was just here 2wks ago for a 32mo ROV (see above); he fell down some steps at home (tripped- did not hit head) w/ skin tear on left forearm; he did not go to ER- friend cleaned & dressed it daily using peroxide & neosporin, non-stick pad & paper tape;  Exam shows an L-shaped skin tear but it all looks clean, no drainage, no erythema, no purulence, and the skin appears to have attached itself nicely w/ the current dresssing regimen... We inspected the wound, cleaned it again & dressed it w/ Telfa, gauze roll, & paper tape; pt givn instructions for home care & asked to call for any problems or questions...  ADDENDUM>>  He was [facility] 4/4 - 09/25/15 by [facility] w/ CP => DCSummary reviewed...  ~  May 21, 2016:  20mo ROV & pulm/medical follow up visit>  Willie Lynch presents w/ c/o "breathing is worse & time for a check up";  He continues to note SOB/DOE w/ stairs & "putting on my socks and shoes" but notes that he exercises at a gym 5d/wk on treadmill & bike without difficulty- he notes heart rate up into the 90s only & states he has no prob w/ this degree of activity & does not feel winded (I suggested to him that this points to the fact that he is not pushing hard enough to get  any aerobic benefit);  I told him that the only explanation that I have for the dyspnea that occurs when he ties his shoes is that he is holding his breath when he bends over & doing a valsalva maneuver- I described this & modeled it for him, then showed how to avoid the dyspnea by breathing normally or crossing his legs and NOT bending over);  He also notes that he still plays golf & does fairly well- limited by knee & back pain, not his breathing => we discussed reassessing his resp status w/ CXR, ambulatory oximetry, Labs... EPIC REVIEW> he had the same compliants in 2012 w/ eval by DrMcLean & Full PFTs were normal then, they changed Coreg to Bystolic & no diff there either...  We reviewed the following medical problems during today's office visit >>     AR> on Zyrtek; he has mild seasonal symptoms... He has AlbutHFA for prn use but he is not wheezing & we've been unable to diagnose any airway dis; Note: PFTs & CXR were wnl; he quit smoking in the 1960s....    HBP> on [id number], JJOAC16, Bystolic5-1/2tab/d; Hx of ACE cough in past; BP= 110/70 & he denies CP, palpit, dizzy, edema; he has DOE that is surely multifactorial in nature & intermittent over the yrs...    CAD, s/pNSTEMI> on ASA81 & Plavix75; he had f/u DrMcLean 7/17 (note reviewed)> Hx NSTEMI w/ PCI to LAD & RCA in 2010; Myoview 6/12 was neg for ischemia & 2DEcho showed EF=45-50% (stable, no change); repeat cath 8/12 showed patent stents and nonobstructive dis; his dyspnea was sl better after switching Coreg to Bystolic; he remains active & the DOE seems multifactorial...    Hyperlipid> on Cres40 + FishOil; last FLP 4/17 showed TChol 108, TG 124, HDL 37, LDL 46 => much improved continue same med + diet/ exercise...    GERD> on Pepcid20Bid; he denies abd pain, n/v, c/d,  blood seen... DrJacobs saw him 6/17- GERD, Divertics, IBS, prob Gilbert's syndrome- AbdSonar 11/2015 showed Gallstone, prob hepatic steatosis, bilat renal cysts.    Divertics,  IBS> followed by DrJacobs and he has never had a polyp but +FamHx colon ca in his mother; last colonoscopy 1/05 (DrLeB) showed mod severe divertics, no polyp; he had f/u w/ DrJacobs 1/15 & they agreed to suspend f/u colons.    Hx BPH, elev PSA, bilat renal cysts, ED> on Uroxatrol10, Proscar5, Viagra100; followed by DrEskridge; seen 7/17 and stable w/ PSA reported normal per pt...    DJD, LBP> on OTC analgesics as needed; he is followed by [facility] [place] w/ shots in his back periodically which really help per pt...    RLS> on Mirapex0.25 taking 1-2 Qhs but notes incr symptoms and we discussed incr Mirapex0.25 to 3tabs taken about 1H prior to bedtime & he will let me know how well this is working for him...    Anxiety & Insomnia> on Benedryl25 prn; he's been axious since the passing of his wife 5/15- add Xanax 0.5mg  1/2 to 1 tab tid prn...    Thrombocytopenia>  He has had chronically low plat counts but no bleeding diathesis even on his ASA/ Plavix; Plat ct ~100K in 2010, and 85K-108K since then w/ CBC 05/21/16 wnl x Plat= 108K: ?etiology, poss related to FLD w/ steatosis on Sonar, norm LFTs & no signs of cirrhosis... EXAM shows Afeb, VSS, O2sat=97% on RA at rest;  HEENT- neg, mallamapti1;  Chest- clear w/o w/r/r;  Heart- RR w/o m/r/g;  Abd- soft, nontender;  Ext- neg w/o c/c/e;  Neuro- intact...  CXR 05/21/16>  Borderline heart size, atherosclerotic Ao, clear lungs w/ min basilar scarring- NAD...   Ambulatory Oximetery 05/21/16>  O2sat=94% on RA at rest w/ pulse=67/min;  He ambulated 3 Laps (185'ea) w/ lowest O2sat=92% w/ pulse=90/min...  LABS 05/21/16>  Chems- wnl;  CBC- ok x plat=108K;  TSH=3.22... IMP/PLAN>>  Willie Lynch is quite concerned about this dyspnea as he perceives that it is worse than last yr but has really been a complaint for many yrs- as noted he exercises 5d/wk at gym on treadmill & bike but only get HR up into the 90s & not pushing very hard; CXR is clear, no signif desat w/  ambulation, labs are OK x mild thrombocytopenia (chr issue); prev spirometry was wnl & he has no hx lung dis w/ neg CT Chest in 2006;  I have recommended a CT ANGIO CHEST for completeness, and if this is ok then ask DrMcLean for recheck & to see if he can get him into a cardiac rehab program to advance his exercise tolerance...   ADDENDUM>>  CT Angio CHEST 05/26/16 showed NEG for PTE, +thoracic aortic calcif & LAD/ RCA calcif w/ stent; no adenopathy, sm HH, tiny 24mm right lung nodule (unchanged from 2006); tiny gallstones, renal cyst, DJD...    ~  April 21, 2018:  2 year ROV & pulmonary follow up visit>  Willie Lynch has established Primary Care w/ DrJKoberlein at the [facility] office and was seen recently- 03/30/18 for new patient visit;  He tells me today that his granddaughter has greduated from [employer] & is doing her internship in [place] training to be a Hospitalist;  He returns today for a 27yr follow up visit- overall feeling well, no new complaints or concerns, notes bilat shoulder arthritis & not playing much golf as a result, but still exercising regularly at a gym ~5d/wk;  He  wants [place] to refill his Mirapex that he takes for RLS...  We reviewed the following interval medical notes in Epic-EMR>      He saw UROLOGY- DrEskridge on 01/04/17>  Hx BPH & elev PSA, on Finasteride5, intol to Flomax, hx micro hematuria, hx OB, nocturia;  Most recent PSA=1.70;  They added UIroxatrol10 to his meds...     He saw CARDS- DrMcLean on 08/24/17>  Known CAD- s/p NSTEMI in 2010 w/ PCI to LAD & RCA;  On ASA81, NIDPOE42, Bystolic5, PNTIR44, RXVQM08, Crestor40;  In 2012 he had trouble with exertional dyspnea=  ETT-myoview in 6/12 showed no ischemia or infarction. Echo showed EF 45-50% (stable from prior). Dyspnea was intermittent- repeat cath (8/12) showed nonobstructive disease with patent stents.  BNP has not been elevated. PFTs in 10/12 were normal. B-blocker rx was adjusted; He developed atypical chest pain in 4/17  and was admitted overnight=> Lexiscan Cardiolite showed no ischemia or infarction (symptoms were probably GI-related).  He is doing well generally, mild dyspnea walking up hill but more limited by arthritis. He has bilateral knee pain and back pain.  Gets steroid shots in back and knees regularly which help. Walks for 20-30 minutes on treadmill or rides exercise bike & no dyspnea/ chest pain/ palpitations/ etc;  BP was elv & Losartan incr to 100, HCTZ12.5 added... He declined [facility] help w/ med adherence...    He saw ORTHO- DrRamos/ [place] for bilat shoulder pain & bilat knee pain in spring 2019 treated w/ steroid shots and viscosupplementation...    He saw PCP- DrKoberline 03/30/18> note reviewed, BP was 130/64, no edema, labs checked (see below)... We reviewed the following medical problems during today's office visit>       AR> on Zyrtek; he has mild seasonal symptoms; he has AlbutHFA for prn use but he is not wheezing & we've been unable to diagnose any airway dis; Note: PFTs & CXR were wnl; he quit smoking in the 1960s...    Multifactorial dyspnea>  Hx neg pulm and cardiac evaluations in the past... he note dyspnea w/ valsalva maneuvers and we discussed this...    HBP> on Bystolic5, QPYPP50, DTOIZ12, HCT12.5; Hx of ACE cough in past; BP= 128/62 & he denies CP, palpit, dizzy, edema; he has DOE that is surely multifactorial in nature & intermittent over the yrs...    CAD, s/p NSTEMI 2010> on ASA81 & Plavix75; he had f/u DrMcLean 08/2017 (note reviewed)> Hx NSTEMI w/ PCI to LAD & RCA in 2010; Myoview 6/12 was neg for ischemia & 2DEcho showed EF=45-50% (stable, no change); repeat cath 8/12 showed patent stents and nonobstructive dis; his dyspnea was sl better after switching Coreg to Bystolic; he remains active & the DOE seems multifactorial, no CP reported..    Hyperlipid> on Cres40 + FishOil; last FLP 08/2017 showed TChol 125, TG 82, HDL 44, LDL 65 => much improved & rec to continue same med + diet/  exercise...    GERD> on Pepcid20 prn; he denies abd pain, n/v, c/d, blood seen... DrJacobs saw him 6/17- GERD, Divertics, IBS, prob Gilbert's syndrome- AbdSonar 11/2015 showed Gallstone, prob hepatic steatosis, bilat renal cysts.    Divertics, IBS> followed by DrJacobs and he has never had a polyp but +FamHx colon ca in his mother; last colonoscopy 1/05 (DrLeB) showed mod severe divertics, no polyp; he had f/u w/ DrJacobs 1/15 & they agreed to suspend f/u colons.    Hx BPH, elev PSA, bilat renal cysts, ED> on Uroxatrol10, Proscar5; followed by DrEskridge;  seen 12/2016 and stable w/ PSA reported normal per pt...    DJD, LBP> on OTC analgesics as needed; he is followed by [facility] w/ shots in his back/ shoulders/ knees periodically which really help per pt; given viscosupplementation 2019.    RLS> on Mirapex0.25 taking 1-2 Qhs but notes incr symptoms and we discussed incr Mirapex0.25 to 3tabs taken about 1H prior to bedtime & he will let me know how well this is working for him...    Anxiety & Insomnia> on Benedryl25 prn; he's been axious since the passing of his wife 5/15- add Xanax 0.5mg  1/2 to 1 tab tid prn...    Thrombocytopenia>  He has had chronically low plat counts but no bleeding diathesis even on his ASA/ Plavix; Plat ct ~100K in 2010, and 85K-108K since then w/ CBC 03/2018 wnl x Plat= 119K: ?etiology, poss related to FLD w/ steatosis on Sonar, norm LFTs & no signs of cirrhosis... EXAM shows Afeb, VSS w/ BP=128/62, O2sat=98% on RA at rest;  HEENT- neg, mallamapti1;  Chest- clear w/o w/r/r;  Heart- RR w/o m/r/g;  Abd- soft, nontender;  Ext- neg w/o c/c/e;  Neuro- intact...  EKG 08/2107 by DrMcLean showed NSR, rate58, wnl, NAD...   LABS 03/2018 by DrKoberlein>  Chems- wnl w/ K=3.8, BS=94, Cr=0.71, LFTs wnl;  CBC- ok x Plat=119K, Hg=14.1, WBC=5.9 norm diff... Last TSH = 3.22 (04/2016) IMP/PLAN>>  Willie Lynch has mult medical issues as noted above, currently stable on Rx as indicated;  we reviewed dyspnea related to valsalva maneuvers and ways to avoid this problem; we refilled his Mirapex for RLS today per request;  We discussed my upcoming retirement & he will maintain follow up w/ DrKoberlein, DrMcLean, DrEskridge, Tempie Donning, and he knows that he can call [facility] as needed in the future.          Problem List:   GLAUCOMA>  On eye drops from [facility]...  ALLERGIC RHINITIS (ICD-477.9) - on ZYRTEK 10mg  prn, Benedryl prn... ~  CT Chest 08/15/04 showed norm heart size & config, mild aortic elongation/ no aneurysm, clear lungs x mild diffuse peribronch thickening- no effusion or pneumothorax, no adenopathy, abd- neg x simple cysts in both kidneys...  ~  CXR 8/12 showed borderline heart size, ectasia & calcif thorAo; clear lungs, mild degen spondylosis, prev right shoulder surg... ~  FullPFT 03/23/11 showed FVC=4.26 (100), FEV1=3.17 (114%), %1sec=74, mid-flows wnl at 99% predicted; no change post bronchodil; TLC=6.40 (99%), RV=2.13 (81%), RV/TLC=33;  DLCO=83% predicted...  ~  Last CXR was 12/31/14 showing norm heart size, clear lungs/ NAD, mild DJD in Tspine...  ~  Spirometry 06/13/15 showed FVC=4.08 (94%), FEV1=3.26 (100%), %1sec=80, mid-flows are wnl at 120% predicted=> the numbers are normal, the tracing showed some difficulty w/ the testing procedure.  Hx HBP w/ ACE cough>  On LOSARTAN 50mg /d & COREG 6.25mg  Bid...  BP= 120/68 & he denies CP or palpit, but as noted is c/o no energy/ feeling poorly/ "indigestion" etc. ~  CXR 5/10 showed tort thor Ao, clear lungs, post op changes right shoulder ~  2DEcho 6/12 showed norm wall thickness, reduced EF=45-50% w/ HK basal, inferior, lateral walls; Gr1DD, mild AI.Marland Kitchen. ~  5/15:  on [id number] & back on Bystolic5-1/2tab/d; Hx of ACE cough in past; BP= 116/66 & he denies CP, palpit, SOB, edema.. ~  Last 2DEcho 01/31/15 showed mild LVH, norm LVF w/ EF=50-55%, mild infer HK, Gr1DD, AoV sclerosis, mild AI, triv MR & calcif annulus, no real change  from 2012.Marland KitchenMarland Kitchen  CAD/ NSTEMI 5/10 w/ PCI LAD & RCA>  Followed regularly by DrMcLean for [facility] on ASA 81mg /d, PLAVIX 75mg /d, plus the Coreg, Losartan, Crestor, NTG prn. Ischemic Cardiomyopathy w/ diffuse HK on Echo 8/10 & EF= 45-50%, mild AI/ MR... ~  Hosp 5/10 by DrMcLean/ Cards> CAD & NSTEMI w/ Rotational atherectomy/PCI with Xience DES x 3 to RCA and rotational atherectomy/PCI with Xience DES to proximal LAD.  ~  EKG 6/12 showed SBrady, rate 58/min, otherw WNL... ~  1/14: [facility] f/u by DrMcLean> hx NSTEMI 5/10 w/ PCI to LAD & RCA; c/o exertional dyspnea> ETT-Myoview 6/12 was neg w/o ischemia or infarction; 2DEcho 6/12 w/ EF=45-50% (no ch from prev); repeat cath 8/12 showed nonobstructive dis w/ patent stents; Dyspnea ultimately believed due to BBlockers as he had difficulty w/ Coreg & Bystolic- better off these meds... EKG showed SBrady, rate56, wnl, NAD.Marland Kitchen. ~  5/15: on ASA81 & Plavix75; he had f/u DrMcLean 1/15> Hx NSTEMI w/ PCI to LAD & RCA in 2010; Myoview 6/12 was neg for ischemia & 2DEcho showed EF=45-50% (stable, no change); repeat cath 8/12 showed patent stents and nonobstructive dis. ~   8/16: f/u DrMcLean (note reviewed)> Hx NSTEMI w/ PCI to LAD & RCA in 2010; Myoview 6/12 was neg for ischemia & 2DEcho showed EF=45-50% (stable, no change); repeat cath 8/12 showed patent stents and nonobstructive dis; his dyspnea was sl better after switching Coreg to Bystolic; he remains active & the DOE seems multifactorial.  HYPERLIPIDEMIA>  On diet + CRESTOR 40mg /d and FISH OIL 1000mg  Bid... ~  FLP 10/11 on Cres40 showed TChol 112, TG 100, HDL 34, LDL 58 ~  FLP 7/12 on Cres40 showed TChol 123, TG 68, HDL 46, LDL 64 ~  FLP 8/13 on Cres40 showed TChol 108, TG 104, HDL 42, LDL 45  ~  FLP 1/15 on Cres40 showed TChol 136, TG 180, HDL 31, LDL 69  ~  FLP 1/16 on Cres40 showed TChol 125, TG 83, HDL 49, LDL 60  GERD/ Esophagitis>  On PEPCID which he takes Bid & not on PPI due to his Plavix rx... ~  Labs 6/12  showed Hg= 14.7, MCV= 93... ~  CBC has remained wnl...   DIVERTICULOSIS OF COLON (ICD-562.10) - last colon 11/05 by DrSamLeB w/ divertics only... He saw DrJacobs 10/11 & they decided on f/u colon in 2015 despite family hx of colon cancer in mother since he has never had a polyp etc... Hx of IRRITABLE BOWEL SYNDROME (ICD-564.1) - he denies recent abd pain, n/v, change in bowels, etc... ~  1/15: he saw DrJacobs> divertics, neg colon 2005, on Plavix- they decided to forgo any further colon cancer screening...   Hx of ELEVATED PROSTATE SPECIFIC ANTIGEN (ICD-790.93) - followed by DrPeterson for Urology on UROXATRAL 10mg /d, & PROSCAR 5mg /d;  He tells me that DrPeterson follows his PSAs, we do not have any recent notes from Urology. Hx of RENAL CYST (ICD-593.2) ~  10/13: he had f/u DrEskridge> BPH w/ BOO, elev PSA, ED; stable on [place]; he tells me his PSA was "fine"...  OSTEOARTHRITIS (ICD-715.90) LOW BACK PAIN, CHRONIC (ICD-724.2) - severe back discomfort w/ eval by ortho (DrOlin & Ramos) resulting in epid steroid shots, a round of prednisone, and percocet + advil for pain...   Hx of MIGRAINE HEADACHE (ICD-346.90) - he denies recent migraine problem...  INSOMNIA>  On BENEDRYL 25mg  prn use...  HEALTH MAINTENANCE: ~  GI:  Dr Ardis Hughs follows & f/u colon due 2015... ~  GU:  Followed by DrPeterson & he does his PSAs... ~  Immunizations:  He received Shingles vaccine in 2011   Past Medical History:  Diagnosis Date  . Abdominal pain, unspecified site   . Acquired cyst of kidney   . Allergic rhinitis, cause unspecified   . CAD (coronary artery disease)    NSTEMI 5/10. Rotational atherectomy/PCI w Xience DES x 3 to RCA and rotation atherectomy /PCI w Xience DES to prox LAD  . Diverticulosis of colon (without mention of hemorrhage)    last colon 11/05 by DrSamLeB w divertics only  . Elevated prostate specific antigen (PSA)    followed by Dr Terance Hart for urology  . GERD  (gastroesophageal reflux disease)    esophagitis  . Hyperlipidemia   . Hypertension    ACEI cough  . Irritable bowel syndrome   . Ischemic cardiomyopathy    mild echo (8/10) w EF 45-50%, diffuse hypokinesis, mild AI and mild MR  . Lumbago   . Migraine, unspecified, without mention of intractable migraine without mention of status migrainosus   . Osteoarthrosis, unspecified whether generalized or localized, unspecified site     Past Surgical History:  Procedure Laterality Date  . ANGIOPLASTY    . bilat inguinal hernia repairs  7/06   Dr Hassell Done  . CARDIAC CATHETERIZATION    . CORONARY ANGIOPLASTY     x4  . rotator cuff surgery     Dr. Shellia Carwin     Outpatient Encounter Medications as of 04/21/2018  Medication Sig  . alfuzosin (UROXATRAL) 10 MG 24 hr tablet Take 10 mg by mouth daily.    Marland Kitchen amLODipine (NORVASC) 10 MG tablet TAKE 1 TABLET BY MOUTH EVERY DAY, NEED TO CALL OFFICE FOR FURTHER REFILLS  . aspirin (ASPIR-LOW) 81 MG EC tablet Take 162 mg by mouth daily.   . cetirizine (ZYRTEC ALLERGY) 10 MG tablet Take 10 mg by mouth as needed.    . clopidogrel (PLAVIX) 75 MG tablet Take 1 tablet (75 mg total) by mouth daily.  . famotidine (PEPCID) 20 MG tablet Take 1 tablet (20 mg total) by mouth daily as needed for heartburn or indigestion.  . finasteride (PROSCAR) 5 MG tablet Take 5 mg by mouth daily.    Marland Kitchen losartan (COZAAR) 50 MG tablet Take 1 tablet (50 mg total) by mouth daily.  . Multiple Vitamins-Minerals (MULTIVITAL) tablet Take 1 tablet by mouth daily.    . nebivolol (BYSTOLIC) 5 MG tablet Take 1 tablet (5 mg total) by mouth daily.  . nitroGLYCERIN (NITROSTAT) 0.4 MG SL tablet DISSOLVE 1 TABLET UNDER THE TONGUE IF NEEDED AS DIRECTED.  . Omega-3 Fatty Acids (FISH OIL) 1000 MG CAPS Take 1 capsule by mouth daily.   . pramipexole (MIRAPEX) 0.5 MG tablet Take 3 tabs 1 hour before bedtime  . rosuvastatin (CRESTOR) 40 MG tablet Take 1 tablet (40 mg total) by mouth daily.  . timolol  (TIMOPTIC-XR) 0.5 % ophthalmic gel-forming Place 1 drop into both eyes every morning.  . TRAVATAN Z 0.004 % SOLN ophthalmic solution Place 1 drop into both eyes every evening.  . [DISCONTINUED] pramipexole (MIRAPEX) 0.5 MG tablet take 2 to 3 tablets by mouth at bedtime  . [DISCONTINUED] pramipexole (MIRAPEX) 0.5 MG tablet Take 2 tabs at bedtime  . [DISCONTINUED] pramipexole (MIRAPEX) 0.5 MG tablet Take 3 tabs 1 hour before bedtime  . hydrochlorothiazide (MICROZIDE) 12.5 MG capsule Take 1 capsule (12.5 mg total) by mouth daily.   No facility-administered encounter medications on file as of 04/21/2018.  Allergies  Allergen Reactions  . Amoxicillin-Pot Clavulanate     REACTION: pt developed HIVES on augmentin  . Codeine   . Morphine    ACE inhibitors:  Lisinopril w/ cough...     Current Medications, Allergies, Past Medical History, Past Surgical History, Family History, and Social History were reviewed in [employer] record.    Review of Systems       See HPI - all other systems neg except as noted...      The patient denies anorexia, fever, weight loss, weight gain, vision loss, decreased hearing, hoarseness, chest pain, syncope, peripheral edema, prolonged cough, hemoptysis, abdominal pain, melena, hematochezia, severe indigestion/heartburn, hematuria, incontinence, muscle weakness, suspicious skin lesions, transient blindness, difficulty walking, depression, unusual weight change, abnormal bleeding, enlarged lymph nodes, and angioedema.     Objective:   Physical Exam    WD, WN, 84 y/o WM in NAD...  GENERAL:  Alert & oriented; pleasant & cooperative. HEENT:  [place]/AT, EOM-wnl, PERRLA, EACs-clear, TMs-wnl, NOSE-clear, THROAT-clear & wnl. NECK:  Supple w/ full ROM; no JVD; normal carotid impulses w/o bruits; no thyromegaly or nodules palpated; no lymphadenopathy. CHEST:  Clear to P & A; without wheezes/ rales/ or rhonchi. HEART:  Regular Rhythm; without  murmurs/ rubs/ or gallops. ABDOMEN:  Soft & nontender; normal bowel sounds; no organomegaly or masses detected. EXT: without deformities, mild arthritic changes; no varicose veins/ venous insuffic/ or edema. NEURO:  CN's intact; motor testing normal; sensory testing normal; gait normal & balance OK. DERM:  No lesions noted; no rash etc...  RADIOLOGY DATA:  Reviewed in the EPIC EMR & discussed w/ the patient...  LABORATORY DATA:  Reviewed in the EPIC EMR & discussed w/ the patient...   Assessment & Plan:   04/21/18>   Willie Lynch has mult medical issues as noted above, currently stable on Rx as indicated; we reviewed dyspnea related to valsalva maneuvers and ways to avoid this problem; we refilled his Mirapex for RLS today per request;  We discussed my upcoming retirement & he will maintain follow up w/ DrKoberlein, DrMcLean, DrEskridge, Tempie Donning, and he knows that he can call [facility] as needed in the future  HBP> CAD/ s/p NSTEMI 5/10 w/ PTCA & stents/ Ischemic Cardiomyopathy>  Followed by DrMcLean & stable...  Multifactorial dyspnea> as noted & we reviewed techniques for avoiding valsalvas...  CHOL>  Stable on the [place]40...  GI Symptoms/ GERD/ IBS/ Gas>  Followed by DrJacobs, on Pepcid Bid & add Mylicon, other Simethacone containing gas therapies...  GU>  Hx elev PSA>  All followed by DrEskridge...  LBP/ DJD>  Followed by DrOlin & Ramos...  RLS>  We discussed increasing the MIRAPEX0.25 to 3 tabs taken 1H prior to bedtime...  Anxiety>  Prev used alprazolam, off now...   Patient's Medications  New Prescriptions   No medications on file  Previous Medications   ALFUZOSIN (UROXATRAL) 10 MG 24 HR TABLET    Take 10 mg by mouth daily.     AMLODIPINE (NORVASC) 10 MG TABLET    TAKE 1 TABLET BY MOUTH EVERY DAY, NEED TO CALL OFFICE FOR FURTHER REFILLS   ASPIRIN (ASPIR-LOW) 81 MG EC TABLET    Take 162 mg by mouth daily.    CETIRIZINE (ZYRTEC ALLERGY) 10 MG TABLET    Take 10 mg by mouth as  needed.     CLOPIDOGREL (PLAVIX) 75 MG TABLET    Take 1 tablet (75 mg total) by mouth daily.   FAMOTIDINE (PEPCID) 20 MG TABLET  Take 1 tablet (20 mg total) by mouth daily as needed for heartburn or indigestion.   FINASTERIDE (PROSCAR) 5 MG TABLET    Take 5 mg by mouth daily.     HYDROCHLOROTHIAZIDE (MICROZIDE) 12.5 MG CAPSULE    Take 1 capsule (12.5 mg total) by mouth daily.   LOSARTAN (COZAAR) 50 MG TABLET    Take 1 tablet (50 mg total) by mouth daily.   MULTIPLE VITAMINS-MINERALS (MULTIVITAL) TABLET    Take 1 tablet by mouth daily.     NEBIVOLOL (BYSTOLIC) 5 MG TABLET    Take 1 tablet (5 mg total) by mouth daily.   NITROGLYCERIN (NITROSTAT) 0.4 MG SL TABLET    DISSOLVE 1 TABLET UNDER THE TONGUE IF NEEDED AS DIRECTED.   OMEGA-3 FATTY ACIDS (FISH OIL) 1000 MG CAPS    Take 1 capsule by mouth daily.    ROSUVASTATIN (CRESTOR) 40 MG TABLET    Take 1 tablet (40 mg total) by mouth daily.   TIMOLOL (TIMOPTIC-XR) 0.5 % OPHTHALMIC GEL-FORMING    Place 1 drop into both eyes every morning.   TRAVATAN Z 0.004 % SOLN OPHTHALMIC SOLUTION    Place 1 drop into both eyes every evening.  Modified Medications   Modified Medication Previous Medication   PRAMIPEXOLE (MIRAPEX) 0.5 MG TABLET pramipexole (MIRAPEX) 0.5 MG tablet      Take 3 tabs 1 hour before bedtime    take 2 to 3 tablets by mouth at bedtime  Discontinued Medications   PRAMIPEXOLE (MIRAPEX) 0.5 MG TABLET    Take 2 tabs at bedtime

## 2018-04-21 NOTE — Patient Instructions (Signed)
Today we updated your med list in our EPIC system...    Continue your current medications the same...    I am delighted to see that you are doing so well!  We refilled your MIRAPEX 0.5mg  - 3 tabs taken about 1Hr before bedtime for restless legs...  I will send a note to DrKobelein, asking her to take over the Mirapex refills down the road...  Willie Lynch,  It has been my honor to have been one of your families' doctors over these past many years!    Sending you my best wishes for good health & much happiness in the years to come.Marland KitchenMarland Kitchen

## 2018-04-29 DIAGNOSIS — H401133 Primary open-angle glaucoma, bilateral, severe stage: Secondary | ICD-10-CM | POA: Diagnosis not present

## 2018-05-21 ENCOUNTER — Other Ambulatory Visit: Payer: Self-pay | Admitting: Cardiology

## 2018-06-28 ENCOUNTER — Ambulatory Visit (INDEPENDENT_AMBULATORY_CARE_PROVIDER_SITE_OTHER): Payer: Medicare Other | Admitting: Primary Care

## 2018-06-28 ENCOUNTER — Encounter: Payer: Self-pay | Admitting: Primary Care

## 2018-06-28 VITALS — BP 118/78 | HR 89 | Temp 98.8°F | Ht 70.0 in | Wt 201.8 lb

## 2018-06-28 DIAGNOSIS — J069 Acute upper respiratory infection, unspecified: Secondary | ICD-10-CM | POA: Diagnosis not present

## 2018-06-28 MED ORDER — AZITHROMYCIN 250 MG PO TABS: ORAL_TABLET | ORAL | 0 refills | Status: DC

## 2018-06-28 NOTE — Progress Notes (Signed)
@Patient  ID: Willie Lynch, male    DOB: 08/22/1933, 85 y.o.   MRN: [id number]  Chief Complaint  Patient presents with  . Acute Visit    States he had laryngitis over the christmas holiday and slowly he became more SOB. He states it started in his head and has now moved down into his chest.     Referring provider: [name], MD  HPI: 85 year old male, former smoker quit in 1963. PMH significant for allergic rhinitis. Patient of Dr. Lenna Gilford, last seen on 04/21/18. He has established primary care with Dr. Ethlyn Gallery at the [place] office and was seen on 03/30/18.   No history of lung diease. Previous CXR clear, no significant desaturation on ambulation, spirometry normal.  Negative CT chest in 2006. CTA in 2017 showed no PE, +thoracic aortic calcif & LAD/RCA calcif w/stent; no adenopathy, sm HH, tiny 4mm lung nodule unchanged from 2006.    06/28/2018 Patient presents today with acute complaints of head and chest congestion x 10 days. States that he had laryngitis over the holiday which then turned into a head cold.  He is not taking anything over-the-counter except for Tylenol as needed.  Coughing very little amount. Some shortness of breath which is his baseline and nothing new. No fever. Denies sinus pain, wheezing.  Testing: Spirometry 06/13/15- FVC 4.08 (94%), FEV1 3.26 (100%), ratio 80, mid-flow wnl  Allergies  Allergen Reactions  . Amoxicillin-Pot Clavulanate     REACTION: pt developed HIVES on augmentin  . Codeine   . Morphine     Immunization History  Administered Date(s) Administered  . Influenza Split 04/09/2011, 04/07/2013  . Influenza Whole 03/21/2008  . Influenza, High Dose Seasonal PF 04/09/2014, 04/20/2016, 03/31/2017, 04/21/2018  . Influenza-Unspecified 03/23/2015    Past Medical History:  Diagnosis Date  . Abdominal pain, unspecified site   . Acquired cyst of kidney   . Allergic rhinitis, cause unspecified   . CAD (coronary artery disease)    NSTEMI 5/10. Rotational atherectomy/PCI w Xience DES x 3 to RCA and rotation atherectomy /PCI w Xience DES to prox LAD  . Diverticulosis of colon (without mention of hemorrhage)    last colon 11/05 by DrSamLeB w divertics only  . Elevated prostate specific antigen (PSA)    followed by Dr Terance Hart for urology  . GERD (gastroesophageal reflux disease)    esophagitis  . Hyperlipidemia   . Hypertension    ACEI cough  . Irritable bowel syndrome   . Ischemic cardiomyopathy    mild echo (8/10) w EF 45-50%, diffuse hypokinesis, mild AI and mild MR  . Lumbago   . Migraine, unspecified, without mention of intractable migraine without mention of status migrainosus   . Osteoarthrosis, unspecified whether generalized or localized, unspecified site     Tobacco History: Social History   Tobacco Use  Smoking Status Former Smoker  . Last attempt to quit: 06/22/1961  . Years since quitting: 57.0  Smokeless Tobacco Never Used   Counseling given: Not Answered   Outpatient Medications Prior to Visit  Medication Sig Dispense Refill  . alfuzosin (UROXATRAL) 10 MG 24 hr tablet Take 10 mg by mouth daily.      Marland Kitchen amLODipine (NORVASC) 10 MG tablet TAKE 1 TABLET BY MOUTH EVERY DAY, NEED TO CALL OFFICE FOR FURTHER REFILLS 90 tablet 0  . aspirin (ASPIR-LOW) 81 MG EC tablet Take 162 mg by mouth daily.     . cetirizine (ZYRTEC ALLERGY) 10 MG tablet Take 10 mg by  mouth as needed.      . clopidogrel (PLAVIX) 75 MG tablet Take 1 tablet (75 mg total) by mouth daily. 30 tablet 6  . famotidine (PEPCID) 20 MG tablet Take 1 tablet (20 mg total) by mouth daily as needed for heartburn or indigestion. 30 tablet 2  . finasteride (PROSCAR) 5 MG tablet Take 5 mg by mouth daily.      Marland Kitchen losartan (COZAAR) 50 MG tablet Take 1 tablet (50 mg total) by mouth daily. 180 tablet 1  . Multiple Vitamins-Minerals (MULTIVITAL) tablet Take 1 tablet by mouth daily.      . nebivolol (BYSTOLIC) 5 MG tablet Take 1 tablet (5 mg total) by mouth  daily. 45 tablet 3  . nitroGLYCERIN (NITROSTAT) 0.4 MG SL tablet DISSOLVE 1 TABLET UNDER THE TONGUE IF NEEDED AS DIRECTED. 25 tablet 3  . Omega-3 Fatty Acids (FISH OIL) 1000 MG CAPS Take 1 capsule by mouth daily.     . pramipexole (MIRAPEX) 0.5 MG tablet Take 3 tabs 1 hour before bedtime 90 tablet 6  . rosuvastatin (CRESTOR) 40 MG tablet Take 1 tablet (40 mg total) by mouth daily. 90 tablet 3  . timolol (TIMOPTIC-XR) 0.5 % ophthalmic gel-forming Place 1 drop into both eyes every morning.  0  . TRAVATAN Z 0.004 % SOLN ophthalmic solution Place 1 drop into both eyes every evening.  0  . hydrochlorothiazide (MICROZIDE) 12.5 MG capsule Take 1 capsule (12.5 mg total) by mouth daily. 30 capsule 5   No facility-administered medications prior to visit.     Review of Systems  Review of Systems  Constitutional: Negative.   HENT: Positive for congestion and postnasal drip.   Respiratory: Positive for cough and shortness of breath. Negative for wheezing.     Physical Exam  BP 118/78   Pulse 89   Temp 98.8 F (37.1 C) (Oral)   Ht 5\' 10"  (1.778 m)   Wt 201 lb 12.8 oz (91.5 kg)   SpO2 97%   BMI 28.96 kg/m  Physical Exam Constitutional:      General: He is not in acute distress.    Appearance: He is well-developed and normal weight. He is not ill-appearing.  HENT:     Head: Normocephalic and atraumatic.     Nose: Nose normal.     Mouth/Throat:     Mouth: Mucous membranes are moist.     Pharynx: Oropharynx is clear.  Eyes:     Pupils: Pupils are equal, round, and reactive to light.  Neck:     Musculoskeletal: Normal range of motion and neck supple.  Cardiovascular:     Rate and Rhythm: Normal rate and regular rhythm.  Pulmonary:     Effort: Pulmonary effort is normal. No respiratory distress.     Breath sounds: Normal breath sounds. No wheezing.  Skin:    General: Skin is warm and dry.     Findings: No erythema or rash.  Neurological:     Mental Status: He is alert and oriented  to person, place, and time.  Psychiatric:        Behavior: Behavior normal.        Judgment: Judgment normal.      Lab Results:  CBC    Component Value Date/Time   WBC 5.9 03/30/2018 1417   RBC 4.61 03/30/2018 1417   HGB 14.1 03/30/2018 1417   HCT 41.5 03/30/2018 1417   PLT 119.0 (L) 03/30/2018 1417   MCV 90.1 03/30/2018 1417   MCH 31.1 08/24/2017  0924   MCHC 34.0 03/30/2018 1417   RDW 14.2 03/30/2018 1417   LYMPHSABS 1.4 03/30/2018 1417   MONOABS 0.7 03/30/2018 1417   EOSABS 0.2 03/30/2018 1417   BASOSABS 0.0 03/30/2018 1417    BMET    Component Value Date/Time   NA 137 03/30/2018 1417   K 3.8 03/30/2018 1417   CL 102 03/30/2018 1417   CO2 29 03/30/2018 1417   GLUCOSE 94 03/30/2018 1417   BUN 16 03/30/2018 1417   CREATININE 0.71 03/30/2018 1417   CALCIUM 9.6 03/30/2018 1417   GFRNONAA >60 09/17/2017 0826   GFRAA >60 09/17/2017 0826    BNP    Component Value Date/Time   BNP 21.9 09/23/2015 0923    ProBNP    Component Value Date/Time   PROBNP 63.0 12/31/2014 0951    Imaging: No results found.   Assessment & Plan:   URI (upper respiratory infection) - Persistent URI symptoms for 10+ days - Appears well on exam; VSS, afebrile  - Advised mucinex BID for nasal/chest congestion; delsym cough syrup q12 hrs - Will send in [place] for patient to take in 3-5 days if symptoms do not improve with above plan - Return if symptoms worsen in anyway    Martyn Ehrich, NP 06/28/2018

## 2018-06-28 NOTE — Assessment & Plan Note (Addendum)
-   Persistent URI symptoms for 10+ days - Appears well on exam; VSS, afebrile  - Advised mucinex BID for nasal/chest congestion; delsym cough syrup q12 hrs - Will send in [place] for patient to take in 3-5 days if symptoms do not improve with above plan - Return if symptoms worsen in anyway

## 2018-06-28 NOTE — Patient Instructions (Signed)
Take Mucinex twice daily for the next 5 days as needed for nasal/chest congestion Delsym cough syrup 5 to 10 mL's every 12 hours as needed for cough  Make sure to get plenty of rest and stay well-hydrated  We will send Z-Pak for you to have on hand, please start in 3 to 5 days if not feeling better  Call or return if above plan does not improve symptoms or if they worsen in any way

## 2018-07-19 ENCOUNTER — Other Ambulatory Visit (HOSPITAL_COMMUNITY): Payer: Self-pay

## 2018-07-19 MED ORDER — HYDROCHLOROTHIAZIDE 12.5 MG PO CAPS: 12.5000 mg | ORAL_CAPSULE | Freq: Every day | ORAL | 5 refills | Status: DC

## 2018-07-27 ENCOUNTER — Other Ambulatory Visit: Payer: Self-pay | Admitting: Pulmonary Disease

## 2018-08-25 ENCOUNTER — Other Ambulatory Visit: Payer: Self-pay | Admitting: Pulmonary Disease

## 2018-08-30 ENCOUNTER — Other Ambulatory Visit: Payer: Self-pay | Admitting: Cardiology

## 2018-09-01 ENCOUNTER — Other Ambulatory Visit: Payer: Self-pay | Admitting: Family Medicine

## 2018-09-01 NOTE — Telephone Encounter (Signed)
Copied from [place] [id number]. Topic: Quick Communication - Rx Refill/Question >> Sep 01, 2018  3:04 PM Reyne Dumas L wrote: Medication: pramipexole (MIRAPEX) 0.5 MG tablet  Has the patient contacted their pharmacy? Yes - states that the doctor that used to fill this is retired.  Pt states he is completely out (Agent: If no, request that the patient contact the pharmacy for the refill.) (Agent: If yes, when and what did the pharmacy advise?)  Preferred Pharmacy (with phone number or street name): [id number] [address], [place] [phone] (Phone) [phone] (Fax)  Agent: Please be advised that RX refills may take up to 3 business days. We ask that you follow-up with your pharmacy.

## 2018-09-01 NOTE — Telephone Encounter (Signed)
Requested medication (s) are due for refill today: Yes  Requested medication (s) are on the active medication list: Yes  Last refill:  04/21/18 by another provider  Future visit scheduled: Yes  Notes to clinic:  Last filled by another provider     Requested Prescriptions  Pending Prescriptions Disp Refills   pramipexole (MIRAPEX) 0.5 MG tablet 90 tablet 6    Sig: Take 3 tabs 1 hour before bedtime     Neurology:  Parkinsonian Agents Passed - 09/01/2018  3:18 PM      Passed - Last BP in normal range    BP Readings from Last 1 Encounters:  06/28/18 118/78         Passed - Valid encounter within last 12 months    Recent Outpatient Visits          5 months ago [employer]support/administrative officer at [employer], Steele Berg, MD      Future Appointments            In 3 weeks Aundra Dubin, Elby Showers, MD [place]   In 4 weeks Koberlein, Steele Berg, MD [employer] at [place], [facility]

## 2018-09-02 MED ORDER — PRAMIPEXOLE DIHYDROCHLORIDE 0.5 MG PO TABS: ORAL_TABLET | ORAL | 6 refills | Status: DC

## 2018-09-02 NOTE — Telephone Encounter (Signed)
Last fill 04/21/18 Dr. Nicki Reaper Last OV 06/28/2018 Ok to fill?

## 2018-09-02 NOTE — Telephone Encounter (Signed)
Copied from [place] [id number]. Topic: Quick Communication - Rx Refill/Question >> Sep 02, 2018  9:42 AM Alanda Slim E wrote: Medication: pramipexole (MIRAPEX) 0.5 MG tablet - Pt is completely out and needs today if at all possible / Med from Dr. Lenna Gilford that is retired and Pt now has Dr. Ethlyn Gallery to refill   Has the patient contacted their pharmacy? Yes - call PCP   Preferred Pharmacy (with phone number or street name): [id number] [address], [place] [phone] (Phone) [phone] (Fax)    Agent: Please be advised that RX refills may take up to 3 business days. We ask that you follow-up with your pharmacy.

## 2018-09-21 ENCOUNTER — Ambulatory Visit (HOSPITAL_COMMUNITY)
Admission: RE | Admit: 2018-09-21 | Discharge: 2018-09-21 | Disposition: A | Discharge status: Home / Self Care | Payer: Medicare Other | Source: Ambulatory Visit | Attending: Cardiology | Admitting: Cardiology

## 2018-09-21 ENCOUNTER — Other Ambulatory Visit: Payer: Self-pay

## 2018-09-21 DIAGNOSIS — I10 Essential (primary) hypertension: Secondary | ICD-10-CM

## 2018-09-21 DIAGNOSIS — I2510 Atherosclerotic heart disease of native coronary artery without angina pectoris: Secondary | ICD-10-CM

## 2018-09-21 DIAGNOSIS — E7800 Pure hypercholesterolemia, unspecified: Secondary | ICD-10-CM

## 2018-09-21 NOTE — Patient Instructions (Signed)
F/u labs scheduled 6/1/@830   Your physician wants you to follow-up in: [place] will receive a reminder letter in the mail two months in advance. If you don't receive a letter, please call our office to schedule the follow-up appointment.

## 2018-09-22 NOTE — Progress Notes (Signed)
Heart Failure TeleHealth Note  Due to national recommendations of social distancing due to [place] 19, Audio/video telehealth visit is felt to be most appropriate for this patient at this time.  See MyChart message from today for patient consent regarding telehealth for [facility].  Date:  09/22/2018   ID:  Willie Lynch, DOB [birth year], MRN [id number]  Location: Home  Provider location: [address], [place] Type of Visit: Established patient  PCP:  [name], MD  Cardiologist:  Dr. Aundra Dubin  Chief Complaint: Shortness of breath   History of Present Illness: Willie Lynch is a 85 y.o. male who presents via audio/video conferencing for a telehealth visit today.     Today,  he denies symptoms of cough, fevers, chills, or new SOB worrisome for COVID 19.    Patient had NSTEMI in 5/10 with PCI to LAD and RCA.  In 2012, he had trouble with exertional dyspnea.  He did an ETT-myoview in 6/12 showing no ischemia or infarction.  Echo showed EF 45-50% (stable from prior).  Dyspnea improved for a while then worsened again. This time, he had a repeat cath (8/12).  This showed nonobstructive disease with patent stents.  BNP has not been elevated.  PFTs in 10/12 were normal.  I had him stop Coreg and start nebivolol instead with the thought that dyspnea could be related to beta-2 blockade and use of a more selective beta-1 blocker may help.  This seemed to help his dyspnea to a certain extent.   He developed atypical chest pain in 4/17 and was admitted overnight.  Lexiscan Cardiolite showed no ischemia or infarction.  Symptoms were probably GI-related.   I had him try stopping Bystolic to see if this would help his chronic dyspnea.  It made no difference so he restarted the Bystolic.   He is doing well overall.  No chest pain.  He has his chronic pattern of mild exertional dyspnea.  This has actually been better over the last year.  He tries to exercise regularly  (walks). No palpitations or lightheadedness.   Labs (3/11): K 4.2, creatinine 0.7  Labs (4/11): LFTs nromal, LDL 43, HDL 35  Labs (10/11): HDL 34, LDL 58, LFTs normal  Labs (11/11): K 4.1, creatinine 0.7  Labs (6/12): K 4.1, creatinine 0.8, TSH normal, BNP 19 Labs (7/12): LDL 64, HDL 46 Labs (8/12): K 3.9, creatinine 0.8 Labs (2/13): LDL 41, HDL 23 Labs (8/13): LDL 45, HDL 42, K 3.9, creatinine 0.7 Labs (1/15): K 3.9, creatinine 1.0, LDL 69, HDL 31, HCT 40.7 Labs (1/16): K 4.2, creatinine 0.84, HCT 45, LDL 60, HDL 49 Labs (7/16): BNP 63, K 3.8, creatinine 0.79, HCT 42.6 Labs (4/17): LDL 46 Labs (6/17): LFTs normal Labs (11/17): K 4.1, creatinine 0.76, HCT 40.8, TSH normal Labs (2/18): LDL 58, HDL 49 Labs (3/19): LDL 65 Labs (10/19): K 3.8, creatinine 0.7  Past Medical History:  1. CAD: NSTEMI 5/10. Rotational atherectomy/PCI with Xience DES x 3 to RCA and rotational atherectomy/PCI with Xience DES to proximal LAD. ETT-myoview (6/12): 7:33, no ischemic ECG changes, EF 59%, no ischemia or infarction on perfusion images.  Left heart cath (8/12): 60-70% distal CFX, 40% in-stent restenosis in proximal LAD.   - Lexiscan Cardiolite (4/17) with EF 60%, no scar or ischemia.  2. Hyperlipidemia 3. ALLERGIC RHINITIS  4. DIVERTICULOSIS OF COLON - last colon 11/05 w/ divertics only.  5. IRRITABLE BOWEL SYNDROME 6. Hx of ELEVATED PROSTATE SPECIFIC ANTIGEN -  followed by Dr Terance Hart for urology  7. Hx of RENAL CYST  8. OSTEOARTHRITIS   9. LOW BACK PAIN, CHRONIC   10. Hx of MIGRAINE HEADACHE  11. HTN: ACEI cough.  12. Esophagitis/GERD  13. Ischemic cardiomyopathy: Mild. Echo (8/10) with EF 45-50%, diffuse hypokinesis, mild AI and mild MR.  Echo (6/12): EF 45-50%, basal to mid inferoposterior hypokinesis, mild aortic insufficiency, grade I diastolic dysfunction.  LV-gram 8/12 with EF 55%.  Echo (8/16) with EF 50-55%, mild LVH, mildly dilated RV with normal systolic function.  14. Dyspnea: PFTs  normal.  Possible intolerance to Coreg.   Current Outpatient Medications  Medication Sig Dispense Refill  . alfuzosin (UROXATRAL) 10 MG 24 hr tablet Take 10 mg by mouth daily.      Marland Kitchen amLODipine (NORVASC) 10 MG tablet TAKE 1 TABLET BY MOUTH EVERY DAY, NEED TO CALL OFFICE FOR FURTHER REFILLS 90 tablet 0  . aspirin (ASPIR-LOW) 81 MG EC tablet Take 162 mg by mouth daily.     Marland Kitchen azithromycin (ZITHROMAX) 250 MG tablet Zpack taper as directed 6 tablet 0  . cetirizine (ZYRTEC ALLERGY) 10 MG tablet Take 10 mg by mouth as needed.      . clopidogrel (PLAVIX) 75 MG tablet Take 1 tablet (75 mg total) by mouth daily. 30 tablet 6  . famotidine (PEPCID) 20 MG tablet Take 1 tablet (20 mg total) by mouth daily as needed for heartburn or indigestion. 30 tablet 2  . finasteride (PROSCAR) 5 MG tablet Take 5 mg by mouth daily.      . hydrochlorothiazide (MICROZIDE) 12.5 MG capsule Take 1 capsule (12.5 mg total) by mouth daily. 30 capsule 5  . losartan (COZAAR) 50 MG tablet Take 1 tablet (50 mg total) by mouth daily. 180 tablet 1  . Multiple Vitamins-Minerals (MULTIVITAL) tablet Take 1 tablet by mouth daily.      . nebivolol (BYSTOLIC) 5 MG tablet Take 1 tablet (5 mg total) by mouth daily. 45 tablet 3  . nitroGLYCERIN (NITROSTAT) 0.4 MG SL tablet DISSOLVE 1 TABLET UNDER THE TONGUE IF NEEDED AS DIRECTED. 25 tablet 3  . Omega-3 Fatty Acids (FISH OIL) 1000 MG CAPS Take 1 capsule by mouth daily.     . pramipexole (MIRAPEX) 0.5 MG tablet Take 3 tabs 1 hour before bedtime 90 tablet 6  . rosuvastatin (CRESTOR) 40 MG tablet Take 1 tablet (40 mg total) by mouth daily. 90 tablet 3  . timolol (TIMOPTIC-XR) 0.5 % ophthalmic gel-forming Place 1 drop into both eyes every morning.  0  . TRAVATAN Z 0.004 % SOLN ophthalmic solution Place 1 drop into both eyes every evening.  0   No current facility-administered medications for this encounter.     Allergies:   Amoxicillin-pot clavulanate; Codeine; and Morphine   Social History:   The patient  reports that he quit smoking about 57 years ago. He has never used smokeless tobacco. He reports current alcohol use. He reports that he does not use drugs.   Family History:  The patient's family history includes Cancer in his mother and another family member; Heart attack in his father; Heart disease in his father.   ROS:  Please see the history of present illness.   All other systems are personally reviewed and negative.   Exam:  [facility] Call; Exam is subjective and or/visual.) General:   No resp difficulty. Lungs: Normal respiratory effort with conversation.  Abdomen: Non-distended. Pt denies tenderness with self palpation.  Extremities: Pt denies edema. Neuro: Alert &  oriented x 3.   Recent Labs: 03/30/2018: ALT 18; BUN 16; Creatinine, Ser 0.71; Hemoglobin 14.1; Platelets 119.0; Potassium 3.8; Sodium 137  Personally reviewed   Wt Readings from Last 3 Encounters:  06/28/18 91.5 kg (201 lb 12.8 oz)  04/21/18 91.4 kg (201 lb 6.4 oz)  03/30/18 92.1 kg (203 lb 1.6 oz)     ASSESSMENT AND PLAN:  CORONARY ATHEROSCLEROSIS  No obstructive coronary disease on cath 8/12. Continue ASA 81, beta blocker, Crestor, losartan.  Given multiple stents (DES) will continue Plavix.  He has had no bleeding problems. Lexiscan Cardiolite in 4/17 showed no ischemia or infarction. No ischemic symptoms.  Dyspnea  Echo in 8/16 was stable with EF 50-55%, normal Cardiolite in 4/17.  He has had chronic dyspnea.  Stopping Bystolic did not help. Overall, dyspnea has been gradually improving.  HYPERCHOLESTEROLEMIA Good lipids in 3/19.  He will get a repeat in 2 months.    HTN BP has been ok at home.   COVID screen The patient does not have any symptoms that suggest any further testing/ screening at this time.  Social distancing reinforced today.  Recommended follow-up:  1 year  Relevant cardiac medications were reviewed at length with the patient today.   The patient does not have concerns  regarding their medications at this time.   Patient Risk: After full review of this patients clinical status, I feel that they are at moderate risk for cardiac decompensation at this time.  Today, I have spent 15 minutes with the patient with telehealth technology discussing CAD.    Signed, Loralie Champagne, MD  09/22/2018  [facility] [address] Heart and [place] [address] [phone] (office) [phone] (fax)

## 2018-09-23 ENCOUNTER — Ambulatory Visit (HOSPITAL_COMMUNITY): Payer: Medicare Other | Admitting: Cardiology

## 2018-09-25 ENCOUNTER — Other Ambulatory Visit (HOSPITAL_COMMUNITY): Payer: Self-pay | Admitting: Cardiology

## 2018-09-30 ENCOUNTER — Ambulatory Visit: Payer: Medicare Other | Admitting: Family Medicine

## 2018-10-12 ENCOUNTER — Ambulatory Visit: Payer: Medicare Other | Admitting: Family Medicine

## 2018-10-12 ENCOUNTER — Other Ambulatory Visit: Payer: Self-pay

## 2018-10-12 DIAGNOSIS — J309 Allergic rhinitis, unspecified: Secondary | ICD-10-CM

## 2018-10-12 DIAGNOSIS — D696 Thrombocytopenia, unspecified: Secondary | ICD-10-CM

## 2018-10-12 DIAGNOSIS — K219 Gastro-esophageal reflux disease without esophagitis: Secondary | ICD-10-CM

## 2018-10-12 DIAGNOSIS — M150 Primary generalized (osteo)arthritis: Secondary | ICD-10-CM

## 2018-10-12 DIAGNOSIS — I10 Essential (primary) hypertension: Secondary | ICD-10-CM

## 2018-10-12 DIAGNOSIS — N138 Other obstructive and reflux uropathy: Secondary | ICD-10-CM

## 2018-10-12 DIAGNOSIS — E7800 Pure hypercholesterolemia, unspecified: Secondary | ICD-10-CM

## 2018-10-12 DIAGNOSIS — M159 Polyosteoarthritis, unspecified: Secondary | ICD-10-CM

## 2018-10-12 DIAGNOSIS — N401 Enlarged prostate with lower urinary tract symptoms: Secondary | ICD-10-CM

## 2018-10-12 NOTE — Progress Notes (Unsigned)
Virtual Visit via Video Note  I connected with@ on 10/12/18 at  8:30 AM EDT by a video enabled telemedicine application and verified that I am speaking with the correct person using two identifiers.  Location patient: home Location provider:work or home office Persons participating in the virtual visit: patient, provider  I discussed the limitations of evaluation and management by telemedicine and the availability of in person appointments. The patient expressed understanding and agreed to proceed.   MAKHI MUZQUIZ DOB: [birth year] Encounter date: 10/12/2018  This is a 85 y.o. male who presents with No chief complaint on file.   History of present illness: Followed with cardiology 09/22/18:doing well with plans for yearly followup. Ischemic cardiomyopathy, HTN  Following with Dr. Lenna Gilford for pulm needs:multifactorial dyspnea  HPI  Last seen by me in October to establish care. Noted shoulder pain at that time following with ortho Dr. Theda Sers. HTN: HL: Allergies: IBS: GERD: Arthritis: BPH: follows with urology. On proscar daily  Allergies  Allergen Reactions  . Amoxicillin-Pot Clavulanate     REACTION: pt developed HIVES on augmentin  . Codeine   . Morphine    No outpatient medications have been marked as taking for the 10/12/18 encounter (Appointment) with [name], MD.    Review of Systems  Objective:  There were no vitals taken for this visit.      BP Readings from Last 3 Encounters:  06/28/18 118/78  04/21/18 128/62  03/30/18 130/64   Wt Readings from Last 3 Encounters:  06/28/18 201 lb 12.8 oz (91.5 kg)  04/21/18 201 lb 6.4 oz (91.4 kg)  03/30/18 203 lb 1.6 oz (92.1 kg)    EXAM:  GENERAL: alert, oriented, appears well and in no acute distress  HEENT: atraumatic, conjunctiva clear, no obvious abnormalities on inspection of external nose and ears  NECK: normal movements of the head and neck  LUNGS: on inspection no signs of respiratory  distress, breathing rate appears normal, no obvious gross SOB, gasping or wheezing  CV: no obvious cyanosis  MS: moves all visible extremities without noticeable abnormality  PSYCH/NEURO: pleasant and cooperative, no obvious depression or anxiety, speech and thought processing grossly intact ***  Assessment/Plan  There are no diagnoses linked to this encounter.     I discussed the assessment and treatment plan with the patient. The patient was provided an opportunity to ask questions and all were answered. The patient agreed with the plan and demonstrated an understanding of the instructions.   The patient was advised to call back or seek an in-person evaluation if the symptoms worsen or if the condition fails to improve as anticipated.  I provided *** minutes of non-face-to-face time during this encounter.   Micheline Rough, MD

## 2018-11-07 DIAGNOSIS — M19012 Primary osteoarthritis, left shoulder: Secondary | ICD-10-CM | POA: Diagnosis not present

## 2018-11-07 DIAGNOSIS — M19011 Primary osteoarthritis, right shoulder: Secondary | ICD-10-CM | POA: Diagnosis not present

## 2018-11-09 DIAGNOSIS — H5211 Myopia, right eye: Secondary | ICD-10-CM | POA: Diagnosis not present

## 2018-11-09 DIAGNOSIS — H35373 Puckering of macula, bilateral: Secondary | ICD-10-CM | POA: Diagnosis not present

## 2018-11-09 DIAGNOSIS — H5202 Hypermetropia, left eye: Secondary | ICD-10-CM | POA: Diagnosis not present

## 2018-11-09 DIAGNOSIS — H401133 Primary open-angle glaucoma, bilateral, severe stage: Secondary | ICD-10-CM | POA: Diagnosis not present

## 2018-11-21 ENCOUNTER — Other Ambulatory Visit (HOSPITAL_COMMUNITY): Payer: Medicare Other

## 2018-11-22 ENCOUNTER — Other Ambulatory Visit (HOSPITAL_COMMUNITY): Payer: Self-pay

## 2018-11-22 DIAGNOSIS — M19011 Primary osteoarthritis, right shoulder: Secondary | ICD-10-CM | POA: Diagnosis not present

## 2018-11-22 DIAGNOSIS — M19012 Primary osteoarthritis, left shoulder: Secondary | ICD-10-CM | POA: Diagnosis not present

## 2018-11-22 MED ORDER — AMLODIPINE BESYLATE 10 MG PO TABS: ORAL_TABLET | ORAL | 0 refills | Status: DC

## 2018-11-28 ENCOUNTER — Ambulatory Visit (HOSPITAL_COMMUNITY)
Admission: RE | Admit: 2018-11-28 | Discharge: 2018-11-28 | Disposition: A | Discharge status: Home / Self Care | Payer: Medicare Other | Source: Ambulatory Visit | Attending: Cardiology | Admitting: Cardiology

## 2018-11-28 ENCOUNTER — Other Ambulatory Visit: Payer: Self-pay

## 2018-11-28 DIAGNOSIS — I10 Essential (primary) hypertension: Secondary | ICD-10-CM | POA: Insufficient documentation

## 2018-11-28 LAB — LIPID PANEL
Cholesterol: 113 mg/dL (ref 0–200)
HDL: 31 mg/dL — ABNORMAL LOW (ref >40)
LDL Cholesterol: 43 mg/dL (ref 0–99)
Total CHOL/HDL Ratio: 3.6 RATIO
Triglycerides: 194 mg/dL — ABNORMAL HIGH (ref <150)
VLDL: 39 mg/dL (ref 0–40)

## 2018-11-28 LAB — BASIC METABOLIC PANEL
Anion gap: 10 (ref 5–15)
BUN: 14 mg/dL (ref 8–23)
CO2: 23 mmol/L (ref 22–32)
Calcium: 9.5 mg/dL (ref 8.9–10.3)
Chloride: 105 mmol/L (ref 98–111)
Creatinine, Ser: 0.84 mg/dL (ref 0.61–1.24)
GFR calc Af Amer: >60 mL/min (ref >60)
GFR calc non Af Amer: >60 mL/min (ref >60)
Glucose, Bld: 115 mg/dL — ABNORMAL HIGH (ref 70–99)
Potassium: 3.7 mmol/L (ref 3.5–5.1)
Sodium: 138 mmol/L (ref 135–145)

## 2018-11-30 ENCOUNTER — Telehealth (HOSPITAL_COMMUNITY): Payer: Self-pay

## 2018-11-30 DIAGNOSIS — M19012 Primary osteoarthritis, left shoulder: Secondary | ICD-10-CM | POA: Diagnosis not present

## 2018-11-30 DIAGNOSIS — M19011 Primary osteoarthritis, right shoulder: Secondary | ICD-10-CM | POA: Diagnosis not present

## 2018-11-30 NOTE — Telephone Encounter (Signed)
-----   Message from Larey Dresser, MD sent at 11/29/2018  4:03 PM EDT ----- Labs ok except triglycerides higher.  Needs to watch diet.

## 2018-11-30 NOTE — Telephone Encounter (Signed)
Pt aware of lab results. Pt advised to be mindful of diet. Pt verbalized understanding and appreciative

## 2018-12-26 ENCOUNTER — Other Ambulatory Visit (HOSPITAL_COMMUNITY): Payer: Self-pay

## 2018-12-26 MED ORDER — ROSUVASTATIN CALCIUM 40 MG PO TABS: 40.0000 mg | ORAL_TABLET | Freq: Every day | ORAL | 3 refills | Status: DC

## 2019-01-30 DIAGNOSIS — Z85828 Personal history of other malignant neoplasm of skin: Secondary | ICD-10-CM | POA: Diagnosis not present

## 2019-01-30 DIAGNOSIS — D1801 Hemangioma of skin and subcutaneous tissue: Secondary | ICD-10-CM | POA: Diagnosis not present

## 2019-01-30 DIAGNOSIS — L821 Other seborrheic keratosis: Secondary | ICD-10-CM | POA: Diagnosis not present

## 2019-01-30 DIAGNOSIS — D225 Melanocytic nevi of trunk: Secondary | ICD-10-CM | POA: Diagnosis not present

## 2019-01-30 DIAGNOSIS — L570 Actinic keratosis: Secondary | ICD-10-CM | POA: Diagnosis not present

## 2019-02-24 ENCOUNTER — Other Ambulatory Visit (HOSPITAL_COMMUNITY): Payer: Self-pay | Admitting: Cardiology

## 2019-03-02 DIAGNOSIS — L255 Unspecified contact dermatitis due to plants, except food: Secondary | ICD-10-CM | POA: Diagnosis not present

## 2019-03-26 ENCOUNTER — Other Ambulatory Visit: Payer: Self-pay | Admitting: Family Medicine

## 2019-03-30 ENCOUNTER — Other Ambulatory Visit (HOSPITAL_COMMUNITY): Payer: Self-pay | Admitting: Cardiology

## 2019-04-12 ENCOUNTER — Encounter: Payer: Self-pay | Admitting: Family Medicine

## 2019-04-12 ENCOUNTER — Other Ambulatory Visit: Payer: Self-pay

## 2019-04-12 ENCOUNTER — Ambulatory Visit (INDEPENDENT_AMBULATORY_CARE_PROVIDER_SITE_OTHER): Payer: Medicare Other | Admitting: Family Medicine

## 2019-04-12 VITALS — BP 90/50 | HR 75 | Temp 97.7°F | Ht 68.5 in | Wt 194.3 lb

## 2019-04-12 DIAGNOSIS — I10 Essential (primary) hypertension: Secondary | ICD-10-CM | POA: Diagnosis not present

## 2019-04-12 DIAGNOSIS — Z0000 Encounter for general adult medical examination without abnormal findings: Secondary | ICD-10-CM

## 2019-04-12 DIAGNOSIS — E7800 Pure hypercholesterolemia, unspecified: Secondary | ICD-10-CM | POA: Diagnosis not present

## 2019-04-12 DIAGNOSIS — R7301 Impaired fasting glucose: Secondary | ICD-10-CM

## 2019-04-12 NOTE — Progress Notes (Signed)
SUMTER GOZMAN DOB: March 22, 1934 Encounter date: 04/12/2019  This is a 85 y.o. male who presents for complete physical   History of present illness/Additional concerns: Feeling good; no concerns.   Follows with cardiology due to CAD/MI hx (Dr. Aundra Dubin):  At last visit we discussed shoulder pain; was following with Dr. Stacie Glaze ortho. Still having shoulder pain, but not playing golf so it isn't aggravated. He has been waking regularly.   HL: on crestor. Tolerating well.  GERD: stable with pepcid. Doing fine with this. Careful with what he eats.   BPH: follows with urology.  Doesn't check pressures at home.   Blood sugar slightly elevated on bloodwork from June.  States he did have shingles vaccine; states he had it at pharmacy.  Does follow with dermatology regularly. Went 3 months ago. Can't remember name of dermatologist.   Past Medical History:  Diagnosis Date  . Abdominal pain, unspecified site   . Acquired cyst of kidney   . Allergic rhinitis, cause unspecified   . CAD (coronary artery disease)    NSTEMI 5/10. Rotational atherectomy/PCI w Xience DES x 3 to RCA and rotation atherectomy /PCI w Xience DES to prox LAD  . Diverticulosis of colon (without mention of hemorrhage)    last colon 11/05 by [facility] w divertics only  . Elevated prostate specific antigen (PSA)    followed by Dr Terance Hart for urology  . GERD (gastroesophageal reflux disease)    esophagitis  . Hyperlipidemia   . Hypertension    ACEI cough  . Irritable bowel syndrome   . Ischemic cardiomyopathy    mild echo (8/10) w EF 45-50%, diffuse hypokinesis, mild AI and mild MR  . Lumbago   . Migraine, unspecified, without mention of intractable migraine without mention of status migrainosus   . Osteoarthrosis, unspecified whether generalized or localized, unspecified site    Past Surgical History:  Procedure Laterality Date  . ANGIOPLASTY    . bilat inguinal hernia repairs  7/06   Dr Hassell Done  .  CARDIAC CATHETERIZATION    . CORONARY ANGIOPLASTY     x4  . rotator cuff surgery     Dr. Shellia Carwin   Allergies  Allergen Reactions  . Amoxicillin-Pot Clavulanate     REACTION: pt developed HIVES on augmentin  . Codeine   . Morphine    Current Meds  Medication Sig  . alfuzosin (UROXATRAL) 10 MG 24 hr tablet Take 10 mg by mouth daily.    Marland Kitchen amLODipine (NORVASC) 10 MG tablet TAKE 1 TABLET BY MOUTH EVERY DAY  . aspirin (ASPIR-LOW) 81 MG EC tablet Take 162 mg by mouth daily.   . cetirizine (ZYRTEC ALLERGY) 10 MG tablet Take 10 mg by mouth as needed.    . clopidogrel (PLAVIX) 75 MG tablet TAKE 1 TABLET(75 MG) BY MOUTH DAILY  . famotidine (PEPCID) 20 MG tablet Take 1 tablet (20 mg total) by mouth daily as needed for heartburn or indigestion.  . finasteride (PROSCAR) 5 MG tablet Take 5 mg by mouth daily.    . hydrochlorothiazide (MICROZIDE) 12.5 MG capsule TAKE 1 CAPSULE(12.5 MG) BY MOUTH DAILY  . losartan (COZAAR) 50 MG tablet TAKE 1 TABLET(50 MG) BY MOUTH TWICE DAILY  . Multiple Vitamins-Minerals (MULTIVITAL) tablet Take 1 tablet by mouth daily.    . nebivolol (BYSTOLIC) 5 MG tablet Take 1 tablet (5 mg total) by mouth daily.  . nitroGLYCERIN (NITROSTAT) 0.4 MG SL tablet DISSOLVE 1 TABLET UNDER THE TONGUE IF NEEDED AS DIRECTED.  . Omega-3  Fatty Acids (FISH OIL) 1000 MG CAPS Take 1 capsule by mouth daily.   . pramipexole (MIRAPEX) 0.5 MG tablet TAKE 3 TABLETS BY MOUTH EVERY NIGHT 1 HOUR BEFORE BEDTIME (Patient taking differently: TAKE 2 TABLETS BY MOUTH EVERY NIGHT 1 HOUR BEFORE BEDTIME)  . rosuvastatin (CRESTOR) 40 MG tablet Take 1 tablet (40 mg total) by mouth daily.  . timolol (TIMOPTIC-XR) 0.5 % ophthalmic gel-forming Place 1 drop into both eyes every morning.  . TRAVATAN Z 0.004 % SOLN ophthalmic solution Place 1 drop into both eyes every evening.   Social History   Tobacco Use  . Smoking status: Former Smoker    Quit date: 06/22/1961    Years since quitting: 57.8  . Smokeless  tobacco: Never Used  Substance Use Topics  . Alcohol use: Yes    Comment: 4 drinks per week    Family History  Problem Relation Age of Onset  . Heart disease Father        smoker  . Heart attack Father   . Cancer Mother   . Cancer Other        ?? not sure what kind     Review of Systems  Constitutional: Negative for activity change, appetite change, chills, fatigue, fever and unexpected weight change.  HENT: Negative for congestion, ear pain, hearing loss, sinus pressure, sinus pain, sore throat and trouble swallowing.   Eyes: Negative for pain and visual disturbance.  Respiratory: Negative for cough, chest tightness, shortness of breath and wheezing.   Cardiovascular: Negative for chest pain, palpitations and leg swelling.  Gastrointestinal: Negative for abdominal distention, abdominal pain, blood in stool, constipation, diarrhea, nausea and vomiting.  Genitourinary: Negative for decreased urine volume, difficulty urinating, dysuria, penile pain and testicular pain.  Musculoskeletal: Negative for arthralgias, back pain and joint swelling.  Skin: Negative for rash.  Neurological: Negative for dizziness, weakness, numbness and headaches.  Hematological: Negative for adenopathy. Does not bruise/bleed easily.  Psychiatric/Behavioral: Negative for agitation, sleep disturbance and suicidal ideas. The patient is not nervous/anxious.     CBC:  Lab Results  Component Value Date   WBC 5.9 03/30/2018   HGB 14.1 03/30/2018   HCT 41.5 03/30/2018   MCH 31.1 08/24/2017   MCHC 34.0 03/30/2018   RDW 14.2 03/30/2018   PLT 119.0 (L) 03/30/2018   CMP: Lab Results  Component Value Date   NA 138 11/28/2018   K 3.7 11/28/2018   CL 105 11/28/2018   CO2 23 11/28/2018   ANIONGAP 10 11/28/2018   GLUCOSE 115 (H) 11/28/2018   BUN 14 11/28/2018   CREATININE 0.84 11/28/2018   GFRAA >60 11/28/2018   CALCIUM 9.5 11/28/2018   PROT 6.7 03/30/2018   BILITOT 1.3 (H) 03/30/2018   ALKPHOS 62  03/30/2018   ALT 18 03/30/2018   AST 18 03/30/2018   LIPID: Lab Results  Component Value Date   CHOL 113 11/28/2018   TRIG 194 (H) 11/28/2018   HDL 31 (L) 11/28/2018   LDLCALC 43 11/28/2018    Objective:  BP (!) 90/50 (BP Location: Left Arm, Patient Position: Sitting, Cuff Size: Large)   Pulse 75   Temp 97.7 F (36.5 C) (Temporal)   Ht 5' 8.5" (1.74 m)   Wt 194 lb 4.8 oz (88.1 kg)   SpO2 97%   BMI 29.11 kg/m   Weight: 194 lb 4.8 oz (88.1 kg)   BP Readings from Last 3 Encounters:  04/12/19 (!) 90/50  06/28/18 118/78  04/21/18 128/62   Wt Readings  from Last 3 Encounters:  04/12/19 194 lb 4.8 oz (88.1 kg)  06/28/18 201 lb 12.8 oz (91.5 kg)  04/21/18 201 lb 6.4 oz (91.4 kg)    Physical Exam Constitutional:      General: He is not in acute distress.    Appearance: He is well-developed.  HENT:     Head: Normocephalic and atraumatic.     Right Ear: External ear normal.     Left Ear: External ear normal.     Nose: Nose normal.     Mouth/Throat:     Pharynx: No oropharyngeal exudate.  Eyes:     Conjunctiva/sclera: Conjunctivae normal.     Pupils: Pupils are equal, round, and reactive to light.  Neck:     Musculoskeletal: Neck supple.     Thyroid: No thyromegaly.  Cardiovascular:     Rate and Rhythm: Normal rate and regular rhythm.     Heart sounds: Normal heart sounds. No murmur. No friction rub. No gallop.   Pulmonary:     Effort: Pulmonary effort is normal. No respiratory distress.     Breath sounds: Normal breath sounds. No stridor. No wheezing or rales.  Abdominal:     General: Bowel sounds are normal.     Palpations: Abdomen is soft.  Musculoskeletal: Normal range of motion.  Skin:    General: Skin is warm and dry.  Neurological:     Mental Status: He is alert and oriented to person, place, and time.  Psychiatric:        Behavior: Behavior normal.        Thought Content: Thought content normal.        Judgment: Judgment normal.    Depression screen  PHQ 2/9 04/12/2019  Decreased Interest 0  Down, Depressed, Hopeless 0  PHQ - 2 Score 0     Assessment/Plan: Health Maintenance Due  Topic Date Due  . TETANUS/TDAP  07/12/1954  . PNA vac Low Risk Adult (1 of 2 - PCV13) 07/12/2000   Health Maintenance reviewed.  1. Preventative health care Up to date with preventative health care.   2. Essential hypertension Low today; advised checking at home and reporting back to [place]. May be able to decrease medication. No sx of hypotension at this point.  - CBC with Differential/Platelet; Future - Comprehensive metabolic panel; Future  3. HYPERCHOLESTEROLEMIA Doing well on the crestor. Continue current medication.  4. Elevated fasting glucose - Hemoglobin A1c; Future  Return for report back blood pressures in 2 weeks time; follow up visit in 6 months time.  Micheline Rough, MD

## 2019-04-12 NOTE — Patient Instructions (Addendum)
Check blood pressures daily and report back in 2 weeks.   Please have pharmacy send [place] confirmation of shingles vaccine.

## 2019-04-13 ENCOUNTER — Telehealth: Payer: Self-pay | Admitting: *Deleted

## 2019-04-13 NOTE — Telephone Encounter (Signed)
Copied from [place] [id number]. Topic: General - Other >> Apr 13, 2019  3:54 PM Willie Lynch A wrote: Reason for CRM: Patient called to inform Dr Idelle Jo that his BP was 119/76 today

## 2019-04-14 ENCOUNTER — Telehealth: Payer: Self-pay | Admitting: *Deleted

## 2019-04-14 NOTE — Telephone Encounter (Signed)
Copied from [place] [id number]. Topic: General - Other >> Apr 14, 2019  3:08 PM Keene Breath wrote: Reason for CRM: Patient called to inform the doctor of his BP reading today, which was 137/77.

## 2019-04-14 NOTE — Telephone Encounter (Signed)
Addressed in other message.

## 2019-04-14 NOTE — Telephone Encounter (Signed)
Good. That is looking better. Thanks for the update. I would advise continuing to check on daily or almost daily basis and then let us know if regularly seeing numbers less than [id number] systolic.

## 2019-04-14 NOTE — Telephone Encounter (Signed)
I left a detailed message at the pts home number with the information below.  

## 2019-04-17 ENCOUNTER — Telehealth: Payer: Self-pay | Admitting: Family Medicine

## 2019-04-17 NOTE — Telephone Encounter (Signed)
Patient calling to leave BP readings.    Saturday 155/78  Sunday 150/77  Monday 135/78

## 2019-04-19 NOTE — Telephone Encounter (Signed)
Now looks like pressures are running on higher end at home! He was quite low in the office. If he is comfortable with this, my suggestion would be to continue recording about daily; and then schedule follow up visit in office in next 1-2 months and bring cuff with him so we can check against our own. I suspect his reads a little higher at home than ours in office.

## 2019-04-19 NOTE — Telephone Encounter (Signed)
Left a message for the pt to return my call.  CRM also created.  

## 2019-04-20 ENCOUNTER — Telehealth: Payer: Self-pay

## 2019-04-20 NOTE — Telephone Encounter (Signed)
[place] better. He doesn't have to call and report unless he has concerns about pressures. Just write them down and bring log to next appointment in November with me.

## 2019-04-20 NOTE — Telephone Encounter (Signed)
I called the pt and informed him of the message below. 

## 2019-04-20 NOTE — Telephone Encounter (Signed)
Patient calling to leave readings for today 142/80- morning  123/70- afternoon

## 2019-04-20 NOTE — Telephone Encounter (Signed)
Copied from [place] [id number]. Topic: General - Other >> Apr 20, 2019 11:13 AM Celene Kras A wrote: Reason for CRM: Pt called stating is bp 137/78 on 04/19/2019 and today 137/79.

## 2019-04-20 NOTE — Telephone Encounter (Signed)
Patient called back and was informed of the message below.  Appt scheduled for 11/30.

## 2019-04-21 ENCOUNTER — Other Ambulatory Visit: Payer: Self-pay | Admitting: Pulmonary Disease

## 2019-04-21 NOTE — Telephone Encounter (Signed)
Ok. bp has been stable at home. Please see my message down below.  He does not have to call and report daily.  Just record these and bring cuff and recorded blood pressure readings to his next visit next month.  Let me know if any concerns.

## 2019-04-21 NOTE — Telephone Encounter (Signed)
I called the pt and informed him of the message below. 

## 2019-04-25 ENCOUNTER — Other Ambulatory Visit: Payer: Self-pay | Admitting: Family Medicine

## 2019-05-22 ENCOUNTER — Ambulatory Visit: Payer: Medicare Other | Admitting: Family Medicine

## 2019-05-22 NOTE — Progress Notes (Signed)
NO Show

## 2019-06-09 ENCOUNTER — Other Ambulatory Visit: Payer: Self-pay

## 2019-06-12 ENCOUNTER — Encounter: Payer: Self-pay | Admitting: Family Medicine

## 2019-06-12 ENCOUNTER — Other Ambulatory Visit: Payer: Self-pay

## 2019-06-12 ENCOUNTER — Ambulatory Visit (INDEPENDENT_AMBULATORY_CARE_PROVIDER_SITE_OTHER): Payer: Medicare Other | Admitting: Family Medicine

## 2019-06-12 DIAGNOSIS — I10 Essential (primary) hypertension: Secondary | ICD-10-CM

## 2019-06-12 NOTE — Progress Notes (Signed)
Virtual Visit via Telephone Note  I connected with Willie Lynch  on 06/12/19 at 12:00 PM EST by telephone and verified that I am speaking with the correct person using two identifiers.   I discussed the limitations, risks, security and privacy concerns of performing an evaluation and management service by telephone and the availability of in person appointments. I also discussed with the patient that there may be a patient responsible charge related to this service. The patient expressed understanding and agreed to proceed.  Location patient: home Location provider: work office Participants present for the call: patient, provider Patient did not have a visit in the prior 7 days to address this/these issue(s).   History of Present Illness: Has been monitoring blood pressure due to low pressure in office. Readings at home were a little higher and advised to continue current medication: HCTZ, bystolic 5mg .  BP this morning 118/66. No issues with feeling light headed or dizzy.    Observations/Objective: Patient sounds cheerful and well on the phone. I do not appreciate any SOB. Speech and thought processing are grossly intact. Patient reported vitals:  Assessment and Plan:  1. Essential hypertension Well controlled. Pressures all are higher than in office and he is not experiencing any hypotensive symptoms. He will occasionally monitor at home. We will set up follow up lab visit for him and he already has next visit set up in April.  Follow Up Instructions:  Labs in feb; visit in april  99441 5-10 99442 11-20 9443 21-30 I did not refer this patient for an OV in the next 24 hours for this/these issue(s).  I discussed the assessment and treatment plan with the patient. The patient was provided an opportunity to ask questions and all were answered. The patient agreed with the plan and demonstrated an understanding of the instructions.   The patient was advised to call back or seek  an in-person evaluation if the symptoms worsen or if the condition fails to improve as anticipated.  I provided 10 minutes of non-face-to-face time during this encounter.   Micheline Rough, MD

## 2019-06-13 ENCOUNTER — Telehealth: Payer: Self-pay | Admitting: *Deleted

## 2019-06-13 NOTE — Telephone Encounter (Signed)
-----   Message from [name], MD sent at 06/12/2019 12:21 PM EST ----- Labs are ordered in system; can you set up lab visit for him maybe in February?

## 2019-06-13 NOTE — Telephone Encounter (Signed)
Left a detailed message at the pts home number to call for a lab appt as below.

## 2019-06-30 ENCOUNTER — Other Ambulatory Visit (HOSPITAL_COMMUNITY): Payer: Self-pay

## 2019-06-30 MED ORDER — LOSARTAN POTASSIUM 50 MG PO TABS: ORAL_TABLET | ORAL | 3 refills | Status: DC

## 2019-07-14 ENCOUNTER — Ambulatory Visit: Payer: Medicare Other | Attending: Internal Medicine

## 2019-07-14 DIAGNOSIS — Z23 Encounter for immunization: Secondary | ICD-10-CM | POA: Insufficient documentation

## 2019-07-14 NOTE — Progress Notes (Signed)
   Covid-19 Vaccination Clinic  Name:  Willie Lynch    MRN: [id number] DOB: Jun 16, 1934  07/14/2019  [name] was observed post Covid-19 immunization for 15 minutes without incidence. He was provided with Vaccine Information Sheet and instruction to access the V-Safe system.   [name] was instructed to call 911 with any severe reactions post vaccine: Marland Kitchen Difficulty breathing  . Swelling of your face and throat  . A fast heartbeat  . A bad rash all over your body  . Dizziness and weakness    Immunizations Administered    Name Date Dose VIS Date Route   Pfizer COVID-19 Vaccine 07/14/2019  8:22 AM 0.3 mL 06/02/2019 Intramuscular   Manufacturer: [employer]   Lot: [id number]   [place]: [id number]

## 2019-07-24 ENCOUNTER — Other Ambulatory Visit (HOSPITAL_COMMUNITY): Payer: Self-pay

## 2019-07-24 MED ORDER — CLOPIDOGREL BISULFATE 75 MG PO TABS: ORAL_TABLET | ORAL | 3 refills | Status: DC

## 2019-07-26 ENCOUNTER — Other Ambulatory Visit: Payer: Self-pay | Admitting: *Deleted

## 2019-07-26 MED ORDER — PRAMIPEXOLE DIHYDROCHLORIDE 0.5 MG PO TABS: ORAL_TABLET | ORAL | 1 refills | Status: DC

## 2019-07-26 NOTE — Telephone Encounter (Signed)
Rx done. 

## 2019-07-31 ENCOUNTER — Other Ambulatory Visit: Payer: Self-pay

## 2019-07-31 ENCOUNTER — Other Ambulatory Visit (INDEPENDENT_AMBULATORY_CARE_PROVIDER_SITE_OTHER): Payer: Medicare Other

## 2019-07-31 DIAGNOSIS — R7301 Impaired fasting glucose: Secondary | ICD-10-CM

## 2019-07-31 DIAGNOSIS — I10 Essential (primary) hypertension: Secondary | ICD-10-CM

## 2019-07-31 LAB — CBC WITH DIFFERENTIAL/PLATELET
Basophils Absolute: 0 10*3/uL (ref 0.0–0.1)
Basophils Relative: 0.7 % (ref 0.0–3.0)
Eosinophils Absolute: 0.2 10*3/uL (ref 0.0–0.7)
Eosinophils Relative: 3.7 % (ref 0.0–5.0)
HCT: 42.9 % (ref 39.0–52.0)
Hemoglobin: 14.8 g/dL (ref 13.0–17.0)
Lymphocytes Relative: 31.1 % (ref 12.0–46.0)
Lymphs Abs: 1.7 10*3/uL (ref 0.7–4.0)
MCHC: 34.4 g/dL (ref 30.0–36.0)
MCV: 89.9 fl (ref 78.0–100.0)
Monocytes Absolute: 0.7 10*3/uL (ref 0.1–1.0)
Monocytes Relative: 13.2 % — ABNORMAL HIGH (ref 3.0–12.0)
Neutro Abs: 2.8 10*3/uL (ref 1.4–7.7)
Neutrophils Relative %: 51.3 % (ref 43.0–77.0)
Platelets: 105 10*3/uL — ABNORMAL LOW (ref 150.0–400.0)
RBC: 4.77 Mil/uL (ref 4.22–5.81)
RDW: 14.1 % (ref 11.5–15.5)
WBC: 5.4 10*3/uL (ref 4.0–10.5)

## 2019-07-31 LAB — COMPREHENSIVE METABOLIC PANEL
ALT: 13 U/L (ref 0–53)
AST: 14 U/L (ref 0–37)
Albumin: 4.3 g/dL (ref 3.5–5.2)
Alkaline Phosphatase: 75 U/L (ref 39–117)
BUN: 17 mg/dL (ref 6–23)
CO2: 25 mEq/L (ref 19–32)
Calcium: 9.5 mg/dL (ref 8.4–10.5)
Chloride: 102 mEq/L (ref 96–112)
Creatinine, Ser: 0.81 mg/dL (ref 0.40–1.50)
GFR: 90.77 mL/min (ref >60.00)
Glucose, Bld: 123 mg/dL — ABNORMAL HIGH (ref 70–99)
Potassium: 3.9 mEq/L (ref 3.5–5.1)
Sodium: 135 mEq/L (ref 135–145)
Total Bilirubin: 1.4 mg/dL — ABNORMAL HIGH (ref 0.2–1.2)
Total Protein: 6.6 g/dL (ref 6.0–8.3)

## 2019-07-31 LAB — HEMOGLOBIN A1C: Hgb A1c MFr Bld: 5.5 % (ref 4.6–6.5)

## 2019-08-03 ENCOUNTER — Telehealth: Payer: Self-pay | Admitting: Family Medicine

## 2019-08-03 NOTE — Telephone Encounter (Signed)
I left a detailed message at the pts son cell number asking if he would call back and leave a detailed message in order for this to be sent to the pts PCP.

## 2019-08-03 NOTE — Telephone Encounter (Signed)
Pts son is calling wanted to verify pts medication with what they have and a referral to neurologist in [place] and want to discuss why he thinks that the needs to see a neurologist and will only elaborate with the doctor

## 2019-08-03 NOTE — Telephone Encounter (Signed)
Son [name] wants a call back to go over prescriptions.  He has a couple questions. He is on the [facility].

## 2019-08-03 NOTE — Telephone Encounter (Signed)
Ok to confirm medication list.   Please let him know I'm not in today. Would he like to discuss with patient/me through virtual visit or perhaps in office visit? If he can give idea of concern it will help so I know if we need to get him added in more urgently to schedule. Not opposed to referral but we need to know why referring and urgency timeline.

## 2019-08-03 NOTE — Telephone Encounter (Signed)
Spoke with the pts son and informed him of the message below.  He stated he and other family members feel the patient is having problems with his memory and feels he should see a specialist.  For example, he stated his father got lost on the way to [facility], which is his favorite store and he goes there all of the time.  Stated he was to join his friends for lunch, got lost, was embarrassed and went back home.  Stated he asks the same questions repeatedly, like this weekend when he visited, he asked over and over if he was going to the doctor with him and they are concerned. Message sent to PCP.

## 2019-08-04 ENCOUNTER — Ambulatory Visit: Payer: Medicare Other | Attending: Internal Medicine

## 2019-08-04 DIAGNOSIS — Z23 Encounter for immunization: Secondary | ICD-10-CM | POA: Insufficient documentation

## 2019-08-04 NOTE — Telephone Encounter (Signed)
Thanks for detailed message.   *if they just want to see neurology, I am ok with referral.   *I am also happy to meet with them together and do some memory testing, discussion, evaluation to get a better idea of what is going on. Typically there is bloodwork and perhaps imaging that is done when we start to have memory concerns so if they would like to meet with me to do this I would be happy to do so. If they prefer just to see neurology that is ok too and you can put in referral.

## 2019-08-04 NOTE — Telephone Encounter (Signed)
[name] called back and was informed of the message below.  He stated he would prefer a visit here first and requested an appt on Friday or Monday.  Appt scheduled for 3/5 to arrive at 7:45am and he will inform the pt of this also.

## 2019-08-04 NOTE — Telephone Encounter (Signed)
I left a message for the pts son [name] to return my call.

## 2019-08-04 NOTE — Progress Notes (Signed)
   Covid-19 Vaccination Clinic  Name:  Willie Lynch    MRN: [id number] DOB: Jun 04, 1934  08/04/2019  [name] was observed post Covid-19 immunization for 15 minutes without incidence. He was provided with Vaccine Information Sheet and instruction to access the V-Safe system.   [name] was instructed to call 911 with any severe reactions post vaccine: Marland Kitchen Difficulty breathing  . Swelling of your face and throat  . A fast heartbeat  . A bad rash all over your body  . Dizziness and weakness    Immunizations Administered    Name Date Dose VIS Date Route   Pfizer COVID-19 Vaccine 08/04/2019  8:16 AM 0.3 mL 06/02/2019 Intramuscular   Manufacturer: [employer]   Lot: [id number]   [place]: [id number]

## 2019-08-23 ENCOUNTER — Other Ambulatory Visit (HOSPITAL_COMMUNITY): Payer: Self-pay

## 2019-08-23 MED ORDER — HYDROCHLOROTHIAZIDE 12.5 MG PO CAPS: ORAL_CAPSULE | ORAL | 5 refills | Status: DC

## 2019-08-24 ENCOUNTER — Other Ambulatory Visit: Payer: Self-pay

## 2019-08-24 ENCOUNTER — Telehealth: Payer: Self-pay | Admitting: Family Medicine

## 2019-08-24 NOTE — Telephone Encounter (Signed)
Pts son is calling in wanting to get clarification of what will be occurring in the visit on 08/25/2019.  Son would like to have a call back [phone] to give clarification.

## 2019-08-24 NOTE — Telephone Encounter (Signed)
Sorry just seeing this as I was not in today.   Son had requested discussion of memory. So we were going to do cognitive testing, talk more about concerns/treatment options, and determine if any additional testing/referrals might be needed.

## 2019-08-24 NOTE — Progress Notes (Signed)
Willie Lynch DOB: 05/13/1934 Encounter date: 08/25/2019  This is a 86 y.o. male who presents with Chief Complaint  Patient presents with  . Follow-up    History of present illness: Interested in trying voltaren gel.  His son heard about this and thought it might be a good option for him.  Here with son today. Has some concerns with memory.  Concerns are coming from the son.  Me is worrisome last on the way to [place] recently.  Since he goes to the store all the time, son was concerned that memory may be starting to be affected.  Patient states he was trying to go to [employer] from a different location than normal, he did get turned around and so drove back home and then was able to drive back to [employer] without difficulty.  Patient himself does not feel that he has any trouble with recalling names or significant issues with short-term memory.  Feels energy level is ok. No dizziness or light headedness.   Manages all own bills, finances.   Not taking pepcid regularly; not having sx of heartburn.    Allergies  Allergen Reactions  . Amoxicillin-Pot Clavulanate     REACTION: pt developed HIVES on augmentin  . Codeine   . Morphine    Current Meds  Medication Sig  . alfuzosin (UROXATRAL) 10 MG 24 hr tablet Take 10 mg by mouth daily.    Marland Kitchen amLODipine (NORVASC) 10 MG tablet TAKE 1 TABLET BY MOUTH EVERY DAY  . aspirin (ASPIR-LOW) 81 MG EC tablet Take 162 mg by mouth daily.   . cetirizine (ZYRTEC ALLERGY) 10 MG tablet Take 10 mg by mouth as needed.    . clopidogrel (PLAVIX) 75 MG tablet TAKE 1 TABLET(75 MG) BY MOUTH DAILY  . famotidine (PEPCID) 20 MG tablet Take 1 tablet (20 mg total) by mouth daily as needed for heartburn or indigestion.  . finasteride (PROSCAR) 5 MG tablet Take 5 mg by mouth daily.    . hydrochlorothiazide (MICROZIDE) 12.5 MG capsule TAKE 1 CAPSULE(12.5 MG) BY MOUTH DAILY  . losartan (COZAAR) 50 MG tablet TAKE 1 TABLET(50 MG) BY MOUTH TWICE DAILY  . Multiple  Vitamins-Minerals (MULTIVITAL) tablet Take 1 tablet by mouth daily.    . nebivolol (BYSTOLIC) 5 MG tablet Take 1 tablet (5 mg total) by mouth daily.  . nitroGLYCERIN (NITROSTAT) 0.4 MG SL tablet DISSOLVE 1 TABLET UNDER THE TONGUE IF NEEDED AS DIRECTED.  . Omega-3 Fatty Acids (FISH OIL) 1000 MG CAPS Take 1 capsule by mouth daily.   . pramipexole (MIRAPEX) 0.5 MG tablet TAKE 2-3 TABLETS BY MOUTH EVERY NIGHT 1 HOUR BEFORE BEDTIME  . rosuvastatin (CRESTOR) 40 MG tablet Take 1 tablet (40 mg total) by mouth daily.  . timolol (TIMOPTIC-XR) 0.5 % ophthalmic gel-forming Place 1 drop into both eyes every morning.  . TRAVATAN Z 0.004 % SOLN ophthalmic solution Place 1 drop into both eyes every evening.    Review of Systems  Constitutional: Negative for chills, fatigue and fever.  Respiratory: Negative for cough, chest tightness, shortness of breath and wheezing.   Cardiovascular: Negative for chest pain, palpitations and leg swelling.  Gastrointestinal: Negative for constipation and diarrhea.  Neurological: Negative for dizziness, tremors, speech difficulty, weakness, light-headedness and headaches.  Psychiatric/Behavioral: Negative for agitation, behavioral problems, confusion, decreased concentration and sleep disturbance.    Objective:  BP (!) 112/58 (BP Location: Left Arm, Patient Position: Sitting, Cuff Size: Large)   Pulse 67   Temp (!) 97.3 F (36.3  C) (Temporal)   Ht 5' 8.5" (1.74 m)   Wt 192 lb 11.2 oz (87.4 kg)   SpO2 97%   BMI 28.87 kg/m   Weight: 192 lb 11.2 oz (87.4 kg)   BP Readings from Last 3 Encounters:  08/25/19 (!) 112/58  04/12/19 (!) 90/50  06/28/18 118/78   Wt Readings from Last 3 Encounters:  08/25/19 192 lb 11.2 oz (87.4 kg)  04/12/19 194 lb 4.8 oz (88.1 kg)  06/28/18 201 lb 12.8 oz (91.5 kg)    Physical Exam Constitutional:      General: He is not in acute distress.    Appearance: He is well-developed.  Cardiovascular:     Rate and Rhythm: Normal rate and  regular rhythm.     Heart sounds: Normal heart sounds. No murmur. No friction rub.  Pulmonary:     Effort: Pulmonary effort is normal. No respiratory distress.     Breath sounds: Normal breath sounds. No wheezing or rales.  Musculoskeletal:     Right lower leg: No edema.     Left lower leg: No edema.  Neurological:     Mental Status: He is alert and oriented to person, place, and time.     Cranial Nerves: Cranial nerves are intact.     Motor: Motor function is intact. No tremor or pronator drift.     Coordination: Coordination is intact. Romberg sign negative.     Gait: Gait is intact.     Comments: Difficulty with tandem walk; but able to balance on single feet.   Psychiatric:        Behavior: Behavior normal.     Assessment/Plan All medications were reviewed with patient son today.  Patient son was concerned that there might be discrepancy between our med list of those medications he had at home.  All concerns were discussed and clarified.   1. Memory deficit On [place] cognitive assessment, he does have mild cognitive impairment.  We discussed first obtaining blood work today to make sure no thyroid abnormality, but also discussed pros and cons of trying medication for memory impairment.  We discussed that there are no study proven supplements to help prevent or slow memory loss.  We discussed importance of staying "brain active", which the patient does.  We discussed importance of daily exercise.  We discussed limitations of Aricept, but that if desired we could start this with follow-up.  Could also consider neurology evaluation if desired. - TSH; Future - Vitamin B12; Future - Vitamin B12 - TSH  2. Essential hypertension Blood pressure and patient had run out of Bystolic and thus stopped it.  I suspect this was held by cardiology previously due to concerns that might be contributing to shortness of breath, but then restarted when patient noted no difference with holding  medication.  I encouraged him to restart his previous dose.  We are going to stop his hydrochlorothiazide, since blood pressures are on the lower end of normal today.  I have asked him to monitor pressures at home. - nebivolol (BYSTOLIC) 5 MG tablet; Take 0.5 tablets (2.5 mg total) by mouth daily.  Dispense: 45 tablet; Refill: 3  3. Atherosclerosis of native coronary artery of native heart without angina pectoris He does follow regularly with cardiology.  No angina.  He has never used Nitrostat, but prescription is out of date so we will refill today. - nitroGLYCERIN (NITROSTAT) 0.4 MG SL tablet; DISSOLVE 1 TABLET UNDER THE TONGUE IF NEEDED AS DIRECTED.  Dispense: 25 tablet;  Refill: 3  4. Arthritis Trial for over-the-counter Voltaren.  Prescription was sent to see if insurance would cover this medication. - diclofenac Sodium (VOLTAREN) 1 % GEL; Apply 2 g topically 4 (four) times daily.  Dispense: 100 g; Refill: 2   Return for pending labs.    Micheline Rough, MD

## 2019-08-25 ENCOUNTER — Encounter: Payer: Self-pay | Admitting: Family Medicine

## 2019-08-25 ENCOUNTER — Ambulatory Visit (INDEPENDENT_AMBULATORY_CARE_PROVIDER_SITE_OTHER): Payer: Medicare Other | Admitting: Family Medicine

## 2019-08-25 VITALS — BP 112/58 | HR 67 | Temp 97.3°F | Ht 68.5 in | Wt 192.7 lb

## 2019-08-25 DIAGNOSIS — I10 Essential (primary) hypertension: Secondary | ICD-10-CM | POA: Diagnosis not present

## 2019-08-25 DIAGNOSIS — M1990 Unspecified osteoarthritis, unspecified site: Secondary | ICD-10-CM

## 2019-08-25 DIAGNOSIS — R413 Other amnesia: Secondary | ICD-10-CM

## 2019-08-25 DIAGNOSIS — I2510 Atherosclerotic heart disease of native coronary artery without angina pectoris: Secondary | ICD-10-CM

## 2019-08-25 LAB — VITAMIN B12: Vitamin B-12: 279 pg/mL (ref 211–911)

## 2019-08-25 LAB — TSH: TSH: 3.18 u[IU]/mL (ref 0.35–4.50)

## 2019-08-25 MED ORDER — NEBIVOLOL HCL 5 MG PO TABS: 2.5000 mg | ORAL_TABLET | Freq: Every day | ORAL | 3 refills | Status: DC

## 2019-08-25 MED ORDER — NITROGLYCERIN 0.4 MG SL SUBL: SUBLINGUAL_TABLET | SUBLINGUAL | 3 refills | Status: DC

## 2019-08-25 MED ORDER — DICLOFENAC SODIUM 1 % EX GEL: 2.0000 g | Freq: Four times a day (QID) | CUTANEOUS | 2 refills | Status: DC

## 2019-08-25 NOTE — Telephone Encounter (Signed)
Patient is here in the office for a visit and son is present.

## 2019-08-25 NOTE — Patient Instructions (Signed)
Check blood pressures and heart rate daily.

## 2019-08-28 ENCOUNTER — Telehealth: Payer: Self-pay | Admitting: Family Medicine

## 2019-08-28 ENCOUNTER — Other Ambulatory Visit: Payer: Self-pay | Admitting: Family Medicine

## 2019-08-28 MED ORDER — DONEPEZIL HCL 5 MG PO TABS: 5.0000 mg | ORAL_TABLET | Freq: Every day | ORAL | 2 refills | Status: DC

## 2019-08-28 MED ORDER — DONEPEZIL HCL 5 MG PO TABS: 5.0000 mg | ORAL_TABLET | Freq: Every day | ORAL | 1 refills | Status: DC

## 2019-08-28 NOTE — Telephone Encounter (Signed)
Wanted to make sure we were on the same page regarding memory issues with dad.  States that the memory issues are more frequent than was discussed at the visit.  He repeats questions quite frequently, and has driven the wrong way on more than one occasion.  His friends as well as his other son have noted issues with short-term memory.  I discussed with [name] that we are going to start Aricept and increase as to therapeutic dose.  We discussed that we can always involve neurology if needed.  Given his normal blood work and normal neurologic exam, but mild cognitive decline, I do not think further evaluation is needed at this point.  He ready has follow-up appointment set for April.

## 2019-08-28 NOTE — Addendum Note (Signed)
Addended by: Agnes Lawrence on: 08/28/2019 02:28 PM   Modules accepted: Orders

## 2019-08-28 NOTE — Telephone Encounter (Addendum)
Pt' son, mike Heagerty (ok on [place]) He would like Koberlein or her CMA to discuss his father's lab results before we call his father and some additional feedback based on his father's last appt. He would prefer if Koberlein would call if possible  [name] can be reached at [phone]

## 2019-09-04 ENCOUNTER — Other Ambulatory Visit (HOSPITAL_COMMUNITY): Payer: Self-pay | Admitting: *Deleted

## 2019-09-04 MED ORDER — AMLODIPINE BESYLATE 10 MG PO TABS: ORAL_TABLET | ORAL | 0 refills | Status: DC

## 2019-10-11 ENCOUNTER — Telehealth: Payer: Self-pay | Admitting: *Deleted

## 2019-10-11 ENCOUNTER — Encounter: Payer: Self-pay | Admitting: Family Medicine

## 2019-10-11 ENCOUNTER — Other Ambulatory Visit: Payer: Self-pay

## 2019-10-11 ENCOUNTER — Ambulatory Visit (INDEPENDENT_AMBULATORY_CARE_PROVIDER_SITE_OTHER): Payer: Medicare Other | Admitting: Family Medicine

## 2019-10-11 DIAGNOSIS — R413 Other amnesia: Secondary | ICD-10-CM

## 2019-10-11 DIAGNOSIS — I10 Essential (primary) hypertension: Secondary | ICD-10-CM | POA: Diagnosis not present

## 2019-10-11 MED ORDER — DONEPEZIL HCL 5 MG PO TABS: 5.0000 mg | ORAL_TABLET | Freq: Every day | ORAL | 2 refills | Status: DC

## 2019-10-11 NOTE — Telephone Encounter (Signed)
-----   Message from [name], MD sent at 10/11/2019  8:35 AM EDT ----- Could you please call son [name]) and just let him know dad had phone visit this morning. Aurick did not think that [name] had requested to be on call. I just wanted to give [name] opportunity to be present (we can even call him virtually if he would like) for follow up visit in a few months. Kaceon did not recall getting the aricept rx that was sent in last month, so I have resent this today to start for memory. We also discussed continuing to monitor BP at home. Additionally I asked Jacieon to get Tdap at pharmacy and ask about his previous pneumonia vaccine through pharmacy. He wrote down these suggestions today. Let me know if any questions, but otherwise please set up [facility] for return visit in 3 mo time.

## 2019-10-11 NOTE — Telephone Encounter (Signed)
Spoke with the pts son Merry Proud and informed him of the message below.  Merry Proud asked that I call [name], as he attended the last visit to inform him of this also.  I called [name] [phone]) and informed him of the message below.  Follow up appt scheduled for a Mychart video visit and [name] stated he will be at the visits home for this visit.  Also sent email to [facility] for the pts Mychart as well.   Patient was informed of the appt on 7/23.

## 2019-10-11 NOTE — Progress Notes (Addendum)
Virtual Visit via Telephone Note  I connected with Willie Lynch on 10/11/19 at  8:00 AM EDT by telephone and verified that I am speaking with the correct person using two identifiers.   I discussed the limitations, risks, security and privacy concerns of performing an evaluation and management service by telephone and the availability of in person appointments. I also discussed with the patient that there may be a patient responsible charge related to this service. The patient expressed understanding and agreed to proceed.  Location patient: home Location provider: work office Participants present for the call: patient, provider Patient did not have a visit in the prior 7 days to address this/these issue(s).   History of Present Illness: At previous visit we discussed memory concerns and also adjusted bp medication due to lower running pressures. Aricept 5mg  daily started.   Patient states no concerns today.   HTN: hasn't been checking too regularly at home. Had been running about the same. Today's reading was 143/80 and this was higher for him than it had been running.  He does not remember starting aricept; quick check of home meds on phone did not reveal this. He asked that med be re-sent to the pharmacy. He states that he does not have concerns with his memory; feels that this is more of son's concern, but he is ok with starting medication if suggested.    No difficulty with breathing. No chest pressure, chest pain.     Observations/Objective: Patient sounds cheerful and well on the phone. I do not appreciate any SOB. Speech and thought processing are grossly intact. Patient reported vitals: bp: 143/80  Assessment and Plan: 1. Memory deficit Patient wrote down name of aricept to further check home meds. He asked that rx be resent to pharmacy for him. I think reasonable to set up follow up in 3 months time and I will also reach out to son to see if he would like to be present  for that visit.   2. Essential hypertension He is taking all home meds regularly; thoroughly checked with son at last visit. I have asked [place] to check more regularly at home with goal of pressure being less than [id number] systolic (which it has been at office last few times).    Follow Up Instructions: Return in about 3 months (around 01/10/2020) for Chronic condition visit.   99441 5-10 99442 11-20 9443 21-30 I did not refer this patient for an OV in the next 24 hours for this/these issue(s).  I discussed the assessment and treatment plan with the patient. The patient was provided an opportunity to ask questions and all were answered. The patient agreed with the plan and demonstrated an understanding of the instructions.   The patient was advised to call back or seek an in-person evaluation if the symptoms worsen or if the condition fails to improve as anticipated.  I provided 12 minutes of non-face-to-face time during this encounter.   Micheline Rough, MD

## 2019-10-22 ENCOUNTER — Other Ambulatory Visit: Payer: Self-pay | Admitting: Family Medicine

## 2019-11-22 ENCOUNTER — Other Ambulatory Visit (HOSPITAL_COMMUNITY): Payer: Self-pay

## 2019-11-22 MED ORDER — AMLODIPINE BESYLATE 10 MG PO TABS: ORAL_TABLET | ORAL | 0 refills | Status: DC

## 2019-12-21 ENCOUNTER — Other Ambulatory Visit (HOSPITAL_COMMUNITY): Payer: Self-pay

## 2019-12-21 MED ORDER — ROSUVASTATIN CALCIUM 40 MG PO TABS: 40.0000 mg | ORAL_TABLET | Freq: Every day | ORAL | 3 refills | Status: DC

## 2020-01-08 ENCOUNTER — Telehealth: Payer: Self-pay | Admitting: Family Medicine

## 2020-01-08 NOTE — Telephone Encounter (Addendum)
Pt's son [name] would like to speak to the pt's PCP before his appt on Friday 7/23 regarding his progress with his Dad. [name] stated he feels his memory has shifted since March and would like to discuss more. [name] is also wondering if its ok for the visit to be in office?  Pt can be reached at [phone]

## 2020-01-10 NOTE — Telephone Encounter (Signed)
Ok to have in office appointment. I spoke with son about his concerns - has not shown up a couple of meetings/luncheons he was supposed to be at. Uncertain if taking medication; interested in home checks with medications. Son is contacted from his friends about forgetfulness. We discussed some options like getting pharmacist involved, home health to help with med distribution.

## 2020-01-10 NOTE — Telephone Encounter (Signed)
Info added to the appt notes.

## 2020-01-12 ENCOUNTER — Other Ambulatory Visit: Payer: Self-pay

## 2020-01-12 ENCOUNTER — Telehealth: Payer: Self-pay | Admitting: Family Medicine

## 2020-01-12 ENCOUNTER — Encounter: Payer: Self-pay | Admitting: Family Medicine

## 2020-01-12 ENCOUNTER — Ambulatory Visit: Payer: Medicare Other | Admitting: Family Medicine

## 2020-01-12 VITALS — BP 116/62 | HR 70 | Temp 97.5°F | Ht 68.5 in | Wt 192.4 lb

## 2020-01-12 DIAGNOSIS — E7800 Pure hypercholesterolemia, unspecified: Secondary | ICD-10-CM | POA: Diagnosis not present

## 2020-01-12 DIAGNOSIS — I10 Essential (primary) hypertension: Secondary | ICD-10-CM

## 2020-01-12 DIAGNOSIS — Z23 Encounter for immunization: Secondary | ICD-10-CM | POA: Diagnosis not present

## 2020-01-12 DIAGNOSIS — D489 Neoplasm of uncertain behavior, unspecified: Secondary | ICD-10-CM

## 2020-01-12 DIAGNOSIS — N401 Enlarged prostate with lower urinary tract symptoms: Secondary | ICD-10-CM

## 2020-01-12 DIAGNOSIS — G2581 Restless legs syndrome: Secondary | ICD-10-CM | POA: Diagnosis not present

## 2020-01-12 DIAGNOSIS — C44329 Squamous cell carcinoma of skin of other parts of face: Secondary | ICD-10-CM | POA: Diagnosis not present

## 2020-01-12 DIAGNOSIS — N138 Other obstructive and reflux uropathy: Secondary | ICD-10-CM

## 2020-01-12 DIAGNOSIS — R413 Other amnesia: Secondary | ICD-10-CM

## 2020-01-12 MED ORDER — AMLODIPINE BESYLATE 10 MG PO TABS: ORAL_TABLET | ORAL | 1 refills | Status: DC

## 2020-01-12 MED ORDER — PRAMIPEXOLE DIHYDROCHLORIDE 0.5 MG PO TABS: ORAL_TABLET | ORAL | 1 refills | Status: DC

## 2020-01-12 NOTE — Patient Instructions (Signed)
Ask at pharmacy if you have had the shingrix vaccine.   Also get the Tdap vaccination.

## 2020-01-12 NOTE — Chronic Care Management (AMB) (Signed)
  Chronic Care Management   Note  01/12/2020 Name: Willie Lynch MRN: [id number] DOB: 04/10/1934  Willie Lynch is a 86 y.o. year old male who is a primary care patient of Koberlein, Steele Berg, MD. I reached out to [facility] by phone today in response to a referral sent by Willie Lynch's PCP, [name], MD.   Willie Lynch was given information about Chronic Care Management services today including:  1. CCM service includes personalized support from designated clinical staff supervised by his physician, including individualized plan of care and coordination with other care providers 2. 24/7 contact phone numbers for assistance for urgent and routine care needs. 3. Service will only be billed when office clinical staff spend 20 minutes or more in a month to coordinate care. 4. Only one practitioner may furnish and bill the service in a calendar month. 5. The patient may stop CCM services at any time (effective at the end of the month) by phone call to the office staff.   Patient agreed to services and verbal consent obtained.   Follow up plan:   Carley Perdue [employer] Scheduler

## 2020-01-12 NOTE — Progress Notes (Addendum)
Willie Lynch DOB: Jan 07, 1934 Encounter date: 01/12/2020  This is a 86 y.o. male who presents with Chief Complaint  Patient presents with  . Follow-up    History of present illness: HTN: not checking regularly at home. Not sure what numbers are at home. Not light headed, dizzy, no headaches.   Memory deficit - did not start aricept after initially prescribed, but was started at April appointment. No problems tolerating the medication. Patient himself states that memory not what it used to be, but no concerns.   Feels that mood is ok; no significant depression or anxiety.    Allergies  Allergen Reactions  . Amoxicillin-Pot Clavulanate     REACTION: pt developed HIVES on augmentin  . Codeine   . Morphine    Current Meds  Medication Sig  . alfuzosin (UROXATRAL) 10 MG 24 hr tablet Take 10 mg by mouth daily.    Marland Kitchen amLODipine (NORVASC) 10 MG tablet TAKE 1 TABLET BY MOUTH EVERY DAY  . aspirin (ASPIR-LOW) 81 MG EC tablet Take 162 mg by mouth daily.   . clopidogrel (PLAVIX) 75 MG tablet TAKE 1 TABLET(75 MG) BY MOUTH DAILY  . diclofenac Sodium (VOLTAREN) 1 % GEL Apply 2 g topically 4 (four) times daily.  Marland Kitchen donepezil (ARICEPT) 5 MG tablet Take 1 tablet (5 mg total) by mouth at bedtime.  . famotidine (PEPCID) 20 MG tablet Take 1 tablet (20 mg total) by mouth daily as needed for heartburn or indigestion.  . finasteride (PROSCAR) 5 MG tablet Take 5 mg by mouth daily.    Marland Kitchen losartan (COZAAR) 50 MG tablet TAKE 1 TABLET(50 MG) BY MOUTH TWICE DAILY  . Multiple Vitamins-Minerals (MULTIVITAL) tablet Take 1 tablet by mouth daily.    . nebivolol (BYSTOLIC) 5 MG tablet Take 0.5 tablets (2.5 mg total) by mouth daily.  . nitroGLYCERIN (NITROSTAT) 0.4 MG SL tablet DISSOLVE 1 TABLET UNDER THE TONGUE IF NEEDED AS DIRECTED.  . Omega-3 Fatty Acids (FISH OIL) 1000 MG CAPS Take 1 capsule by mouth daily.   . pramipexole (MIRAPEX) 0.5 MG tablet TAKE 2 TO 3 TABLETS BY MOUTH EVERY NIGHT 1 HOUR BEFORE BEDTIME  .  rosuvastatin (CRESTOR) 40 MG tablet Take 1 tablet (40 mg total) by mouth daily.  . timolol (TIMOPTIC-XR) 0.5 % ophthalmic gel-forming Place 1 drop into both eyes every morning.  . TRAVATAN Z 0.004 % SOLN ophthalmic solution Place 1 drop into both eyes every evening.    Review of Systems  Constitutional: Negative for chills, fatigue and fever.  Respiratory: Negative for cough, chest tightness, shortness of breath and wheezing.   Cardiovascular: Negative for chest pain, palpitations and leg swelling.  Neurological: Negative for dizziness, tremors, speech difficulty, weakness, light-headedness and headaches.  Psychiatric/Behavioral: Negative for sleep disturbance. The patient is not nervous/anxious.        Denies any depressed mood    Objective:  BP (!) 116/62 (BP Location: Left Arm, Patient Position: Sitting, Cuff Size: Normal)   Pulse 70   Temp (!) 97.5 F (36.4 C) (Oral)   Ht 5' 8.5" (1.74 m)   Wt 192 lb 6.4 oz (87.3 kg)   BMI 28.83 kg/m   Weight: 192 lb 6.4 oz (87.3 kg)   BP Readings from Last 3 Encounters:  01/12/20 (!) 116/62  08/25/19 (!) 112/58  04/12/19 (!) 90/50   Wt Readings from Last 3 Encounters:  01/12/20 192 lb 6.4 oz (87.3 kg)  08/25/19 192 lb 11.2 oz (87.4 kg)  04/12/19 194 lb 4.8 oz (  88.1 kg)    Physical Exam Constitutional:      General: He is not in acute distress.    Appearance: He is well-developed.  Cardiovascular:     Rate and Rhythm: Normal rate and regular rhythm.     Heart sounds: Normal heart sounds. No murmur heard.  No friction rub.  Pulmonary:     Effort: Pulmonary effort is normal. No respiratory distress.     Breath sounds: Normal breath sounds. No wheezing or rales.  Musculoskeletal:     Right lower leg: No edema.     Left lower leg: No edema.  Skin:    Comments: Scaly raised papular lesion approximately 0.4 cm left cheek.  There is slight surrounding erythema.  Patient states that it does catch sometimes on my and can be tender.  Is  noted this only in the last 2 months.  Neurological:     Mental Status: He is alert and oriented to person, place, and time.  Psychiatric:        Behavior: Behavior normal.   Shave Biopsy Procedure Note  Pre-operative Diagnosis: Suspicious lesion  Post-operative Diagnosis: same  Locations: Left cheek.  0.4 cm and fleshy scaled papular lesion with slightly erythematous base   anesthesia: Lidocaine 1% with epinephrine   Procedure Details  Patient informed of the risks, including bleeding and infection, and benefits of the  procedure and Verbal informed consent obtained. The lesion and surrounding area was given a sterile prep using chloraprep and draped in the usual sterile fashion. The skin lesion was shaved superficially using a dermablade.  Hemostasis of biopsy site was achieved using aluminum chloride. Sterile dressing applied. The specimen was sent for pathologic examination. The patient tolerated the procedure well.  Complications: none.  Plan: 1. Instructed to keep the wound dry and covered for 24 hrs and clean thereafter with soap and water.  But no chlorine hot tubs or pool exposure to surgical site.  2. Warning signs of infection were reviewed.   3. Recommended that the patient use OTC analgesics as needed for pain.  4. Will contact patient with pathology results when obtained.   Assessment/Plan  1. Essential hypertension Blood pressure stable.  Continue current medications.  Patient and son are interested in pharmacy help possibly blister packing for medications.  Son has concerns that medications are not always being taken as prescribed. - Comprehensive metabolic panel; Future - CBC with Differential/Platelet; Future - Ambulatory referral to Chronic Care Management Services - CBC with Differential/Platelet - Comprehensive metabolic panel  2. Benign prostatic hyperplasia with urinary obstruction He has followed with [facility].  No current urinary symptoms.   Continue current medications. - Ambulatory referral to Chronic Care Management Services  3. HYPERCHOLESTEROLEMIA Currently tolerating Crestor.  Recheck baseline. - Lipid panel; Future - Ambulatory referral to Chronic Care Management Services - Lipid panel  4. RLS (restless legs syndrome) He does well with the Mirapex. - Ambulatory referral to Chronic Care Management Services  5. Need for pneumococcal vaccination - Pneumococcal polysaccharide vaccine 23-valent greater than or equal to 2yo subcutaneous/IM  6. Memory deficit Patient does not have concerns, but son is noting increased concerns with memory.  Additionally, patient's friends have reached out to son with concerns for short-term memory.  We reviewed slight decline on [place] cognitive assessment.  He has been started on Aricept.  I do think that more formal memory evaluation would be helpful for patient and sons so that they can get a better scope of memory concerns.  Would consider addition of Namenda as well as neurology referral testing.  Son mentioned concerns for mood, although patient denies any anxiety or depression, so I think that further neurocognitive testing will help with this assessment as well. - Ambulatory referral to Psychiatry  7. Neoplasm of uncertain behavior - Dermatology pathology - PR SHAV SKIN LES <0.5 CM FACE,FACIAL  Return in about 3 months (around 04/13/2020) for Chronic condition visit. In addition to time spent performing shave biopsy, over 45 minutes spent with son and patient reviewing current medications, current medical conditions, concerns for memory, memory testing and evaluation, plans for follow-up memory care, and charting.     Micheline Rough, MD

## 2020-01-13 LAB — CBC WITH DIFFERENTIAL/PLATELET
Absolute Monocytes: 545 cells/uL (ref 200–950)
Basophils Absolute: 50 cells/uL (ref 0–200)
Basophils Relative: 1.1 %
Eosinophils Absolute: 158 cells/uL (ref 15–500)
Eosinophils Relative: 3.5 %
HCT: 42.6 % (ref 38.5–50.0)
Hemoglobin: 13.8 g/dL (ref 13.2–17.1)
Lymphs Abs: 1418 cells/uL (ref 850–3900)
MCH: 29.9 pg (ref 27.0–33.0)
MCHC: 32.4 g/dL (ref 32.0–36.0)
MCV: 92.4 fL (ref 80.0–100.0)
MPV: 11.1 fL (ref 7.5–12.5)
Monocytes Relative: 12.1 %
Neutro Abs: 2331 cells/uL (ref 1500–7800)
Neutrophils Relative %: 51.8 %
Platelets: 97 10*3/uL — ABNORMAL LOW (ref 140–400)
RBC: 4.61 10*6/uL (ref 4.20–5.80)
RDW: 13.7 % (ref 11.0–15.0)
Total Lymphocyte: 31.5 %
WBC: 4.5 10*3/uL (ref 3.8–10.8)

## 2020-01-13 LAB — COMPREHENSIVE METABOLIC PANEL
AG Ratio: 2.2 (calc) (ref 1.0–2.5)
ALT: 13 U/L (ref 9–46)
AST: 14 U/L (ref 10–35)
Albumin: 4.4 g/dL (ref 3.6–5.1)
Alkaline phosphatase (APISO): 65 U/L (ref 35–144)
BUN: 16 mg/dL (ref 7–25)
CO2: 27 mmol/L (ref 20–32)
Calcium: 9.2 mg/dL (ref 8.6–10.3)
Chloride: 104 mmol/L (ref 98–110)
Creat: 0.85 mg/dL (ref 0.70–1.11)
Globulin: 2 g/dL (calc) (ref 1.9–3.7)
Glucose, Bld: 102 mg/dL — ABNORMAL HIGH (ref 65–99)
Potassium: 3.8 mmol/L (ref 3.5–5.3)
Sodium: 137 mmol/L (ref 135–146)
Total Bilirubin: 1.4 mg/dL — ABNORMAL HIGH (ref 0.2–1.2)
Total Protein: 6.4 g/dL (ref 6.1–8.1)

## 2020-01-13 LAB — LIPID PANEL
Cholesterol: 106 mg/dL (ref <200)
HDL: 38 mg/dL — ABNORMAL LOW (ref >=40)
LDL Cholesterol (Calc): 50 mg/dL (calc)
Non-HDL Cholesterol (Calc): 68 mg/dL (calc) (ref <130)
Total CHOL/HDL Ratio: 2.8 (calc) (ref <5.0)
Triglycerides: 101 mg/dL (ref <150)

## 2020-01-15 ENCOUNTER — Telehealth: Payer: Self-pay | Admitting: *Deleted

## 2020-01-15 NOTE — Telephone Encounter (Signed)
Per pts request, I spoke with his son [name] -([phone]) and informed him of the results.  [name] -asked that Dr Ethlyn Gallery call him next week when she returns to the office to discuss her take on his treatment as he did not want to discuss this in front of the pt during the visit last week.

## 2020-01-16 ENCOUNTER — Encounter: Payer: Self-pay | Admitting: Counselor

## 2020-01-16 NOTE — Addendum Note (Signed)
Addended by: Agnes Lawrence on: 01/16/2020 10:11 AM   Modules accepted: Orders

## 2020-01-23 ENCOUNTER — Ambulatory Visit: Payer: Medicare Other

## 2020-01-23 ENCOUNTER — Other Ambulatory Visit: Payer: Self-pay

## 2020-01-23 DIAGNOSIS — M159 Polyosteoarthritis, unspecified: Secondary | ICD-10-CM

## 2020-01-23 DIAGNOSIS — K219 Gastro-esophageal reflux disease without esophagitis: Secondary | ICD-10-CM

## 2020-01-23 DIAGNOSIS — I2510 Atherosclerotic heart disease of native coronary artery without angina pectoris: Secondary | ICD-10-CM

## 2020-01-23 DIAGNOSIS — N138 Other obstructive and reflux uropathy: Secondary | ICD-10-CM

## 2020-01-23 DIAGNOSIS — R413 Other amnesia: Secondary | ICD-10-CM

## 2020-01-23 DIAGNOSIS — G2581 Restless legs syndrome: Secondary | ICD-10-CM

## 2020-01-23 DIAGNOSIS — I10 Essential (primary) hypertension: Secondary | ICD-10-CM

## 2020-01-23 DIAGNOSIS — E7800 Pure hypercholesterolemia, unspecified: Secondary | ICD-10-CM

## 2020-01-23 NOTE — Chronic Care Management (AMB) (Signed)
Chronic Care Management Pharmacy  Name: Willie Lynch  MRN: [id number] DOB: 11/02/1933  Initial Questions: 1. Have you seen any other providers since your last visit? N/A 2. Any changes in your medicines or health? No   Chief Complaint/ HPI  Willie Lynch,  86 y.o. , male presents for their Initial CCM visit with the clinical pharmacist via telephone due to COVID-19 Pandemic.  Patient stated he was doing well and is unable to review medications in detail since "son takes care of his medications".  Patient stated to discuss medications with son, [name].   Called and spoke to son, [name] (on [place]) and reviewed medications. Son stated he would like for patient to receive assistance in organizing medications. Son inquired on having access of patient's Mychart account.   Son reports patient does not take his medication everyday. Son reports patient refills medications on time and has overstock of medications. Son inquired in a way to simplify process of getting everything refilled at same time and method to see if patient is actually taking his medications.    PCP : [name], MD  Their chronic conditions include: coronary atherosclerosis, HTN, HLD, Memory deficit, GERD, BPH, RLS, arthritis   Office Visits: 01/12/2020- Micheline Rough, MD- patient presented for office visit for follow up.Patient to obtain lab work: CMP, CBC. Patient referred to CCM services to address blister packing. For memory deficit, Patient referred to psychiatry. Patient to return in 3 months.   10/11/2019- Micheline Rough, MD- patient presented for telephone visit for follow up. Aricept resent to patient's pharmacy. Patient to monitor blood pressure at home more regulary for goal of SBP: <140. Patient to return in 3 months.   08/25/2019- Micheline Rough, MD- Patient presented for office visit for follow up for concerns with memory. Patient to obtain blood work (TSH, vitamin B12) and discussed medication for  memory impairment. Patient to restart nebivolol 19m, 0.5 tablets once daily and stop hydrochlorothiazide since blood pressure is on lower end of normal. Nitroglycerin refilled. Patient to trial diclofenac 1% gel.   Consult Visit: 09/21/2018- Cardiology- DLoralie Champagne MD- Patient presented for virtual visit. Patient to continue plavix given multiple stents (DES). Patient to continue current medications. Patient to follow up in 1 year.   Medications: Outpatient Encounter Medications as of 01/23/2020  Medication Sig  . alfuzosin (UROXATRAL) 10 MG 24 hr tablet Take 10 mg by mouth daily.    .Marland KitchenamLODipine (NORVASC) 10 MG tablet TAKE 1 TABLET BY MOUTH EVERY DAY  . clopidogrel (PLAVIX) 75 MG tablet TAKE 1 TABLET(75 MG) BY MOUTH DAILY  . diclofenac Sodium (VOLTAREN) 1 % GEL Apply 2 g topically 4 (four) times daily.  .Marland Kitchendonepezil (ARICEPT) 10 MG tablet Take 1 tablet (10 mg total) by mouth at bedtime.  . famotidine (PEPCID) 20 MG tablet Take 1 tablet (20 mg total) by mouth daily as needed for heartburn or indigestion.  . finasteride (PROSCAR) 5 MG tablet Take 5 mg by mouth daily.    .Marland Kitchenlosartan (COZAAR) 50 MG tablet TAKE 1 TABLET(50 MG) BY MOUTH TWICE DAILY  . Multiple Vitamins-Minerals (MULTIVITAL) tablet Take 1 tablet by mouth daily.    . nebivolol (BYSTOLIC) 5 MG tablet Take 0.5 tablets (2.5 mg total) by mouth daily.  . nitroGLYCERIN (NITROSTAT) 0.4 MG SL tablet DISSOLVE 1 TABLET UNDER THE TONGUE IF NEEDED AS DIRECTED.  . Omega-3 Fatty Acids (FISH OIL) 1000 MG CAPS Take 1 capsule by mouth daily.   . pramipexole (MIRAPEX) 0.5 MG tablet  TAKE 2 TO 3 TABLETS BY MOUTH EVERY NIGHT 1 HOUR BEFORE BEDTIME  . rosuvastatin (CRESTOR) 40 MG tablet Take 1 tablet (40 mg total) by mouth daily.  Marland Kitchen aspirin (ASPIR-LOW) 81 MG EC tablet Take 162 mg by mouth daily.  (Patient not taking: Reported on 01/24/2020)  . timolol (TIMOPTIC-XR) 0.5 % ophthalmic gel-forming Place 1 drop into both eyes every morning.  . TRAVATAN Z 0.004 %  SOLN ophthalmic solution Place 1 drop into both eyes every evening.  . [DISCONTINUED] donepezil (ARICEPT) 5 MG tablet Take 1 tablet (5 mg total) by mouth at bedtime.   No facility-administered encounter medications on file as of 01/23/2020.     Current Diagnosis/Assessment:  Goals Addressed            This Visit's Progress   . Pharmacy Care Plan       CARE PLAN ENTRY (see longitudinal plan of care for additional care plan information)  Current Barriers:  . Chronic Disease Management support, education, and care coordination needs related to Hypertension, Hyperlipidemia, GERD, Osteoarthritis, BPH, and Memory deficit, RLS, coronary atherosclerosis   Hypertension BP Readings from Last 3 Encounters:  01/12/20 (!) 116/62  08/25/19 (!) 112/58  04/12/19 (!) 90/50   . Pharmacist Clinical Goal(s): o Over the next 90 days, patient will work with PharmD and providers to maintain BP goal <130/80 . Current regimen:   Amlodipine 67m, 1 tablet once dialy  Losartan 554m 1 tablet twice daily  nebivolol (Bystolic) 5 mg, 0.5 tablets once daily . Patient self care activities - Over the next 90 days, patient will: o Check BP , document, and provide at future appointments o Ensure daily salt intake < 2300 mg/day  Hyperlipidemia Lab Results  Component Value Date/Time   LDLCALC 50 01/12/2020 09:27 AM   . Pharmacist Clinical Goal(s): o Over the next 90 days, patient will work with PharmD and providers to maintain LDL goal < 70 . Current regimen:  . Rosuvastatin 4056m1 tablet once daily  . Omega-3 (fish oil) 1000m10m capsule once daily . Patient self care activities - Over the next 90 days, patient will: o Continue current medications as directed by provider.   Coronary atherosclerosis . Pharmacist Clinical Goal(s) o Over the next 90 days, patient will work with PharmD and providers to reduce reoccurrence of heart events . Current regimen:  . Clopidogrel 75mg23mtablet once daily    . Nitroglycerin 0.4mg, 2mablet under tongue if needed as directed  . Patient self care activities - Over the next 90 days, patient will: o Continue current medications as directed by provider.   Memory deficit . Pharmacist Clinical Goal(s) o Over the next 90 days, patient will work with PharmD and providers to reduce worsening of memory.  . Current regimen:  o Donepezil 5mg, 164mblet at bedtime  . Patient self care activities o Patient will continue current medications as directed by providers.  GERD . Pharmacist Clinical Goal(s) o Over the next 90 days, patient will work with PharmD and providers to reduce heartburn symptoms.  . Current regimen:  o Famotidine 20mg, 141mlet daily as needed for heartburn or indigestion . Patient self care activities o Patient will continue current medications as directed by provider.   Benign prostatic hyperplasia . Pharmacist Clinical Goal(s) o Over the next 90 days, patient will work with PharmD and providers to reduce symptoms of enlarged prostate.  . Current regimen:  . alfuzosin 10mg, 1 52met once daily . Finasteride 5mg, 19m  tablet once daily  . Patient self care activities o Patient will continue current medications as directed by provider   Restless legs syndrome (RLS)  . Pharmacist Clinical Goal(s) o Over the next 90 days, patient will work with PharmD and providers to reduce symptoms.  . Current regimen:  o Pramipexole 0.43m, 2 to 3 tablets every night 1 hour before bedtime . Patient self care activities o Patient will continue current medications as directed by provider  Osteoarthritis . Pharmacist Clinical Goal(s) o Over the next 90 days, patient will work with PharmD and providers to minimize pain . Current regimen:  o Diclofenac 1% gel, apply 2 g four times daily . Patient self care activities o Patient will continue current medications as directed by provider   Medication management . Pharmacist Clinical Goal(s): o Over  the next 90 days, patient will work with PharmD and providers to achieve optimal medication adherence . Current pharmacy: [facility] . Interventions o Comprehensive medication review performed. o Continue current medication management strategy o Reviewed [facility] pharmacy services for medication synchronization, packaging and delivery . Patient self care activities - Over the next 90 days, patient will: o Take medications as prescribed o Report any questions or concerns to PharmD and/or provider(s)  Initial goal documentation       SDOH Interventions     Most Recent Value  SDOH Interventions  Financial Strain Interventions Intervention Not Indicated  Transportation Interventions Intervention Not Indicated  [Patient reports he still drives]       Hypertension   Denies dizziness/ lightheadeness.   Office blood pressures are  BP Readings from Last 3 Encounters:  01/12/20 (!) 116/62  08/25/19 (!) 112/58  04/12/19 (!) 90/50   Patient has failed these meds in the past: hydrochlorothiazide (low BP)  Patient checks BP at home 1-2x per week  Patient home BP readings are ranging: N/A- does not recall blood pressure numbers   Patient is controlled on:   Amlodipine 167m 1 tablet once dialy  Losartan 5055m1 tablet twice daily  nebivolol (Bystolic) 5 mg, 0.5 tablets once daily  Plan Continue current medications     Hyperlipidemia   LDL goal < 70  Lipid Panel     Component Value Date/Time   CHOL 106 01/12/2020 0927   TRIG 101 01/12/2020 0927   HDL 38 (L) 01/12/2020 0927   LDLCALC 50 01/12/2020 0927    Hepatic Function Latest Ref Rng & Units 01/12/2020 07/31/2019 03/30/2018  Total Protein 6.1 - 8.1 g/dL 6.4 6.6 6.7  Albumin 3.5 - 5.2 g/dL - 4.3 4.3  AST 10 - 35 U/L 14 14 18   ALT 9 - 46 U/L 13 13 18   Alk Phosphatase 39 - 117 U/L - 75 62  Total Bilirubin 0.2 - 1.2 mg/dL 1.4(H) 1.4(H) 1.3(H)  Bilirubin, Direct 0.0 - 0.3 mg/dL - - -     The ASCVD Risk score (GoMikey Bussing  Jr., et al., 2013) failed to calculate for the following reasons:   The 2013 ASCVD risk score is only valid for ages 40 90 79 46Patient is currently controlled on the following medications:  . Rosuvastatin 36m68m tablet once daily  . Omega-3 (fish oil) 1000mg43mcapsule once daily  Plan Continue current medications  Coronary atherosclerosis   Patient has been on these medications in the past: aspirin  Patient is currently controlled on the following medications:  . Clopidogrel 75mg,61mablet once daily  . Nitroglycerin 0.4mg, 169mblet under tongue if needed  as directed   Plan Continue current medications  Memory deficit   Patient has failed these meds in past: none Patient is currently on the following medications:  . Donepezil 1m, 1 tablet at bedtime   Plan Continue current medications  GERD   Patient is currently controlled on the following medications:  . Famotidine 277m 1 tablet daily as needed for heartburn or indigestion  Plan Continue current medications   BPH   Patient has failed these meds in past: none  Patient is currently controlled on the following medications:  . alfuzosin 1052m1 tablet once daily . Finasteride 5mg2m tablet once daily   Plan Continue current medications  RLS   Patient has failed these meds in past: none Patient is currently controlled on the following medications:  . Pramipexole 0.5mg,43mto 3 tablets every night 1 hour before bedtime   Plan Continue current medications  Osteoarthritis   Patient is currently controlled on the following medications:  . Diclofenac 1% gel, apply 2 g four times daily  Plan Continue current medications   Vaccines   Reviewed patient's vaccination history.    Immunization History  Administered Date(s) Administered  . Influenza Split 04/09/2011, 04/07/2013  . Influenza Whole 03/21/2008  . Influenza, High Dose Seasonal PF 04/09/2014, 04/20/2016, 03/31/2017, 04/21/2018, 04/05/2019    . Influenza-Unspecified 03/23/2015  . PFIZER SARS-COV-2 Vaccination 07/14/2019, 08/04/2019  . Pneumococcal Polysaccharide-23 01/12/2020    Plan Address at follow up visit.   Medication Management  Patient organizes medications:  - patient unable to clarify medication management, however patient stated he does not use pill box.  - per son, patient tends to forget doses of medications; son reports patient has extra supply of medication [facility] Adherence: no gaps in refill history (per medication dispense history from 07/27/19 to 01/23/20)   Plan Reviewed [facility] enhanced pharmacy services and based on recent refill history, patient could start adherence packaging at the end of October.  Son to discuss further with patient and to call back if they want to proceed.     Follow up Follow up visit with PharmD in 1 month.   AnnetAnson CroftsrmD Clinical Pharmacist [facility])[phone]

## 2020-01-24 ENCOUNTER — Other Ambulatory Visit: Payer: Self-pay | Admitting: Family Medicine

## 2020-01-24 MED ORDER — DONEPEZIL HCL 10 MG PO TABS: 10.0000 mg | ORAL_TABLET | Freq: Every day | ORAL | 1 refills | Status: DC

## 2020-01-24 NOTE — Telephone Encounter (Signed)
Spoke with [name] and informed him of the message below. [name] stated the pt was to have a phone visit with the pharmacist yesterday and this did not happen.  I placed [name] on hold to check with our referral coordinator for a contact number for the neuro-psych evaluation and he hung up.  I called [name] back and left a detailed message stating the number to contact for the evaluation is  [facility] at [phone].  I also left a message stating per Cristie Hem it appears the phone visit was completed from yesterday and this message was sent to [facility] to see if she can contact the pts son.

## 2020-01-24 NOTE — Telephone Encounter (Signed)
I think it would be more beneficial to talk after he does the more elaborate neuro-psych eval that we referred for since that will give [place] better idea. He did have decrease on scoring, but I think he also was frustrated at end of eval. I did notice he was due for refill of the aricept, and have sent in a refill at 10mg  dose (increasing to typical daily dose) and once I see results from further testing we can discuss in more detail. If he wants to talk sooner, let me know.

## 2020-01-24 NOTE — Telephone Encounter (Signed)
CCM visit was completed with patient yesterday. Patient declined pharmacy enhanced services per office visit note.  Attempted to call patient's son [name]. Left message to return call to: [phone]

## 2020-01-25 NOTE — Telephone Encounter (Signed)
Spoke with patient's son, [name].  CCM visit note updated to reflect plan in regards to medication management.

## 2020-01-29 NOTE — Patient Instructions (Addendum)
Visit Information  Goals Addressed            This Visit's Progress   . Pharmacy Care Plan       CARE PLAN ENTRY (see longitudinal plan of care for additional care plan information)  Current Barriers:  . Chronic Disease Management support, education, and care coordination needs related to Hypertension, Hyperlipidemia, GERD, Osteoarthritis, BPH, and Memory deficit, RLS, coronary atherosclerosis   Hypertension BP Readings from Last 3 Encounters:  01/12/20 (!) 116/62  08/25/19 (!) 112/58  04/12/19 (!) 90/50   . Pharmacist Clinical Goal(s): o Over the next 90 days, patient will work with PharmD and providers to maintain BP goal <130/80 . Current regimen:   Amlodipine 10mg , 1 tablet once dialy  Losartan 50mg , 1 tablet twice daily  nebivolol (Bystolic) 5 mg, 0.5 tablets once daily . Patient self care activities - Over the next 90 days, patient will: o Check BP , document, and provide at future appointments o Ensure daily salt intake < 2300 mg/day  Hyperlipidemia Lab Results  Component Value Date/Time   LDLCALC 50 01/12/2020 09:27 AM   . Pharmacist Clinical Goal(s): o Over the next 90 days, patient will work with PharmD and providers to maintain LDL goal < 70 . Current regimen:  . Rosuvastatin 40mg , 1 tablet once daily  . Omega-3 (fish oil) 1000mg , 1 capsule once daily . Patient self care activities - Over the next 90 days, patient will: o Continue current medications as directed by provider.   Coronary atherosclerosis . Pharmacist Clinical Goal(s) o Over the next 90 days, patient will work with PharmD and providers to reduce reoccurrence of heart events . Current regimen:  . Clopidogrel 75mg , 1 tablet once daily  . Nitroglycerin 0.4mg , 1 tablet under tongue if needed as directed  . Patient self care activities - Over the next 90 days, patient will: o Continue current medications as directed by provider.   Memory deficit . Pharmacist Clinical Goal(s) o Over the next  90 days, patient will work with PharmD and providers to reduce worsening of memory.  . Current regimen:  o Donepezil 5mg , 1 tablet at bedtime  . Patient self care activities o Patient will continue current medications as directed by providers.  GERD . Pharmacist Clinical Goal(s) o Over the next 90 days, patient will work with PharmD and providers to reduce heartburn symptoms.  . Current regimen:  o Famotidine 20mg , 1 tablet daily as needed for heartburn or indigestion . Patient self care activities o Patient will continue current medications as directed by provider.   Benign prostatic hyperplasia . Pharmacist Clinical Goal(s) o Over the next 90 days, patient will work with PharmD and providers to reduce symptoms of enlarged prostate.  . Current regimen:  . alfuzosin 10mg , 1 tablet once daily . Finasteride 5mg , 1 tablet once daily  . Patient self care activities o Patient will continue current medications as directed by provider   Restless legs syndrome (RLS)  . Pharmacist Clinical Goal(s) o Over the next 90 days, patient will work with PharmD and providers to reduce symptoms.  . Current regimen:  o Pramipexole 0.5mg , 2 to 3 tablets every night 1 hour before bedtime . Patient self care activities o Patient will continue current medications as directed by provider  Osteoarthritis . Pharmacist Clinical Goal(s) o Over the next 90 days, patient will work with PharmD and providers to minimize pain . Current regimen:  o Diclofenac 1% gel, apply 2 g four times daily . Patient self  care activities o Patient will continue current medications as directed by provider   Medication management . Pharmacist Clinical Goal(s): o Over the next 90 days, patient will work with PharmD and providers to achieve optimal medication adherence . Current pharmacy: [facility] . Interventions o Comprehensive medication review performed. o Continue current medication management strategy o Reviewed  [facility] pharmacy services for medication synchronization, packaging and delivery . Patient self care activities - Over the next 90 days, patient will: o Take medications as prescribed o Report any questions or concerns to PharmD and/or provider(s)  Initial goal documentation        Willie Lynch was given information about Chronic Care Management services today including:  1. CCM service includes personalized support from designated clinical staff supervised by his physician, including individualized plan of care and coordination with other care providers 2. 24/7 contact phone numbers for assistance for urgent and routine care needs. 3. Standard insurance, coinsurance, copays and deductibles apply for chronic care management only during months in which we provide at least 20 minutes of these services. Most insurances cover these services at 100%, however patients may be responsible for any copay, coinsurance and/or deductible if applicable. This service may help you avoid the need for more expensive face-to-face services. 4. Only one practitioner may furnish and bill the service in a calendar month. 5. The patient may stop CCM services at any time (effective at the end of the month) by phone call to the office staff.  Patient agreed to services and verbal consent obtained.   The patient verbalized understanding of instructions provided today and agreed to receive a mailed copy of patient instruction and/or educational materials. The pharmacy team will reach out to the patient again over the next 30 days.   [name], PharmD, BCACP Clinical Pharmacist [facility] [place] [phone]   Heart-Healthy Eating Plan Heart-healthy meal planning includes:  Eating less unhealthy fats.  Eating more healthy fats.  Making other changes in your diet. Talk with your doctor or a diet specialist (dietitian) to create an eating plan that is right for you. What is my  plan? Your doctor may recommend an eating plan that includes:  Total fat: ______% or less of total calories a day.  Saturated fat: ______% or less of total calories a day.  Cholesterol: less than _________mg a day. What are tips for following this plan? Cooking Avoid frying your food. Try to bake, boil, grill, or broil it instead. You can also reduce fat by:  Removing the skin from poultry.  Removing all visible fats from meats.  Steaming vegetables in water or broth. Meal planning   At meals, divide your plate into four equal parts: ? Fill one-half of your plate with vegetables and green salads. ? Fill one-fourth of your plate with whole grains. ? Fill one-fourth of your plate with lean protein foods.  Eat 4-5 servings of vegetables per day. A serving of vegetables is: ? 1 cup of raw or cooked vegetables. ? 2 cups of raw leafy greens.  Eat 4-5 servings of fruit per day. A serving of fruit is: ? 1 medium whole fruit. ?  cup of dried fruit. ?  cup of fresh, frozen, or canned fruit. ?  cup of 100% fruit juice.  Eat more foods that have soluble fiber. These are apples, broccoli, carrots, beans, peas, and barley. Try to get 20-30 g of fiber per day.  Eat 4-5 servings of nuts, legumes, and seeds per week: ?  1 serving of dried beans or legumes equals  cup after being cooked. ? 1 serving of nuts is  cup. ? 1 serving of seeds equals 1 tablespoon. General information  Eat more home-cooked food. Eat less restaurant, buffet, and fast food.  Limit or avoid alcohol.  Limit foods that are high in starch and sugar.  Avoid fried foods.  Lose weight if you are overweight.  Keep track of how much salt (sodium) you eat. This is important if you have high blood pressure. Ask your doctor to tell you more about this.  Try to add vegetarian meals each week. Fats  Choose healthy fats. These include olive oil and canola oil, flaxseeds, walnuts, almonds, and seeds.  Eat more  omega-3 fats. These include salmon, mackerel, sardines, tuna, flaxseed oil, and ground flaxseeds. Try to eat fish at least 2 times each week.  Check food labels. Avoid foods with trans fats or high amounts of saturated fat.  Limit saturated fats. ? These are often found in animal products, such as meats, butter, and cream. ? These are also found in plant foods, such as palm oil, palm kernel oil, and coconut oil.  Avoid foods with partially hydrogenated oils in them. These have trans fats. Examples are stick margarine, some tub margarines, cookies, crackers, and other baked goods. What foods can I eat? Fruits All fresh, canned (in natural juice), or frozen fruits. Vegetables Fresh or frozen vegetables (raw, steamed, roasted, or grilled). Green salads. Grains Most grains. Choose whole wheat and whole grains most of the time. Rice and pasta, including brown rice and pastas made with whole wheat. Meats and other proteins Lean, well-trimmed beef, veal, pork, and lamb. Chicken and [place] without skin. All fish and shellfish. Wild duck, rabbit, pheasant, and venison. Egg whites or low-cholesterol egg substitutes. Dried beans, peas, lentils, and tofu. Seeds and most nuts. Dairy Low-fat or nonfat cheeses, including ricotta and mozzarella. Skim or 1% milk that is liquid, powdered, or evaporated. Buttermilk that is made with low-fat milk. Nonfat or low-fat yogurt. Fats and oils Non-hydrogenated (trans-free) margarines. Vegetable oils, including soybean, sesame, sunflower, olive, peanut, safflower, corn, canola, and cottonseed. Salad dressings or mayonnaise made with a vegetable oil. Beverages Mineral water. Coffee and tea. Diet carbonated beverages. Sweets and desserts Sherbet, gelatin, and fruit ice. Small amounts of dark chocolate. Limit all sweets and desserts. Seasonings and condiments All seasonings and condiments. The items listed above may not be a complete list of foods and drinks you can  eat. Contact a dietitian for more options. What foods should I avoid? Fruits Canned fruit in heavy syrup. Fruit in cream or butter sauce. Fried fruit. Limit coconut. Vegetables Vegetables cooked in cheese, cream, or butter sauce. Fried vegetables. Grains Breads that are made with saturated or trans fats, oils, or whole milk. Croissants. Sweet rolls. Donuts. High-fat crackers, such as cheese crackers. Meats and other proteins Fatty meats, such as hot dogs, ribs, sausage, bacon, rib-eye roast or steak. High-fat deli meats, such as salami and bologna. Caviar. Domestic duck and goose. Organ meats, such as liver. Dairy Cream, sour cream, cream cheese, and creamed cottage cheese. Whole-milk cheeses. Whole or 2% milk that is liquid, evaporated, or condensed. Whole buttermilk. Cream sauce or high-fat cheese sauce. Yogurt that is made from whole milk. Fats and oils Meat fat, or shortening. Cocoa butter, hydrogenated oils, palm oil, coconut oil, palm kernel oil. Solid fats and shortenings, including bacon fat, salt pork, lard, and butter. Nondairy cream substitutes. Salad dressings with  cheese or sour cream. Beverages Regular sodas and juice drinks with added sugar. Sweets and desserts Frosting. Pudding. Cookies. Cakes. Pies. Milk chocolate or white chocolate. Buttered syrups. Full-fat ice cream or ice cream drinks. The items listed above may not be a complete list of foods and drinks to avoid. Contact a dietitian for more information. Summary  Heart-healthy meal planning includes eating less unhealthy fats, eating more healthy fats, and making other changes in your diet.  Eat a balanced diet. This includes fruits and vegetables, low-fat or nonfat dairy, lean protein, nuts and legumes, whole grains, and heart-healthy oils and fats. This information is not intended to replace advice given to you by your health care provider. Make sure you discuss any questions you have with your health care  provider. Document Revised: 08/12/2017 Document Reviewed: 07/16/2017 Elsevier Patient Education  2020 [employer].

## 2020-02-02 ENCOUNTER — Encounter: Payer: Self-pay | Admitting: Counselor

## 2020-02-19 ENCOUNTER — Other Ambulatory Visit (HOSPITAL_COMMUNITY): Payer: Self-pay | Admitting: Cardiology

## 2020-02-28 ENCOUNTER — Encounter: Payer: Medicare Other | Admitting: Counselor

## 2020-03-06 ENCOUNTER — Encounter: Payer: Medicare Other | Admitting: Counselor

## 2020-03-08 ENCOUNTER — Encounter: Payer: Self-pay | Admitting: Counselor

## 2020-03-08 ENCOUNTER — Ambulatory Visit: Payer: Medicare Other

## 2020-03-08 ENCOUNTER — Ambulatory Visit (INDEPENDENT_AMBULATORY_CARE_PROVIDER_SITE_OTHER): Payer: Medicare Other | Admitting: Counselor

## 2020-03-08 ENCOUNTER — Other Ambulatory Visit: Payer: Self-pay

## 2020-03-08 DIAGNOSIS — R413 Other amnesia: Secondary | ICD-10-CM

## 2020-03-08 DIAGNOSIS — F09 Unspecified mental disorder due to known physiological condition: Secondary | ICD-10-CM | POA: Diagnosis not present

## 2020-03-08 NOTE — Progress Notes (Signed)
[facility]  Patient Name: Willie Lynch MRN: [id number] Date of Birth: 1933/12/22 Age: 86 y.o. Education: 16 years  Referral Circumstances and Background Information  Willie Lynch is a 86 y.o., right-hand dominant, widowed man with a history of HTN, CAD, HLD, and memory and thinking problems that were noticed toward the beginning of this year. He is a patient of Dr. Betsy Coder and was referred for comprehensive evaluation in the service of treatment planning and diagnostic clarity. He presented to clinic with his son Willie Lynch today, who supplemented the history provided by the patient. The patient's son was clearly uncomfortable talking about the patient's cognitive problems in front of him although we were able to talk briefly while he completed testing.   On interview, the patient himself reported that his memory problems "aren't that obvious" to him and presented as fairly unconcerned. His son Willie Lynch is more concerned, and noticed problems toward the beginning of 2021. At that time, he noticed that his father was repeating himself and telling the same stories multiple times. It's not clear that he is forgetting entire events but he does get confused about things. There isn't rapid forgetting of information but over the course of a day, he will forget things. He has some difficulties with directions, he was going to [employer] the other day and forgot how to get there. He did go back home, because he knows how to get there from his house, and was able to get there after that. He does lose the month occasionally but not the year. He doesn't have significant problems with word finding, and he has no significant personality changes, visual hallucinations, or suspiciousness. With respect to mood, the patient reported that he is "pretty good," and his son agrees. He reported that his energy is fair, not what it used to be, but still decent. He sleeps well, typically 6  or 7 hours. His appetite is good and he has no major weight changes. With respect to activities, he enjoys reading a lot, he watches TV, he also does puzzles, and he does sudoku and other games in the paper. He has a good social network he thinks and sees friends a few times a week.   With respect to functioning, it sounds as though the patient is still doing fairly well. His son has had concerns about his medications and reported that there has been some confusion with them, the patient's other son Willie Lynch had to help him get his Aricept filled. He is still managing his own finances and has no problems doing so, although his son isn't checking and there could be subtle problems. The patient isn't having any significant problems with driving, he has a place at [facility] and he drives down there himself, although it has been shaky some times as of late according to the patient's son. He is essentially independent around the house, he doesn't cook much for himself and tends to eat out. He eats eggs, cereal, sandwiches and other easy to prepare foods. He goes out for dinner most nights. He is still entirely independent with respect to community utilization. 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 They haven't noticed any significant changes in his judgment and problem solving, although  given his reaction to the testing I think there may some changes in his social judgment (became extremely aggravated and almost had to discontinue as per technician).    Past Medical History and Review of Relevant Studies   Patient Active Problem List   Diagnosis Date Noted  . Gallstone 05/21/2016  . BPH (benign prostatic hyperplasia) 11/08/2013  . GERD (gastroesophageal reflux  disease) 07/08/2012  . RLS (restless legs syndrome) 07/08/2012  . ED (erectile dysfunction) 01/08/2012  . Dyspnea 02/18/2011  . Chest pain 12/09/2010  . ISCHEMIC CARDIOMYOPATHY 08/09/2009  . Fatigue 02/05/2009  . HYPERCHOLESTEROLEMIA 11/12/2008  . Essential hypertension 11/12/2008  . CORONARY ATHEROSCLEROSIS NATIVE CORONARY ARTERY 11/12/2008  . ALLERGIC URTICARIA 11/24/2007  . DIVERTICULOSIS OF COLON 08/23/2007  . RENAL CYST 08/23/2007  . LOW BACK PAIN, CHRONIC 08/23/2007  . ELEVATED PROSTATE SPECIFIC ANTIGEN 08/23/2007  . Allergic rhinitis 07/22/2007  . IRRITABLE BOWEL SYNDROME 07/22/2007  . Osteoarthritis 07/22/2007   Review of Neuroimaging and Relevant Medical History: There is no neuroimaging on file for review.   Current Outpatient Medications  Medication Sig Dispense Refill  . alfuzosin (UROXATRAL) 10 MG 24 hr tablet Take 10 mg by mouth daily.      Marland Kitchen amLODipine (NORVASC) 10 MG tablet TAKE 1 TABLET BY MOUTH EVERY DAY 90 tablet 1  . aspirin (ASPIR-LOW) 81 MG EC tablet Take 162 mg by mouth daily.  (Patient not taking: Reported on 01/24/2020)    . clopidogrel (PLAVIX) 75 MG tablet TAKE 1 TABLET(75 MG) BY MOUTH DAILY 90 tablet 3  . diclofenac Sodium (VOLTAREN) 1 % GEL Apply 2 g topically 4 (four) times daily. 100 g 2  . donepezil (ARICEPT) 10 MG tablet Take 1 tablet (10 mg total) by mouth at bedtime. 90 tablet 1  . famotidine (PEPCID) 20 MG tablet Take 1 tablet (20 mg total) by mouth daily as needed for heartburn or indigestion. 30 tablet 2  . finasteride (PROSCAR) 5 MG tablet Take 5 mg by mouth daily.      Marland Kitchen losartan (COZAAR) 50 MG tablet TAKE 1 TABLET(50 MG) BY MOUTH TWICE DAILY 180 tablet 3  . Multiple Vitamins-Minerals (MULTIVITAL) tablet Take 1 tablet by mouth daily.      . nebivolol (BYSTOLIC) 5 MG tablet Take 0.5 tablets (2.5 mg total) by mouth daily. 45 tablet 3  . nitroGLYCERIN (NITROSTAT) 0.4 MG SL tablet DISSOLVE 1 TABLET UNDER THE TONGUE IF NEEDED AS DIRECTED. 25 tablet  3  . Omega-3 Fatty Acids (FISH OIL) 1000 MG CAPS Take 1 capsule by mouth daily.     . pramipexole (MIRAPEX) 0.5 MG tablet TAKE 2 TO 3 TABLETS BY MOUTH EVERY NIGHT 1 HOUR BEFORE BEDTIME 180 tablet 1  . rosuvastatin (CRESTOR) 40 MG tablet Take 1 tablet (40 mg total) by mouth daily. 90 tablet 3  . timolol (TIMOPTIC-XR) 0.5 % ophthalmic gel-forming Place 1 drop into both eyes every morning.  0  . TRAVATAN Z 0.004 % SOLN ophthalmic solution Place 1 drop into both eyes every evening.  0   No current facility-administered medications for this visit.   Family History  Problem Relation Age of Onset  . Heart disease Father        smoker  . Heart attack Father   . Cancer Mother   . Cancer Other        ?? not sure what kind   There is no  family history of dementia. His father died in his 60s and his mother died in her 49s.  There is no  family history of psychiatric illness.  Psychosocial History  Developmental, Educational and Employment History: The patient is a native of [facility] [place] [facility]. 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 He reported that he was never held back and didn't have any specific learning issues or difficulties. He went to [employer] and earned a Water quality scientist in history. He joined the Film/video editor, for about 2 years. 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 He worked for [employer] in [employer], and did seminars around the country. It sounds as though he enjoyed his job very much. He retired just about 20 years ago, when he was 89.   Psychiatric History: The patient has no significant history of psychiatric difficulties.   Substance Use History: The patient is a former smoker, he quit many years ago. He enjoys drinking in moderation but not to excess.   Relationship History and Living Cimcumstances: The patient was married  for about 26 years, he has two sons. His son Willie Lynch lives in [place] and his other son lives in [place].   Mental Status and Behavioral Observations  Sensorium/Arousal: The patient's level of arousal was awake and alert. Hearing and vision were adequate for testing purposes. Orientation: The patient was oriented to person, place, and situation but reported the date as October 14th. He initially stated the current president was Trump but then corrected himself.  Appearance: The patient was dressed neatly in appropriate, casual clothing.  Behavior: The patient was generally pleasant and appropriate although did become quite irate during testing, to the point that the technician had to take a break and nearly discontinued. This behavior continued throughout the battery, mainly on memory tests.  Speech/language: Speech was normal in rate, rhythm, volume, and prosody.  Gait/Posture: Appeared narrow based with reasonable arm swing on observation of ambulation between rooms.  Movement: No tremor, bradykinesia, hypokinesia Social Comportment: The patient was pleasant and appropriate during the interview but was quite irate during testing.  Mood: "Pretty good" Affect: Mainly neutral, although reportedly agitated during testing Thought process/content: Thought process was logical, linear, and goal-directed for the most part although he did have a loss of set error on verbal fluency. Thought content was appropriate. There was a tendency toward minimization of cognitive problems.  Safety: No thoughts of harming himself or others noted during the present encounter, no other safety concerns.  Insight: Diminished  Montreal Cognitive Assessment  03/08/2020 01/12/2020 08/27/2019  Visuospatial/ Executive (0/5) 3 3 4   Naming (0/3) 3 2 2   Attention: Read list of digits (0/2) 2 2 2   Attention: Read list of letters (0/1) 1 1 1   Attention: Serial 7 subtraction starting at 100 (0/3) 3 3 3   Language: Repeat phrase  (0/2) 1 2 2   Language : Fluency (0/1) 0 1 1  Abstraction (0/2) 2 2 2   Delayed Recall (0/5) 0 0 0  Orientation (0/6) 4 3 6   Total 19 19 23   Adjusted Score (based on education) 19 19 23    Test Procedures  Wide Range Achievement Test - 4             Word Reading Neuropsychological Assessment Battery  List Learning  Story Learning  Daily Living Memory  Naming  Digit Span Repeatable Battery for the Assessment of Neuropsychological Status (Form A)  Figure Copy  Judgment of Line Orientation  Coding  Figure Recall The Dot Counting Test A Random Letter Test Controlled Oral Word Association (F-A-S) Semantic Fluency (Animals) Trail Making Test A & B Complex Ideational Material Modified [place] Card Sorting Test Geriatric Depression Scale - Short Form Quick Dementia Rating System (completed by son, Willie Lynch)  Plan  Willie Lynch was seen for a psychiatric diagnostic evaluation and neuropsychological testing. He is a pleasant, 86 year old, right-hand dominant many with a history of HTN and memory loss noticed mainly after the past year. His son Willie Lynch accompanied him to the visit today and supplemented the history, he has noticed memory loss, including rapid forgetting of information. The patient is ostensibly holding his own functionally but there have been some minor changes in his ability to do things such as handle medications (had to get help filling Aricept from son Willie Lynch), he gets turned around driving from time to time (but is still essentially performing adequately), and there is no oversight of his finances. His friends have noticed his difficulties and are concerned. Full and complete note with impressions, recommendations, and interpretation of test data to follow.   Viviano Simas Nicole Kindred, PsyD, [place] Clinical Neuropsychologist  Informed Consent and Coding/Compliance  Risks and benefits of the evaluation were discussed with the patient prior to all testing procedures. I conducted a  clinical interview and neuropsychological testing (at least two tests) with Burr Medico and Lamar Benes, B.S. (Technician) administered additional test procedures. The patient was able to tolerate the testing procedures and the patient (and/or family if applicable) is likely to benefit from further follow up to receive the diagnosis and treatment recommendations, which will be rendered at the next encounter. Billing below reflects technician time, my direct face-to-face time with the patient, time spent in test administration, and time spent in professional activities including but not limited to: neuropsychological test interpretation, integration of neuropsychological test data with clinical history, report preparation, treatment planning, care coordination, and review of diagnostically pertinent medical history or studies.   Services associated with this encounter: Clinical Interview [phone]) plus 60 minutes (21308; Neuropsychological Evaluation by Professional)  160 minutes (65784; Neuropsychological Evaluation by Professional, Adl.) 18 minutes (69629; Test Administration by Professional) 30 minutes (52841; Neuropsychological Testing by Technician) 50 minutes (32440; Neuropsychological Testing by Technician, Adl.)

## 2020-03-08 NOTE — Progress Notes (Signed)
   Psychometrist Note   Cognitive testing was administered to Willie Lynch by Lamar Benes, B.S. (Technician) under the supervision of Alphonzo Severance, Psy.D., ABN. Willie Lynch was able to tolerate all test procedures. Dr. Nicole Kindred met with the patient as needed to manage any emotional reactions to the testing procedures. Rest breaks were offered.    The battery of tests administered was selected by Dr. Nicole Kindred with consideration to the patient's current level of functioning, the nature of his symptoms, emotional and behavioral responses during the interview, level of literacy, observed level of motivation/effort, and the nature of the referral question. This battery was communicated to the psychometrist. Communication between Dr. Nicole Kindred and the psychometrist was ongoing throughout the evaluation and Dr. Nicole Kindred was immediately accessible at all times. Dr. Nicole Kindred provided supervision to the technician on the date of this service, to the extent necessary to assure the quality of all services provided.    Willie Lynch will return in approximately one week for an interactive feedback session with Dr. Nicole Kindred, at which time test performance, clinical impressions, and treatment recommendations will be reviewed in detail. The patient understands he can contact our office should he require our assistance before this time.   A total of 80 minutes of billable time were spent with Willie Lynch by the technician, including test administration and scoring time. Billing for these services is reflected in Dr. Les Pou note.   This note reflects time spent with the psychometrician and does not include test scores, clinical history, or any interpretations made by Dr. Nicole Kindred. The full report will follow in a separate note.

## 2020-03-11 NOTE — Progress Notes (Signed)
[facility]  Patient Name: Willie Lynch MRN: [id number] Date of Birth: 1934/04/27 Age: 86 y.o. Education: 16 years  Measurement properties of test scores: IQ, Index, and Standard Scores (SS): Mean = 100; Standard Deviation = 15 Scaled Scores (Ss): Mean = 10; Standard Deviation = 3 Z scores (Z): Mean = 0; Standard Deviation = 1 T scores (T); Mean = 50; Standard Deviation = 10  TEST SCORES:    Note: This summary of test scores accompanies the interpretive report and should not be interpreted by unqualified individuals or in isolation without reference to the report. Test scores are relative to age, gender, and educational history as available and appropriate.   Performance Validity        "A" Random Letter Test Raw  Descriptor      Errors 0 Within Expectation  The Dot Counting Test: 10 Within Expectation      Mental Status Screening     Total Score Descriptor  MoCA 19 Mild Dementia      Expected Functioning        Wide Range Achievement Test: Standard/Scaled Score Percentile      Word Reading 104 61      Attention/Processing Speed        Neuropsychological Assessment Battery (Attention Module, Form 1): T-score Percentile      Digits Forward 55 69      Digits Backwards 46 34      Repeatable Battery for the Assessment of Neuropsychological Status (Form A): Scaled Score Percentile      Coding 11 63      Language        Neuropsychological Assessment Battery (Language Module, Form 1): T-score Percentile      Naming   (31) 63 91      Verbal Fluency: T-score Percentile      Controlled Oral Word Association (F-A-S) 43 25      Semantic Fluency (Animals) 50 50      Memory:        Neuropsychological Assessment Battery (Memory Module, Form 1): T-score Percentile      List Learning           List A Immediate Recall   (2, 3, 4) 20 <1         List B Immediate Recall   (3) 44 27         List A Short Delayed Recall   (0) 20 <1          List A Long Delayed Recall   (0) 27 1         List A Percent Retention   (0 %) --- <1         List A Long Delayed Yes/No Recognition Hits   (10) --- 31         List A Long Delayed Yes/No Recognition False Alarms   (8) --- 25         List A Recognition Discriminability Index --- 16     Story Learning           Immediate Recall   (12, 22) 30 2         Delayed Recall   (0) 30 2         Percent Retention   (0 %) --- <1      Daily Living Memory            Immediate Recall   (18, 11) 35 7  Delayed Recall   (2, 1) 19 <1          Percent Retention (38 %) --- 2          Recognition Hits    (7) --- 25      Repeatable Battery for the Assessment of Neuropsychological Status (Form A): Scaled Score Percentile         Figure Recall   (0) 1 <1      Visuospatial/Constructional Functioning        Repeatable Battery for the Assessment of Neuropsychological Status (Form A): Standard/Scaled Score Percentile     Visuospatial/Constructional Index 121 92         Figure Copy   (20) 14 91         Judgment of Line Orientation   (17) --- 51-75      Executive Functioning        Modified [employer] Test (MWCST): Standard/T-Score Percentile      Number of Categories Correct 40 16      Number of Perseverative Errors 69 97      Number of Total Errors 48 42      Percent Perseverative Errors 67 96  Executive Function Composite 107 68      Trail Making Test: T-Score Percentile      Part A 55 69      Part B 65 93      Boston Diagnostic Aphasia Exam: Raw Score Scaled Score      Complex Ideational Material 10 7      Clock Drawing Raw Score Descriptor      Command 9 WNL      Rating Scales        Clinical Dementia Rating Raw Score Descriptor      Sum of Boxes 4.0 Very Mild Dementia      Global Score 0.5 MCI      Quick Dementia Rating System Raw Score Descriptor      Sum of Boxes 2.5 Very Mild Dementia      Total Score 4.5 MCI  Geriatric Depression Scale - Short Form 1 Negative    Kjersten Ormiston V. Nicole Kindred PsyD, [place] Clinical Neuropsychologist

## 2020-03-15 ENCOUNTER — Other Ambulatory Visit: Payer: Self-pay

## 2020-03-15 ENCOUNTER — Ambulatory Visit (INDEPENDENT_AMBULATORY_CARE_PROVIDER_SITE_OTHER): Payer: Medicare Other | Admitting: Counselor

## 2020-03-15 ENCOUNTER — Encounter: Payer: Self-pay | Admitting: Counselor

## 2020-03-15 DIAGNOSIS — F0390 Unspecified dementia without behavioral disturbance: Secondary | ICD-10-CM

## 2020-03-15 DIAGNOSIS — G301 Alzheimer's disease with late onset: Secondary | ICD-10-CM

## 2020-03-15 NOTE — Progress Notes (Signed)
[facility]  Telemedicine statement:  I discussed the limitations of neuropsychological care via telemedicine and the availability of in person appointments. The patient expressed understanding and agreed to proceed. The patient was verified with two identifiers.  The visit modality was: telephonic The patient location was: home The provider location was: office  The following individuals participated: Haroon Shatto Marti Sleigh  Feedback Note: I met with Burr Medico to review the findings resulting from his neuropsychological evaluation. Since the last appointment, he has had a hard time. He apparently sent his sons an e-mail about what a hard day he was having mid week, in terms of having increased memory difficulties and feeling negative about living alone. He stayed with his companion Manuela Schwartz for a few days and has now returned home. He did not recall having written that letter. Time was spent reviewing the impressions and recommendations that are detailed in the evaluation report. We discussed impression of significant memory storage problems, likely reaching a very mild dementia level function. I encouraged him to allow his sons to check behind with finances, and to step up efforts in supporting his medication adherence. We also discussed the progressive nature of the illness and that other living circumstances may be preferable as time goes on. He was amenable to those interventions. Also discussed MIND diet and exercise, which he had interest in. I took time to explain the findings and answer all the patient's questions. I encouraged Mr. Worthing to contact me should he have any further questions or if further follow up is desired.   Current Medications and Medical History   Current Outpatient Medications  Medication Sig Dispense Refill   alfuzosin (UROXATRAL) 10 MG 24 hr tablet Take 10 mg by mouth daily.       amLODipine (NORVASC)  10 MG tablet TAKE 1 TABLET BY MOUTH EVERY DAY 90 tablet 1   aspirin (ASPIR-LOW) 81 MG EC tablet Take 162 mg by mouth daily.  (Patient not taking: Reported on 01/24/2020)     clopidogrel (PLAVIX) 75 MG tablet TAKE 1 TABLET(75 MG) BY MOUTH DAILY 90 tablet 3   diclofenac Sodium (VOLTAREN) 1 % GEL Apply 2 g topically 4 (four) times daily. 100 g 2   donepezil (ARICEPT) 10 MG tablet Take 1 tablet (10 mg total) by mouth at bedtime. 90 tablet 1   famotidine (PEPCID) 20 MG tablet Take 1 tablet (20 mg total) by mouth daily as needed for heartburn or indigestion. 30 tablet 2   finasteride (PROSCAR) 5 MG tablet Take 5 mg by mouth daily.       losartan (COZAAR) 50 MG tablet TAKE 1 TABLET(50 MG) BY MOUTH TWICE DAILY 180 tablet 3   Multiple Vitamins-Minerals (MULTIVITAL) tablet Take 1 tablet by mouth daily.       nebivolol (BYSTOLIC) 5 MG tablet Take 0.5 tablets (2.5 mg total) by mouth daily. 45 tablet 3   nitroGLYCERIN (NITROSTAT) 0.4 MG SL tablet DISSOLVE 1 TABLET UNDER THE TONGUE IF NEEDED AS DIRECTED. 25 tablet 3   Omega-3 Fatty Acids (FISH OIL) 1000 MG CAPS Take 1 capsule by mouth daily.      pramipexole (MIRAPEX) 0.5 MG tablet TAKE 2 TO 3 TABLETS BY MOUTH EVERY NIGHT 1 HOUR BEFORE BEDTIME 180 tablet 1   rosuvastatin (CRESTOR) 40 MG tablet Take 1 tablet (40 mg total) by mouth daily. 90 tablet 3   timolol (TIMOPTIC-XR) 0.5 % ophthalmic gel-forming Place 1 drop into both eyes every morning.  0   TRAVATAN Z 0.004 % SOLN ophthalmic solution Place 1 drop into both eyes every evening.  0   No current facility-administered medications for this visit.    Patient Active Problem List   Diagnosis Date Noted   Gallstone 05/21/2016   BPH (benign prostatic hyperplasia) 11/08/2013   GERD (gastroesophageal reflux disease) 07/08/2012   RLS (restless legs syndrome) 07/08/2012   ED (erectile dysfunction) 01/08/2012   Dyspnea 02/18/2011   Chest pain 12/09/2010   ISCHEMIC CARDIOMYOPATHY 08/09/2009    Fatigue 02/05/2009   HYPERCHOLESTEROLEMIA 11/12/2008   Essential hypertension 11/12/2008   CORONARY ATHEROSCLEROSIS NATIVE CORONARY ARTERY 11/12/2008   ALLERGIC URTICARIA 11/24/2007   DIVERTICULOSIS OF COLON 08/23/2007   RENAL CYST 08/23/2007   LOW BACK PAIN, CHRONIC 08/23/2007   ELEVATED PROSTATE SPECIFIC ANTIGEN 08/23/2007   Allergic rhinitis 07/22/2007   IRRITABLE BOWEL SYNDROME 07/22/2007   Osteoarthritis 07/22/2007    Mental Status and Behavioral Observations  ZIARE CRYDER was available at the prespecified time for this telephonic appointment and was alert and generally oriented (orientation not formally assessed). Speech was normal in rate, rhythm, volume, and prosody. Self-reported mood was "good" and affect as assessed by vocal quality was mainly neutral. Thought process was logical and goal oriented, although he clearly had notable forgetting and did not present as familiar with me from our last appointment. Thought content was appropriate. There were no safety concerns identified at today's encounter, such as thoughts of harming self or others.   Plan  Feedback provided regarding the patient's neuropsychological evaluation. He will work with his sons to make sure nothing is being missed with finances, medications, and they are considering the possibility of other living circumstances such as something closer to his sons or another setting with assistance nearby if it becomes needed. I have no major concerns about his driving at this time. Also discussed MRI of brain, which might be helpful to rule out alternative etiologies, assess atrophy, and make sure nothing is being missed as part of a complete reversible causes workup. RAYSHON ALBAUGH was encouraged to contact me if any questions arise or if further follow up is desired.   Viviano Simas Nicole Kindred, PsyD, ABN Clinical Neuropsychologist  Service(s) Provided at This Encounter: 42 minutes [phone]; Conjoint therapy with  patient present)

## 2020-03-15 NOTE — Patient Instructions (Signed)
Your performance and presentation on testing was consistent with a meaningful profile of strengths and weaknesses. Specifically, you had a very hard time with memory testing and appear to have what is called a "memory storage problem," which typically manifests with difficulties acquiring and retaining information across time. You did well in all other areas. This is good, and suggests that at this point, your cognitive difficulties are quite focal and you have a lot of remaining strengths. Nevertheless, I do think that the extent of the problem does reach a dementia level, while also acknowledging that the distinction between mild cognitive impairment and dementia in your case is somewhat arbitrary. I think that very mild dementia is likely the best term for your current level of problem.   Dementia refers to a group of syndromes where multiple areas of ability are damaged in the brain, such as memory, thinking, judgment, and behavior, and most commonly refers to age related causes of dementia that cause worsening in these abilities over time. Alzheimer's disease is the most common form of dementia in people over the age of 85. Not all dementias are Alzheimer's disease, but all Alzheimer's disease is dementia. When dementia is due to an underlying condition affecting the brain, such as Alzheimer's disease, there is progression over time, which typically procedes gradually over many years.   In your case, I think that Alzheimer's disease is the likely cause of your problem. This is given your demographics, test findings, and clinical history. Dr. Idelle Jo has already done a reversible lab workup for dementia and is working on your B12 with you, which could be contributing but is unlikely the sole cause of your problem. An MRI of the brain could also be considered at Dr. Betsy Coder discretion as part of a full and complete workup and to make sure that nothing is being missed. This would allow [place] to look for  areas of vascular disease and also areas of shrinkage, which can sometimes occur with Alzheimer's.   I am concerned given your cognitive test data that you have reached a point where some increased monitoring of your affairs might be beneficial. This could include with complex things, such as medication adherence, and also finances. I would like to be clear that I did not specifically evaluate your decision making capacity as part of the present evaluation and this is not a formal statement of incapacity, because it is quite possible that you do not need help with those things. It would simply be prudent to allow your family to check to make sure that nothing is being missed and that you are taking all medications as prescribed.   I would recommend that you consult with an elder care attorney regarding advanced directives if you have not, such as a healthcare power of attorney and living will. Consultation with an elder care attorney can help you protect your estate and communicate your preferences to your loved ones formally, in the event you are not able to do so yourself. I can provide my strong recommendation for the [employer], who are located at [address] in [place], [place]. They can be reached at [phone]. They also have a website [email or web address].   You are already on a good front line medication for Alzheimer's, namely Aricept. Combination therapy with Aricept and Namenda is typically reserved for cases in the more moderate to late mild level of progression and/or for specific reasons in select cases.  At this point, I do not  have specific concerns about your ability to safely operate a motor vehicle, as most individuals at a very mild dementia level of progression are able to drive safely.   There is now good quality evidence from at least one large scale study that a modified mediterranean diet may help slow cognitive decline. This is known as the "MIND" diet. The  Mind diet is not so much a specific diet as it is a set of recommendations for things that you should and should not eat.   Foods that are ENCOURAGED on the MIND Diet:  Green, leafy vegetables: Aim for six or more servings per week. This includes kale, spinach, cooked greens and salads.  All other vegetables: Try to eat another vegetable in addition to the green leafy vegetables at least once a day. It is best to choose non-starchy vegetables because they have a lot of nutrients with a low number of calories.  Berries: Eat berries at least twice a week. There is a plethora of research on strawberries, and other berries such as blueberries, raspberries and blackberries have also been found to have antioxidant and brain health benefits.  Nuts: Try to get five servings of nuts or more each week. The creators of the [place] don't specify what kind of nuts to consume, but it is probably best to vary the type of nuts you eat to obtain a variety of nutrients. Peanuts are a legume and do not fall into this category.  Olive oil: Use olive oil as your main cooking oil. There may be other heart-healthy alternatives such as algae oil, though there is not yet sufficient research upon which to base a formal recommendation.  Whole grains: Aim for at least three servings daily. Choose minimally processed grains like oatmeal, quinoa, brown rice, whole-wheat pasta and 100% whole-wheat bread.  Fish: Eat fish at least once a week. It is best to choose fatty fish like salmon, sardines, trout, tuna and mackerel for their high amounts of omega-3 fatty acids.  Beans: Include beans in at least four meals every week. This includes all beans, lentils and soybeans.  Poultry: Try to eat chicken or [place] at least twice a week. Note that fried chicken is not encouraged on the MIND diet.  Wine: Aim for no more than one glass of alcohol daily. Both red and white wine may benefit the brain. However, much research has focused on the  red wine compound resveratrol, which may help protect against Alzheimer's disease.  Foods that are DISCOURAGED on the MIND Diet: Butter and margarine: Try to eat less than 1 tablespoon (about 14 grams) daily. Instead, try using olive oil as your primary cooking fat, and dipping your bread in olive oil with herbs.  Cheese: The MIND diet recommends limiting your cheese consumption to less than once per week.  Red meat: Aim for no more than three servings each week. This includes all beef, pork, lamb and products made from these meats.  [place] food: The MIND diet highly discourages fried food, especially the kind from fast-food restaurants. Limit your consumption to less than once per week.  Pastries and sweets: This includes most of the processed junk food and desserts you can think of. Ice cream, cookies, brownies, snack cakes, donuts, candy and more. Try to limit these to no more than four times a week.  Exercise is one of the best medicines for promoting health and maintaining cognitive fitness at all stages in life. Exercise probably has the largest documented effect on  brain health and performance of any lifestyle intervention. Studies have shown that even previously sedentary individuals who start exercising as late as age 36 show a significant survival benefit as compared to their non-exercising peers. In the [place], the current guidelines are for 30 minutes of moderate exercise per day, but increasing your activity level less than that may also be helpful. You do not have to get your 30 minutes of exercise in one shot and exercising for short periods of time spread throughout the day can be helpful. Go for several walks, learn to dance, or do something else you enjoy that gets your body moving. Of course, if you have an underlying medical condition or there is any question about whether it is safe for you to exercise, you should consult a medical treatment provider prior to beginning exercise.    As with all capable adults, I would recommend that you consult with an elder care attorney regarding advanced directives if you have not, such as a healthcare power of attorney and living will. Consultation with an elder care attorney can help you protect your estate and communicate your preferences to your loved ones formally, in the event you are not able to do so yourself. I can provide my strong recommendation for the [employer], who are located at [address] in [place], [place]. They can be reached at [phone]. They also have a website [email or web address].

## 2020-03-15 NOTE — Progress Notes (Signed)
[facility]  Patient Name: Willie Lynch MRN: [id number] Date of Birth: Apr 16, 1934 Age: 86 y.o. Education: 62 years  Clinical Impressions  Willie Lynch is a 86 y.o., right-hand dominant, widowed man with a history of essential hypertension, CAD, HLD, and memory and thinking problems noticed more by the patients son than by him. He is a patient of Dr. Betsy Coder and was referred for evaluation in the service of definitive diagnosis, treatment planning, and education. He is still fairly independent with most day-to-day activities although his son isn't checking the finances or other things and his difficulties have been noticed by his friends. He required help from his family to initiate Aricept, because he got confused filling the prescriptions himself. He has no neuroimaging on file for review.   On neuropsychological testing, Willie Lynch demonstrated significant memory storage problems with diminished acquisition and retention of information on all measures, showing on verbally and visually mediated tasks. Apart from that, he performed well in other areas, with intact normal range performance on measures of language, processing speed, attention/working memory, visuospatial, and executive function. He screened negative for the presence of depression. His son characterized him as functioning from an MCI to very mild dementia level of function and his CDR is similar. I think that very mild dementia is likely the most accurate staging at the present time, considering all available information.   Willie Lynch is thus demonstrating a very mild dementia level cognitive problem with prominent memory loss. Given his demographics, clinical features, and test findings, Alzheimer's presents as the most likely etiology. There may be a component of vascular disease given his age and the presence of risk factors but he has no imaging and there are no specific features of his  test data to suggest that is a major player. Willie Lynch remains relatively independent but can likely benefit from further oversight of his affairs to make sure that nothing is being missed, as his friends have started to worry and his son worries also. I have no concerns about his driving ability at this time but it should be carefully monitored. He is already on Aricept and I will counsel him and his son on other lifestyle changes, such as MIND diet and exercise, that may be helpful.  Diagnostic Impressions: Alzheimer's dementia, late onset, without behavioral disturbance  Recommendations to be discussed with patient  Your performance and presentation on testing was consistent with a meaningful profile of strengths and weaknesses. Specifically, you had a very hard time with memory testing and appear to have what is called a "memory storage problem," which typically manifests with difficulties acquiring and retaining information across time. You did well in all other areas. This is good, and suggests that at this point, your cognitive difficulties are quite focal and you have a lot of remaining strengths. Nevertheless, I do think that the extent of the problem does reach a dementia level, while also acknowledging that the distinction between mild cognitive impairment and dementia in your case is somewhat arbitrary. I think that very mild dementia is likely the best term for your current level of problem.   Dementia refers to a group of syndromes where multiple areas of ability are damaged in the brain, such as memory, thinking, judgment, and behavior, and most commonly refers to age related causes of dementia that cause worsening in these abilities over time. Alzheimer's disease is the most common form of dementia in people over the age of 65. Not all dementias  are Alzheimer's disease, but all Alzheimer's disease is dementia. When dementia is due to an underlying condition affecting the brain, such as  Alzheimer's disease, there is progression over time, which typically procedes gradually over many years.   In your case, I think that Alzheimer's disease is the likely cause of your problem. This is given your demographics, test findings, and clinical history. Dr. Idelle Jo has already done a reversible lab workup for dementia and is working on your B12 with you, which could be contributing but is unlikely the sole cause of your problem. An MRI of the brain could also be considered at Dr. Betsy Coder discretion as part of a full and complete workup and to make sure that nothing is being missed. This would allow [place] to look for areas of vascular disease and also areas of shrinkage, which can sometimes occur with Alzheimer's.   I am concerned given your cognitive test data that you have reached a point where some increased monitoring of your affairs might be beneficial. This could include with complex things, such as medication adherence, and also finances. I would like to be clear that I did not specifically evaluate your decision making capacity as part of the present evaluation and this is not a formal statement of incapacity, because it is quite possible that you do not need help with those things. It would simply be prudent to allow your family to check to make sure that nothing is being missed and that you are taking all medications as prescribed.   I would recommend that you consult with an elder care attorney regarding advanced directives if you have not, such as a healthcare power of attorney and living will. Consultation with an elder care attorney can help you protect your estate and communicate your preferences to your loved ones formally, in the event you are not able to do so yourself. I can provide my strong recommendation for the [employer], who are located at [address] in [place], [place]. They can be reached at [phone]. They also have a website [email or web address].    You are already on a good front line medication for Alzheimer's, namely Aricept. Combination therapy with Aricept and Namenda is typically reserved for cases in the more moderate to late mild level of progression and/or for specific reasons in select cases.  At this point, I do not have specific concerns about your ability to safely operate a motor vehicle, as most individuals at a very mild dementia level of progression are able to drive safely.   There is now good quality evidence from at least one large scale study that a modified mediterranean diet may help slow cognitive decline. This is known as the "MIND" diet. The Mind diet is not so much a specific diet as it is a set of recommendations for things that you should and should not eat.   Foods that are ENCOURAGED on the MIND Diet:  Green, leafy vegetables: Aim for six or more servings per week. This includes kale, spinach, cooked greens and salads.  All other vegetables: Try to eat another vegetable in addition to the green leafy vegetables at least once a day. It is best to choose non-starchy vegetables because they have a lot of nutrients with a low number of calories.  Berries: Eat berries at least twice a week. There is a plethora of research on strawberries, and other berries such as blueberries, raspberries and blackberries have also been found to have antioxidant  and brain health benefits.  Nuts: Try to get five servings of nuts or more each week. The creators of the [place] don't specify what kind of nuts to consume, but it is probably best to vary the type of nuts you eat to obtain a variety of nutrients. Peanuts are a legume and do not fall into this category.  Olive oil: Use olive oil as your main cooking oil. There may be other heart-healthy alternatives such as algae oil, though there is not yet sufficient research upon which to base a formal recommendation.  Whole grains: Aim for at least three servings daily. Choose minimally  processed grains like oatmeal, quinoa, brown rice, whole-wheat pasta and 100% whole-wheat bread.  Fish: Eat fish at least once a week. It is best to choose fatty fish like salmon, sardines, trout, tuna and mackerel for their high amounts of omega-3 fatty acids.  Beans: Include beans in at least four meals every week. This includes all beans, lentils and soybeans.  Poultry: Try to eat chicken or [place] at least twice a week. Note that fried chicken is not encouraged on the MIND diet.  Wine: Aim for no more than one glass of alcohol daily. Both red and white wine may benefit the brain. However, much research has focused on the red wine compound resveratrol, which may help protect against Alzheimer's disease.  Foods that are DISCOURAGED on the MIND Diet: Butter and margarine: Try to eat less than 1 tablespoon (about 14 grams) daily. Instead, try using olive oil as your primary cooking fat, and dipping your bread in olive oil with herbs.  Cheese: The MIND diet recommends limiting your cheese consumption to less than once per week.  Red meat: Aim for no more than three servings each week. This includes all beef, pork, lamb and products made from these meats.  [place] food: The MIND diet highly discourages fried food, especially the kind from fast-food restaurants. Limit your consumption to less than once per week.  Pastries and sweets: This includes most of the processed junk food and desserts you can think of. Ice cream, cookies, brownies, snack cakes, donuts, candy and more. Try to limit these to no more than four times a week.  Exercise is one of the best medicines for promoting health and maintaining cognitive fitness at all stages in life. Exercise probably has the largest documented effect on brain health and performance of any lifestyle intervention. Studies have shown that even previously sedentary individuals who start exercising as late as age 14 show a significant survival benefit as compared to  their non-exercising peers. In the [place], the current guidelines are for 30 minutes of moderate exercise per day, but increasing your activity level less than that may also be helpful. You do not have to get your 30 minutes of exercise in one shot and exercising for short periods of time spread throughout the day can be helpful. Go for several walks, learn to dance, or do something else you enjoy that gets your body moving. Of course, if you have an underlying medical condition or there is any question about whether it is safe for you to exercise, you should consult a medical treatment provider prior to beginning exercise.   Test Findings  Test scores are summarized in additional documentation associated with this encounter. Test scores are relative to age, gender, and educational history as available and appropriate. There were no concerns about performance validity as all findings fell within normal expectations.   General  Intellectual Functioning/Achievement:  Performance on single word reading fell within the average range, which presents as a reasonable standard of comparison for Mr. Zhen cognitive test performance.   Attention and Processing Efficiency: Indicators of attention and working memory reflected normal performance with average scores on digit repetition forward and digit repetition backward.   Timed indicators of processing efficiency were normal, with average timed number-symbol coding and simple numeric sequencing.   Language: Performance on language measures was within normal limits with errorless visual object confrontation naming, low average phonemic fluency, and average semantic fluency.   Visuospatial Function: Performance on visuospatial and constructional indicators was very good and fell at a superior level on the overall index. Copy of a modestly complex line drawing was errorless and judgment of angular line orientations was average to high average.    Learning and Memory: Performance on indicators of learning and memory showed difficulties with acquisition and retention of information across time, on both verbal and visual indicators. The profile is consistent with an advanced memory storage problem.   In the verbal realm, Mr. Ratz immediate recall for material including a 12-item word list, brief short story, and daily living type information was low, in the extremely to unusually low range. Following a standard delay, he did not recall any of the words from the list or the short story information although he did recall just a few of the daily-living items. Nevertheless, his score was still unusually low. He did better on recognition of the daily-living items and word list with average discriminability in both cases, suggesting a bit of residual capacity.   In the visual realm, delayed recall for a modestly complex geometric figure was extremely low (did not recall any details).  Executive Functions: Performance was within normal limits on all measures within this domain with a score toward the upper portion of the average range on the [place] Composite of the [place] of words in response to given letters was low average as was reasoning with verbal information on the Complex Ideational Material. Clock drawing was within normal limits with only one point off for no size difference between the hands. His alternating sequencing of numbers and letters of the alphabet was very good, falling at a superior level.   Rating Scale(s): Mr. Stonebraker screened negative for the presence of depression. As per technician, he did become significantly agitated and a bit irate in response to the testing, which may suggest some lability, but he was pleasant and presented as quite euthymic during our interactions. His son characterized him as functioning from an MCI to a very mild dementia level and his CDR is  similar. I think, given all available evidence, that very mild dementia is the best severity classification for him at this time.   Viviano Simas Nicole Kindred PsyD, [place] Clinical Neuropsychologist

## 2020-04-05 ENCOUNTER — Encounter: Payer: Self-pay | Admitting: Family Medicine

## 2020-04-05 ENCOUNTER — Ambulatory Visit: Payer: Medicare Other | Admitting: Family Medicine

## 2020-04-05 ENCOUNTER — Other Ambulatory Visit: Payer: Self-pay

## 2020-04-05 VITALS — BP 120/82 | HR 74 | Temp 97.8°F | Ht 68.5 in | Wt 188.0 lb

## 2020-04-05 DIAGNOSIS — I10 Essential (primary) hypertension: Secondary | ICD-10-CM | POA: Diagnosis not present

## 2020-04-05 DIAGNOSIS — C44329 Squamous cell carcinoma of skin of other parts of face: Secondary | ICD-10-CM

## 2020-04-05 DIAGNOSIS — R413 Other amnesia: Secondary | ICD-10-CM | POA: Diagnosis not present

## 2020-04-05 DIAGNOSIS — E538 Deficiency of other specified B group vitamins: Secondary | ICD-10-CM | POA: Diagnosis not present

## 2020-04-05 DIAGNOSIS — E7800 Pure hypercholesterolemia, unspecified: Secondary | ICD-10-CM | POA: Diagnosis not present

## 2020-04-05 MED ORDER — SHINGRIX 50 MCG/0.5ML IM SUSR
0.5000 mL | Freq: Once | INTRAMUSCULAR | 0 refills | Status: DC
Start: 2020-04-05 — End: 2020-04-05

## 2020-04-05 MED ORDER — SHINGRIX 50 MCG/0.5ML IM SUSR: 0.5000 mL | Freq: Once | INTRAMUSCULAR | 0 refills | Status: AC

## 2020-04-05 NOTE — Patient Instructions (Addendum)
I would suggest COVID booster in 2 weeks time ([email or web address]) with shingrix vaccine and then 2nd shingrix in 2-6 months.   Call dermatology to set up visit due to biopsy showing squamous cell carcinoma. [facility]--[phone]

## 2020-04-05 NOTE — Addendum Note (Signed)
Addended by: Agnes Lawrence on: 04/05/2020 05:25 PM   Modules accepted: Orders

## 2020-04-05 NOTE — Progress Notes (Signed)
DODGE ATOR DOB: [birth year] Encounter date: 04/05/2020  This is a 86 y.o. male who presents with Chief Complaint  Patient presents with  . Follow-up    History of present illness: Here with sons today; Merry Proud and [name]. They are going to work on eating suggestions from neurology.  They were both present for follow-up visit where they discussed  More issues with loneliness later in day especially. Doesn't feel like appetite is affected; goes out to eat a couple of times a week.  Does still enjoy self.  He does cook for himself at home.  He has a male friend who he frequently see is as well as a male friend that he meets with regularly.  Group of friends he sees a couple times a month.  After review of neuropsychiatric testing notes, it is evident the patient has some memory storage issues.  Early onset dementia, likely Alzheimer's, although LATE suggested by neuropsychiatry.  Of note, since last visit, patient was called regarding biopsy results from his face and did not recall discussion that lesion removed was skin cancer and that he was referred to dermatology.  Allergies  Allergen Reactions  . Amoxicillin-Pot Clavulanate     REACTION: pt developed HIVES on augmentin  . Codeine   . Morphine    Current Meds  Medication Sig  . alfuzosin (UROXATRAL) 10 MG 24 hr tablet Take 10 mg by mouth daily.    Marland Kitchen amLODipine (NORVASC) 10 MG tablet TAKE 1 TABLET BY MOUTH EVERY DAY  . aspirin (ASPIR-LOW) 81 MG EC tablet Take 162 mg by mouth daily.   . clopidogrel (PLAVIX) 75 MG tablet TAKE 1 TABLET(75 MG) BY MOUTH DAILY  . diclofenac Sodium (VOLTAREN) 1 % GEL Apply 2 g topically 4 (four) times daily.  Marland Kitchen donepezil (ARICEPT) 10 MG tablet Take 1 tablet (10 mg total) by mouth at bedtime.  . famotidine (PEPCID) 20 MG tablet Take 1 tablet (20 mg total) by mouth daily as needed for heartburn or indigestion.  . finasteride (PROSCAR) 5 MG tablet Take 5 mg by mouth daily.    Marland Kitchen losartan (COZAAR) 50 MG  tablet TAKE 1 TABLET(50 MG) BY MOUTH TWICE DAILY  . Multiple Vitamins-Minerals (MULTIVITAL) tablet Take 1 tablet by mouth daily.    . nebivolol (BYSTOLIC) 5 MG tablet Take 0.5 tablets (2.5 mg total) by mouth daily.  . nitroGLYCERIN (NITROSTAT) 0.4 MG SL tablet DISSOLVE 1 TABLET UNDER THE TONGUE IF NEEDED AS DIRECTED.  . Omega-3 Fatty Acids (FISH OIL) 1000 MG CAPS Take 1 capsule by mouth daily.   . pramipexole (MIRAPEX) 0.5 MG tablet TAKE 2 TO 3 TABLETS BY MOUTH EVERY NIGHT 1 HOUR BEFORE BEDTIME  . rosuvastatin (CRESTOR) 40 MG tablet Take 1 tablet (40 mg total) by mouth daily.  . timolol (TIMOPTIC-XR) 0.5 % ophthalmic gel-forming Place 1 drop into both eyes every morning.  . TRAVATAN Z 0.004 % SOLN ophthalmic solution Place 1 drop into both eyes every evening.    Review of Systems  Constitutional: Negative for chills, fatigue and fever.  Respiratory: Negative for cough, chest tightness, shortness of breath and wheezing.   Cardiovascular: Negative for chest pain, palpitations and leg swelling.  Psychiatric/Behavioral: Negative for sleep disturbance. The patient is not nervous/anxious.     Objective:  BP 120/82 (BP Location: Left Arm, Patient Position: Sitting, Cuff Size: Normal)   Pulse 74   Temp 97.8 F (36.6 C) (Oral)   Ht 5' 8.5" (1.74 m)   Wt 188 lb (85.3 kg)  BMI 28.17 kg/m   Weight: 188 lb (85.3 kg)   BP Readings from Last 3 Encounters:  04/05/20 120/82  01/12/20 (!) 116/62  08/25/19 (!) 112/58   Wt Readings from Last 3 Encounters:  04/05/20 188 lb (85.3 kg)  01/12/20 192 lb 6.4 oz (87.3 kg)  08/25/19 192 lb 11.2 oz (87.4 kg)    Physical Exam Constitutional:      General: He is not in acute distress.    Appearance: He is well-developed.  Cardiovascular:     Rate and Rhythm: Normal rate and regular rhythm.     Heart sounds: Normal heart sounds. No murmur heard.  No friction rub.  Pulmonary:     Effort: Pulmonary effort is normal. No respiratory distress.      Breath sounds: Normal breath sounds. No wheezing or rales.  Musculoskeletal:     Right lower leg: No edema.     Left lower leg: No edema.  Neurological:     Mental Status: He is alert and oriented to person, place, and time.  Psychiatric:        Behavior: Behavior normal.     Assessment/Plan  1. Memory impairment We will start with imaging. Determine follow up pending this result.  We did discuss "loneliness" feeling.  Although he does not feel that mood is depressed, we discussed that mood and memory can be closely associated and that it is a good idea for [place] to continue to discuss his mood since we do have treatment options for this.  We discussed that we can do things like therapy, but that there is a role for medication if needed.  However, we discussed other behaviors that should help with mood including returning to his exercise facility for regular exercise.  He was doing this prior to the pandemic, but has fallen out of the routine of regular exercise.  Since exercise should help with mood as well as getting out of the house/interactions with others, I would be curious to see if this helps him overall.  We certainly could consider an SSRI if needed.  I would consider adding Namenda pending MRI results as well.  Consider neurology evaluation if warranted based on MRI results. - MR BRAIN WO CONTRAST; Future  2. HYPERCHOLESTEROLEMIA Cholesterol has been stable on Crestor 40 mg daily.  Continue this current dose.  3. Essential hypertension Blood pressure has been stable on Bystolic 2.5 mg, losartan 50 mg, amlodipine 10 mg.  Continue current medications.  He does follow with cardiology.  4.  Squamous cell carcinoma: Number given for dermatology to call for recently diagnosed squamous cell carcinoma removed from face at last visit.   Return in about 6 months (around 10/04/2020) for Chronic condition visit with bloodwork before. We may have him follow-up sooner pending MRI results.  He  will get flu shot here in the office today, we advised on shingles vaccination as well as third Covid shot recommendations. 30 minutes spent in time with room with patient and sons reviewing diagnosis, follow-up plan, medication and vaccination questions.  They are going to work with him at home to have healthy diet, regular exercise routine, and we will follow-up pending MRI imaging.       Micheline Rough, MD

## 2020-04-15 ENCOUNTER — Ambulatory Visit: Payer: Medicare Other | Admitting: Family Medicine

## 2020-04-19 ENCOUNTER — Other Ambulatory Visit: Payer: Self-pay | Admitting: Family Medicine

## 2020-04-26 ENCOUNTER — Ambulatory Visit
Admission: RE | Admit: 2020-04-26 | Discharge: 2020-04-26 | Disposition: A | Discharge status: Home / Self Care | Payer: Medicare Other | Source: Ambulatory Visit | Attending: Family Medicine | Admitting: Family Medicine

## 2020-04-26 ENCOUNTER — Other Ambulatory Visit: Payer: Self-pay

## 2020-04-26 DIAGNOSIS — R413 Other amnesia: Secondary | ICD-10-CM

## 2020-05-08 ENCOUNTER — Other Ambulatory Visit: Payer: Self-pay | Admitting: Family Medicine

## 2020-05-08 MED ORDER — ESCITALOPRAM OXALATE 5 MG PO TABS: 5.0000 mg | ORAL_TABLET | Freq: Every day | ORAL | 1 refills | Status: DC

## 2020-05-09 ENCOUNTER — Other Ambulatory Visit: Payer: Self-pay | Admitting: *Deleted

## 2020-05-09 MED ORDER — MEMANTINE HCL ER 7 MG PO CP24: 7.0000 mg | ORAL_CAPSULE | Freq: Every day | ORAL | 1 refills | Status: DC

## 2020-05-30 ENCOUNTER — Telehealth: Payer: Self-pay | Admitting: Family Medicine

## 2020-05-30 NOTE — Telephone Encounter (Signed)
Patient son is calling and is requesting a call back regarding patient taking Lexapro, please advise. CB is [phone]

## 2020-05-30 NOTE — Telephone Encounter (Signed)
Please see if you can get more information - concerns, questions, etc

## 2020-05-30 NOTE — Telephone Encounter (Signed)
error 

## 2020-05-31 NOTE — Telephone Encounter (Signed)
Yes it should be taken daily. He had expressed concerns with mood and starting treatment to help with this. We discussed that "lonely mood" may also affect mental clarity. This is not a medication he has to take if he does not want to, but if he is taking his other meds regularly (like namenda); I think it is worthwhile to try for a few months and see if helpful. If patient doesn't want to take it, that is fine as long as mood is doing ok. I think sons were more worried about mood than patient anyway. See MRI result note; this is where [name] had asked about medication. Happy to talk through with them if they desired, but decision of taking med up to them. Best to do visit if we talk and we could do phone/virtual.

## 2020-05-31 NOTE — Telephone Encounter (Signed)
Spoke with [name] and he stated is concerned due Lexapro being a medication for depression,  he thought this should be taken every day and does not feel his father will take this daily.  Questioned if this would be OK?  Message sent to PCP.

## 2020-05-31 NOTE — Telephone Encounter (Signed)
[name] informed of the message below.

## 2020-06-18 ENCOUNTER — Other Ambulatory Visit (HOSPITAL_COMMUNITY): Payer: Self-pay | Admitting: Cardiology

## 2020-07-10 ENCOUNTER — Ambulatory Visit: Payer: Medicare Other

## 2020-07-18 ENCOUNTER — Encounter: Payer: Self-pay | Admitting: Family Medicine

## 2020-07-22 ENCOUNTER — Other Ambulatory Visit: Payer: Self-pay | Admitting: Family Medicine

## 2020-07-30 NOTE — Progress Notes (Signed)
Subjective:   Willie Lynch is a 87 y.o. male who presents for an Initial Medicare Annual Wellness Visit.   I connected with Willie Lynch today by telephone and verified that I am speaking with the correct person using two identifiers. Location patient: home Location provider: work Persons participating in the virtual visit: patient, provider.   I discussed the limitations, risks, security and privacy concerns of performing an evaluation and management service by telephone and the availability of in person appointments. I also discussed with the patient that there may be a patient responsible charge related to this service. The patient expressed understanding and verbally consented to this telephonic visit.    Interactive audio and video telecommunications were attempted between this provider and patient, however failed, due to patient having technical difficulties OR patient did not have access to video capability.  We continued and completed visit with audio only.     Review of Systems    N/A  Cardiac Risk Factors include: advanced age (>66men, >23 women);male gender;dyslipidemia;hypertension     Objective:    Today's Vitals   There is no height or weight on file to calculate BMI.  Advanced Directives 07/31/2020 09/24/2015  Does Patient Have a Medical Advance Directive? No No  Would patient like information on creating a medical advance directive? No - Patient declined No - patient declined information    Current Medications (verified) Outpatient Encounter Medications as of 07/31/2020  Medication Sig  . alfuzosin (UROXATRAL) 10 MG 24 hr tablet Take 10 mg by mouth daily.  Marland Kitchen amLODipine (NORVASC) 10 MG tablet TAKE 1 TABLET BY MOUTH EVERY DAY  . clopidogrel (PLAVIX) 75 MG tablet TAKE 1 TABLET(75 MG) BY MOUTH DAILY  . donepezil (ARICEPT) 10 MG tablet TAKE 1 TABLET(10 MG) BY MOUTH AT BEDTIME  . finasteride (PROSCAR) 5 MG tablet Take 5 mg by mouth daily.  Marland Kitchen losartan (COZAAR) 50 MG  tablet TAKE 1 TABLET(50 MG) BY MOUTH TWICE DAILY  . memantine (NAMENDA XR) 7 MG CP24 24 hr capsule Take 1 capsule (7 mg total) by mouth daily.  . pramipexole (MIRAPEX) 0.5 MG tablet TAKE 2 TO 3 TABLETS BY MOUTH EVERY NIGHT 1 HOUR BEFORE BEDTIME  . pramipexole (MIRAPEX) 0.5 MG tablet TAKE 2 TO 3 TABLETS BY MOUTH EVERY NIGHT 1 HOUR BEFORE BEDTIME  . rosuvastatin (CRESTOR) 40 MG tablet Take 1 tablet (40 mg total) by mouth daily.  Marland Kitchen aspirin 81 MG EC tablet Take 162 mg by mouth daily.  (Patient not taking: Reported on 07/31/2020)  . diclofenac Sodium (VOLTAREN) 1 % GEL Apply 2 g topically 4 (four) times daily. (Patient not taking: Reported on 07/31/2020)  . escitalopram (LEXAPRO) 5 MG tablet Take 1 tablet (5 mg total) by mouth daily. (Patient not taking: Reported on 07/31/2020)  . famotidine (PEPCID) 20 MG tablet Take 1 tablet (20 mg total) by mouth daily as needed for heartburn or indigestion. (Patient not taking: Reported on 07/31/2020)  . Multiple Vitamins-Minerals (MULTIVITAL) tablet Take 1 tablet by mouth daily.   (Patient not taking: Reported on 07/31/2020)  . nebivolol (BYSTOLIC) 5 MG tablet Take 0.5 tablets (2.5 mg total) by mouth daily. (Patient not taking: Reported on 07/31/2020)  . nitroGLYCERIN (NITROSTAT) 0.4 MG SL tablet DISSOLVE 1 TABLET UNDER THE TONGUE IF NEEDED AS DIRECTED. (Patient not taking: Reported on 07/31/2020)  . Omega-3 Fatty Acids (FISH OIL) 1000 MG CAPS Take 1 capsule by mouth daily.  (Patient not taking: Reported on 07/31/2020)  . timolol (TIMOPTIC-XR) 0.5 %  ophthalmic gel-forming Place 1 drop into both eyes every morning. (Patient not taking: Reported on 07/31/2020)  . TRAVATAN Z 0.004 % SOLN ophthalmic solution Place 1 drop into both eyes every evening. (Patient not taking: Reported on 07/31/2020)   No facility-administered encounter medications on file as of 07/31/2020.    Allergies (verified) Amoxicillin-pot clavulanate, Codeine, and Morphine   History: Past Medical History:  Diagnosis  Date  . Abdominal pain, unspecified site   . Acquired cyst of kidney   . Allergic rhinitis, cause unspecified   . CAD (coronary artery disease)    NSTEMI 5/10. Rotational atherectomy/PCI w Xience DES x 3 to RCA and rotation atherectomy /PCI w Xience DES to prox LAD  . Diverticulosis of colon (without mention of hemorrhage)    last colon 11/05 by [facility] w divertics only  . Elevated prostate specific antigen (PSA)    followed by Dr Terance Hart for urology  . GERD (gastroesophageal reflux disease)    esophagitis  . Hyperlipidemia   . Hypertension    ACEI cough  . Irritable bowel syndrome   . Ischemic cardiomyopathy    mild echo (8/10) w EF 45-50%, diffuse hypokinesis, mild AI and mild MR  . Lumbago   . Migraine, unspecified, without mention of intractable migraine without mention of status migrainosus   . Osteoarthrosis, unspecified whether generalized or localized, unspecified site    Past Surgical History:  Procedure Laterality Date  . ANGIOPLASTY    . bilat inguinal hernia repairs  7/06   Dr Hassell Done  . CARDIAC CATHETERIZATION    . CORONARY ANGIOPLASTY     x4  . rotator cuff surgery     Dr. Shellia Carwin   Family History  Problem Relation Age of Onset  . Heart disease Father        smoker  . Heart attack Father   . Cancer Mother   . Cancer Other        ?? not sure what kind   Social History   Socioeconomic History  . Marital status: Widowed    Spouse name: Willie Lynch)  . Number of children: 2  . Years of education: Not on file  . Highest education level: Not on file  Occupational History  . Occupation: Surveyor, mining: RETIRED  . Occupation: previously worked for [employer]  . Smoking status: Former Smoker    Quit date: 06/22/1961    Years since quitting: 59.1  . Smokeless tobacco: Never Used  Substance and Sexual Activity  . Alcohol use: Yes    Comment: 4 drinks per week   . Drug use: No  . Sexual activity: Not on file  Other Topics  Concern  . Not on file  Social History Narrative   Quit smoking 1963. Exercises. Caffeine- 2 cups per day. Previously worked for [employer], now Optometrist.    Social Determinants of Health   Financial Resource Strain: Low Risk   . Difficulty of Paying Living Expenses: Not hard at all  Food Insecurity: No Food Insecurity  . Worried About Charity fundraiser in the Last Year: Never true  . Ran Out of Food in the Last Year: Never true  Transportation Needs: No Transportation Needs  . Lack of Transportation (Medical): No  . Lack of Transportation (Non-Medical): No  Physical Activity: Insufficiently Active  . Days of Exercise per Week: 3 days  . Minutes of Exercise per Session: 30 min  Stress: No Stress Concern Present  . Feeling  of Stress : Not at all  Social Connections: Moderately Isolated  . Frequency of Communication with Friends and Family: More than three times a week  . Frequency of Social Gatherings with Friends and Family: More than three times a week  . Attends Religious Services: More than 4 times per year  . Active Member of Clubs or Organizations: No  . Attends Archivist Meetings: Never  . Marital Status: Widowed    Tobacco Counseling Counseling given: Not Answered   Clinical Intake:  Pre-visit preparation completed: Yes  Pain : No/denies pain     Nutritional Risks: Nausea/ vomitting/ diarrhea (Nausea x1 within past week) Diabetes: No  How often do you need to have someone help you when you read instructions, pamphlets, or other written materials from your doctor or pharmacy?: 1 - Never  Diabetic? No   Interpreter Needed?: No  Information entered by :: [place] of Daily Living In your present state of health, do you have any difficulty performing the following activities: 07/31/2020  Hearing? N  Vision? N  Difficulty concentrating or making decisions? N  Walking or climbing stairs? N  Dressing or bathing? N  Doing  errands, shopping? N  Preparing Food and eating ? N  Using the Toilet? N  In the past six months, have you accidently leaked urine? N  Do you have problems with loss of bowel control? N  Managing your Medications? N  Managing your Finances? N  Housekeeping or managing your Housekeeping? N  Some recent data might be hidden    Patient Care Team: [name], MD as PCP - General (Family Medicine) Luberta Mutter, MD as Consulting Physician (Ophthalmology) Larey Dresser, MD as Consulting Physician (Cardiology) Festus Aloe, MD as Consulting Physician (Urology) Sydnee Cabal, MD as Consulting Physician (Orthopedic Surgery) Earnie Larsson, [facility] as Pharmacist (Pharmacist)  Indicate any recent Medical Services you may have received from other than [facility] providers in the past year (date may be approximate).     Assessment:   This is a routine wellness examination for [place].  Hearing/Vision screen  Hearing Screening   125Hz  250Hz  500Hz  1000Hz  2000Hz  3000Hz  4000Hz  6000Hz  8000Hz   Right ear:           Left ear:           Vision Screening Comments: Patient states gets eyes checked once per year. Wears reading glasses   Dietary issues and exercise activities discussed: Current Exercise Habits: Home exercise routine, Type of exercise: treadmill (bicycle), Time (Minutes): 30, Frequency (Times/Week): 3, Weekly Exercise (Minutes/Week): 90, Intensity: Mild  Goals    . Patient Stated     I will continue to go to the gym 3x per week    . Pharmacy Care Plan     CARE PLAN ENTRY (see longitudinal plan of care for additional care plan information)  Current Barriers:  . Chronic Disease Management support, education, and care coordination needs related to Hypertension, Hyperlipidemia, GERD, Osteoarthritis, BPH, and Memory deficit, RLS, coronary atherosclerosis   Hypertension BP Readings from Last 3 Encounters:  01/12/20 (!) 116/62  08/25/19 (!) 112/58  04/12/19 (!) 90/50    . Pharmacist Clinical Goal(s): o Over the next 90 days, patient will work with PharmD and providers to maintain BP goal <130/80 . Current regimen:   Amlodipine 10mg , 1 tablet once dialy  Losartan 50mg , 1 tablet twice daily  nebivolol (Bystolic) 5 mg, 0.5 tablets once daily . Patient self care activities - Over the next  90 days, patient will: o Check BP , document, and provide at future appointments o Ensure daily salt intake < 2300 mg/day  Hyperlipidemia Lab Results  Component Value Date/Time   LDLCALC 50 01/12/2020 09:27 AM   . Pharmacist Clinical Goal(s): o Over the next 90 days, patient will work with PharmD and providers to maintain LDL goal < 70 . Current regimen:  . Rosuvastatin 40mg , 1 tablet once daily  . Omega-3 (fish oil) 1000mg , 1 capsule once daily . Patient self care activities - Over the next 90 days, patient will: o Continue current medications as directed by provider.   Coronary atherosclerosis . Pharmacist Clinical Goal(s) o Over the next 90 days, patient will work with PharmD and providers to reduce reoccurrence of heart events . Current regimen:  . Clopidogrel 75mg , 1 tablet once daily  . Nitroglycerin 0.4mg , 1 tablet under tongue if needed as directed  . Patient self care activities - Over the next 90 days, patient will: o Continue current medications as directed by provider.   Memory deficit . Pharmacist Clinical Goal(s) o Over the next 90 days, patient will work with PharmD and providers to reduce worsening of memory.  . Current regimen:  o Donepezil 5mg , 1 tablet at bedtime  . Patient self care activities o Patient will continue current medications as directed by providers.  GERD . Pharmacist Clinical Goal(s) o Over the next 90 days, patient will work with PharmD and providers to reduce heartburn symptoms.  . Current regimen:  o Famotidine 20mg , 1 tablet daily as needed for heartburn or indigestion . Patient self care activities o Patient  will continue current medications as directed by provider.   Benign prostatic hyperplasia . Pharmacist Clinical Goal(s) o Over the next 90 days, patient will work with PharmD and providers to reduce symptoms of enlarged prostate.  . Current regimen:  . alfuzosin 10mg , 1 tablet once daily . Finasteride 5mg , 1 tablet once daily  . Patient self care activities o Patient will continue current medications as directed by provider   Restless legs syndrome (RLS)  . Pharmacist Clinical Goal(s) o Over the next 90 days, patient will work with PharmD and providers to reduce symptoms.  . Current regimen:  o Pramipexole 0.5mg , 2 to 3 tablets every night 1 hour before bedtime . Patient self care activities o Patient will continue current medications as directed by provider  Osteoarthritis . Pharmacist Clinical Goal(s) o Over the next 90 days, patient will work with PharmD and providers to minimize pain . Current regimen:  o Diclofenac 1% gel, apply 2 g four times daily . Patient self care activities o Patient will continue current medications as directed by provider   Medication management . Pharmacist Clinical Goal(s): o Over the next 90 days, patient will work with PharmD and providers to achieve optimal medication adherence . Current pharmacy: [facility] . Interventions o Comprehensive medication review performed. o Continue current medication management strategy o Reviewed [facility] pharmacy services for medication synchronization, packaging and delivery . Patient self care activities - Over the next 90 days, patient will: o Take medications as prescribed o Report any questions or concerns to PharmD and/or provider(s)  Initial goal documentation       Depression Screen PHQ 2/9 Scores 07/31/2020 04/12/2019  PHQ - 2 Score 0 0    Fall Risk Fall Risk  07/31/2020  Falls in the past year? 0  Number falls in past yr: 0  Injury with Fall? 0  Risk for fall due to :  No Fall Risks  Follow up  Falls evaluation completed;Falls prevention discussed    FALL RISK PREVENTION PERTAINING TO THE HOME:  Any stairs in or around the home? Yes  If so, are there any without handrails? No  Home free of loose throw rugs in walkways, pet beds, electrical cords, etc? Yes  Adequate lighting in your home to reduce risk of falls? Yes   ASSISTIVE DEVICES UTILIZED TO PREVENT FALLS:  Life alert? No  Use of a cane, walker or w/c? No  Grab bars in the bathroom? Yes  Shower chair or bench in shower? No  Elevated toilet seat or a handicapped toilet? No     Cognitive Function:  Cognitive status assessed by direct observation. Patient has current diagnosis of cognitive impairment. Jenetta Loges Cognitive Assessment  03/08/2020 01/12/2020 08/27/2019  Visuospatial/ Executive (0/5) 3 3 4   Naming (0/3) 3 2 2   Attention: Read list of digits (0/2) 2 2 2   Attention: Read list of letters (0/1) 1 1 1   Attention: Serial 7 subtraction starting at 100 (0/3) 3 3 3   Language: Repeat phrase (0/2) 1 2 2   Language : Fluency (0/1) 0 1 1  Abstraction (0/2) 2 2 2   Delayed Recall (0/5) 0 0 0  Orientation (0/6) 4 3 6   Total 19 19 23   Adjusted Score (based on education) 19 19 23       Immunizations Immunization History  Administered Date(s) Administered  . Fluad Quad(high Dose 65+) 04/05/2020  . Influenza Split 04/09/2011, 04/07/2013  . Influenza Whole 03/21/2008  . Influenza, High Dose Seasonal PF 04/09/2014, 04/20/2016, 03/31/2017, 04/21/2018, 04/05/2019  . Influenza-Unspecified 03/23/2015  . PFIZER(Purple Top)SARS-COV-2 Vaccination 07/14/2019, 08/04/2019, 04/26/2020  . Pneumococcal Polysaccharide-23 01/12/2020  . Tdap 01/13/2020    TDAP status: Due, Education has been provided regarding the importance of this vaccine. Advised may receive this vaccine at local pharmacy or Health Dept. Aware to provide a copy of the vaccination record if obtained from local pharmacy or Health Dept. Verbalized acceptance and  understanding.  Flu Vaccine status: Up to date  Pneumococcal vaccine status: Up to date  Covid-19 vaccine status: Completed vaccines  Qualifies for Shingles Vaccine? Yes   Zostavax completed No   Shingrix Completed?: No.    Education has been provided regarding the importance of this vaccine. Patient has been advised to call insurance company to determine out of pocket expense if they have not yet received this vaccine. Advised may also receive vaccine at local pharmacy or Health Dept. Verbalized acceptance and understanding.  Screening Tests Health Maintenance  Topic Date Due  . COVID-19 Vaccine (4 - Booster for Pfizer series) 10/24/2020  . PNA vac Low Risk Adult (2 of 2 - PCV13) 01/11/2021  . TETANUS/TDAP  01/12/2030  . INFLUENZA VACCINE  Completed    Health Maintenance  There are no preventive care reminders to display for this patient.  Colorectal cancer screening: No longer required.   Lung Cancer Screening: (Low Dose CT Chest recommended if Age 12-80 years, 30 pack-year currently smoking OR have quit w/in 15years.) does not qualify.   Lung Cancer Screening Referral: N/A   Additional Screening:  Hepatitis C Screening: does not qualify;   Vision Screening: Recommended annual ophthalmology exams for early detection of glaucoma and other disorders of the eye. Is the patient up to date with their annual eye exam?  Yes  Who is the provider or what is the name of the office in which the patient attends annual eye exams? Patient unsure  of eye doctor  If pt is not established with a provider, would they like to be referred to a provider to establish care? No .   Dental Screening: Recommended annual dental exams for proper oral hygiene  Community Resource Referral / Chronic Care Management: CRR required this visit?  No   CCM required this visit?  No      Plan:     I have personally reviewed and noted the following in the patient's chart:   . Medical and social  history . Use of alcohol, tobacco or illicit drugs  . Current medications and supplements . Functional ability and status . Nutritional status . Physical activity . Advanced directives . List of other physicians . Hospitalizations, surgeries, and ER visits in previous 12 months . Vitals . Screenings to include cognitive, depression, and falls . Referrals and appointments  In addition, I have reviewed and discussed with patient certain preventive protocols, quality metrics, and best practice recommendations. A written personalized care plan for preventive services as well as general preventive health recommendations were provided to patient.     Ofilia Neas, LPN   0/07/7251   Nurse Notes: None

## 2020-07-31 ENCOUNTER — Ambulatory Visit (INDEPENDENT_AMBULATORY_CARE_PROVIDER_SITE_OTHER): Payer: Medicare Other

## 2020-07-31 DIAGNOSIS — Z0000 Encounter for general adult medical examination without abnormal findings: Secondary | ICD-10-CM | POA: Diagnosis not present

## 2020-07-31 NOTE — Patient Instructions (Signed)
Willie Lynch , Thank you for taking time to come for your Medicare Wellness Visit. I appreciate your ongoing commitment to your health goals. Please review the following plan we discussed and let me know if I can assist you in the future.   Screening recommendations/referrals: Colonoscopy: No longer required  Recommended yearly ophthalmology/optometry visit for glaucoma screening and checkup Recommended yearly dental visit for hygiene and checkup  Vaccinations: Influenza vaccine: Up to date, next due fall 2022  Pneumococcal vaccine: Up to date, next due 01/11/2021 Tdap vaccine: Up to date, next due 01/12/2030 Shingles vaccine: Currently due for Shingrix, if you wish to receive we recommend that you do so at your local pharmacy as it is less expensive     Advanced directives: Advance directive discussed with you today. Even though you declined this today please call our office should you change your mind and we can give you the proper paperwork for you to fill out.   Conditions/risks identified: None   Next appointment: 10/02/2020 @ 10:00 am with Dr.Koberlein  Preventive Care 85 Years and Older, Male Preventive care refers to lifestyle choices and visits with your health care provider that can promote health and wellness. What does preventive care include?  A yearly physical exam. This is also called an annual well check.  Dental exams once or twice a year.  Routine eye exams. Ask your health care provider how often you should have your eyes checked.  Personal lifestyle choices, including:  Daily care of your teeth and gums.  Regular physical activity.  Eating a healthy diet.  Avoiding tobacco and drug use.  Limiting alcohol use.  Practicing safe sex.  Taking low doses of aspirin every day.  Taking vitamin and mineral supplements as recommended by your health care provider. What happens during an annual well check? The services and screenings done by your health care  provider during your annual well check will depend on your age, overall health, lifestyle risk factors, and family history of disease. Counseling  Your health care provider may ask you questions about your:  Alcohol use.  Tobacco use.  Drug use.  Emotional well-being.  Home and relationship well-being.  Sexual activity.  Eating habits.  History of falls.  Memory and ability to understand (cognition).  Work and work Statistician. Screening  You may have the following tests or measurements:  Height, weight, and BMI.  Blood pressure.  Lipid and cholesterol levels. These may be checked every 5 years, or more frequently if you are over 85 years old.  Skin check.  Lung cancer screening. You may have this screening every year starting at age 85 if you have a 30-pack-year history of smoking and currently smoke or have quit within the past 15 years.  Fecal occult blood test (FOBT) of the stool. You may have this test every year starting at age 85.  Flexible sigmoidoscopy or colonoscopy. You may have a sigmoidoscopy every 5 years or a colonoscopy every 10 years starting at age 85.  Prostate cancer screening. Recommendations will vary depending on your family history and other risks.  Hepatitis C blood test.  Hepatitis B blood test.  Sexually transmitted disease (STD) testing.  Diabetes screening. This is done by checking your blood sugar (glucose) after you have not eaten for a while (fasting). You may have this done every 1-3 years.  Abdominal aortic aneurysm (AAA) screening. You may need this if you are a current or former smoker.  Osteoporosis. You may be screened starting at  age 85 if you are at high risk. Talk with your health care provider about your test results, treatment options, and if necessary, the need for more tests. Vaccines  Your health care provider may recommend certain vaccines, such as:  Influenza vaccine. This is recommended every year.  Tetanus,  diphtheria, and acellular pertussis (Tdap, Td) vaccine. You may need a Td booster every 10 years.  Zoster vaccine. You may need this after age 85.  Pneumococcal 13-valent conjugate (PCV13) vaccine. One dose is recommended after age 85.  Pneumococcal polysaccharide (PPSV23) vaccine. One dose is recommended after age 85. Talk to your health care provider about which screenings and vaccines you need and how often you need them. This information is not intended to replace advice given to you by your health care provider. Make sure you discuss any questions you have with your health care provider. Document Released: 07/05/2015 Document Revised: 02/26/2016 Document Reviewed: 04/09/2015 Elsevier Interactive Patient Education  2017 [place] Prevention in the Home Falls can cause injuries. They can happen to people of all ages. There are many things you can do to make your home safe and to help prevent falls. What can I do on the outside of my home?  Regularly fix the edges of walkways and driveways and fix any cracks.  Remove anything that might make you trip as you walk through a door, such as a raised step or threshold.  Trim any bushes or trees on the path to your home.  Use bright outdoor lighting.  Clear any walking paths of anything that might make someone trip, such as rocks or tools.  Regularly check to see if handrails are loose or broken. Make sure that both sides of any steps have handrails.  Any raised decks and porches should have guardrails on the edges.  Have any leaves, snow, or ice cleared regularly.  Use sand or salt on walking paths during winter.  Clean up any spills in your garage right away. This includes oil or grease spills. What can I do in the bathroom?  Use night lights.  Install grab bars by the toilet and in the tub and shower. Do not use towel bars as grab bars.  Use non-skid mats or decals in the tub or shower.  If you need to sit down in  the shower, use a plastic, non-slip stool.  Keep the floor dry. Clean up any water that spills on the floor as soon as it happens.  Remove soap buildup in the tub or shower regularly.  Attach bath mats securely with double-sided non-slip rug tape.  Do not have throw rugs and other things on the floor that can make you trip. What can I do in the bedroom?  Use night lights.  Make sure that you have a light by your bed that is easy to reach.  Do not use any sheets or blankets that are too big for your bed. They should not hang down onto the floor.  Have a firm chair that has side arms. You can use this for support while you get dressed.  Do not have throw rugs and other things on the floor that can make you trip. What can I do in the kitchen?  Clean up any spills right away.  Avoid walking on wet floors.  Keep items that you use a lot in easy-to-reach places.  If you need to reach something above you, use a strong step stool that has a grab bar.  Keep electrical cords out of the way.  Do not use floor polish or wax that makes floors slippery. If you must use wax, use non-skid floor wax.  Do not have throw rugs and other things on the floor that can make you trip. What can I do with my stairs?  Do not leave any items on the stairs.  Make sure that there are handrails on both sides of the stairs and use them. Fix handrails that are broken or loose. Make sure that handrails are as long as the stairways.  Check any carpeting to make sure that it is firmly attached to the stairs. Fix any carpet that is loose or worn.  Avoid having throw rugs at the top or bottom of the stairs. If you do have throw rugs, attach them to the floor with carpet tape.  Make sure that you have a light switch at the top of the stairs and the bottom of the stairs. If you do not have them, ask someone to add them for you. What else can I do to help prevent falls?  Wear shoes that:  Do not have high  heels.  Have rubber bottoms.  Are comfortable and fit you well.  Are closed at the toe. Do not wear sandals.  If you use a stepladder:  Make sure that it is fully opened. Do not climb a closed stepladder.  Make sure that both sides of the stepladder are locked into place.  Ask someone to hold it for you, if possible.  Clearly mark and make sure that you can see:  Any grab bars or handrails.  First and last steps.  Where the edge of each step is.  Use tools that help you move around (mobility aids) if they are needed. These include:  Canes.  Walkers.  Scooters.  Crutches.  Turn on the lights when you go into a dark area. Replace any light bulbs as soon as they burn out.  Set up your furniture so you have a clear path. Avoid moving your furniture around.  If any of your floors are uneven, fix them.  If there are any pets around you, be aware of where they are.  Review your medicines with your doctor. Some medicines can make you feel dizzy. This can increase your chance of falling. Ask your doctor what other things that you can do to help prevent falls. This information is not intended to replace advice given to you by your health care provider. Make sure you discuss any questions you have with your health care provider. Document Released: 04/04/2009 Document Revised: 11/14/2015 Document Reviewed: 07/13/2014 Elsevier Interactive Patient Education  2017 [employer].

## 2020-08-01 ENCOUNTER — Telehealth: Payer: Self-pay | Admitting: Family Medicine

## 2020-08-01 NOTE — Telephone Encounter (Signed)
Left message for a return call. Please see note below from Dr. Ethlyn Gallery

## 2020-08-01 NOTE — Telephone Encounter (Signed)
Willie Macadam, MD  Ofilia Neas R, LPN His blood pressures have been pretty low recently it looks like. Could you ask him to check at home maybe a few times/week and report back in a couple of weeks? We can adjust his medications if running low at home. I don't want him getting light headed/dizzy/etc. Thanks!

## 2020-08-09 NOTE — Telephone Encounter (Signed)
Spoke with patient and informed him to check his blood pressures a few times per week and report them back to [place]. Patient verbalized understanding

## 2020-09-24 ENCOUNTER — Other Ambulatory Visit (INDEPENDENT_AMBULATORY_CARE_PROVIDER_SITE_OTHER): Payer: Medicare Other

## 2020-09-24 ENCOUNTER — Other Ambulatory Visit: Payer: Self-pay

## 2020-09-24 DIAGNOSIS — I10 Essential (primary) hypertension: Secondary | ICD-10-CM | POA: Diagnosis not present

## 2020-09-24 DIAGNOSIS — E7800 Pure hypercholesterolemia, unspecified: Secondary | ICD-10-CM

## 2020-09-24 DIAGNOSIS — E538 Deficiency of other specified B group vitamins: Secondary | ICD-10-CM

## 2020-09-24 LAB — LIPID PANEL
Cholesterol: 126 mg/dL (ref 0–200)
HDL: 40.4 mg/dL (ref >39.00)
LDL Cholesterol: 67 mg/dL (ref 0–99)
NonHDL: 86.03
Total CHOL/HDL Ratio: 3
Triglycerides: 97 mg/dL (ref 0.0–149.0)
VLDL: 19.4 mg/dL (ref 0.0–40.0)

## 2020-09-24 LAB — CBC WITH DIFFERENTIAL/PLATELET
Basophils Absolute: 0 10*3/uL (ref 0.0–0.1)
Basophils Relative: 0.7 % (ref 0.0–3.0)
Eosinophils Absolute: 0.1 10*3/uL (ref 0.0–0.7)
Eosinophils Relative: 1.8 % (ref 0.0–5.0)
HCT: 43.8 % (ref 39.0–52.0)
Hemoglobin: 15 g/dL (ref 13.0–17.0)
Lymphocytes Relative: 33.4 % (ref 12.0–46.0)
Lymphs Abs: 1.6 10*3/uL (ref 0.7–4.0)
MCHC: 34.3 g/dL (ref 30.0–36.0)
MCV: 90.9 fl (ref 78.0–100.0)
Monocytes Absolute: 0.5 10*3/uL (ref 0.1–1.0)
Monocytes Relative: 11 % (ref 3.0–12.0)
Neutro Abs: 2.5 10*3/uL (ref 1.4–7.7)
Neutrophils Relative %: 53.1 % (ref 43.0–77.0)
Platelets: 98 10*3/uL — ABNORMAL LOW (ref 150.0–400.0)
RBC: 4.82 Mil/uL (ref 4.22–5.81)
RDW: 14.2 % (ref 11.5–15.5)
WBC: 4.8 10*3/uL (ref 4.0–10.5)

## 2020-09-24 LAB — COMPREHENSIVE METABOLIC PANEL
ALT: 14 U/L (ref 0–53)
AST: 18 U/L (ref 0–37)
Albumin: 4.2 g/dL (ref 3.5–5.2)
Alkaline Phosphatase: 70 U/L (ref 39–117)
BUN: 12 mg/dL (ref 6–23)
CO2: 25 mEq/L (ref 19–32)
Calcium: 9.1 mg/dL (ref 8.4–10.5)
Chloride: 104 mEq/L (ref 96–112)
Creatinine, Ser: 0.7 mg/dL (ref 0.40–1.50)
GFR: 84.2 mL/min (ref >60.00)
Glucose, Bld: 87 mg/dL (ref 70–99)
Potassium: 3.6 mEq/L (ref 3.5–5.1)
Sodium: 140 mEq/L (ref 135–145)
Total Bilirubin: 2 mg/dL — ABNORMAL HIGH (ref 0.2–1.2)
Total Protein: 6.4 g/dL (ref 6.0–8.3)

## 2020-09-24 LAB — VITAMIN B12: Vitamin B-12: 225 pg/mL (ref 211–911)

## 2020-09-24 NOTE — Addendum Note (Signed)
Addended by: Elmer Picker on: 09/24/2020 08:14 AM   Modules accepted: Orders

## 2020-09-27 ENCOUNTER — Other Ambulatory Visit: Payer: Self-pay

## 2020-09-30 ENCOUNTER — Ambulatory Visit (INDEPENDENT_AMBULATORY_CARE_PROVIDER_SITE_OTHER): Payer: Medicare Other | Admitting: Family Medicine

## 2020-09-30 ENCOUNTER — Other Ambulatory Visit: Payer: Self-pay

## 2020-09-30 ENCOUNTER — Encounter: Payer: Self-pay | Admitting: Family Medicine

## 2020-09-30 VITALS — BP 140/72 | HR 71 | Temp 97.8°F | Ht 68.5 in | Wt 184.3 lb

## 2020-09-30 DIAGNOSIS — K219 Gastro-esophageal reflux disease without esophagitis: Secondary | ICD-10-CM

## 2020-09-30 DIAGNOSIS — R17 Unspecified jaundice: Secondary | ICD-10-CM | POA: Diagnosis not present

## 2020-09-30 DIAGNOSIS — I2510 Atherosclerotic heart disease of native coronary artery without angina pectoris: Secondary | ICD-10-CM | POA: Diagnosis not present

## 2020-09-30 DIAGNOSIS — E7800 Pure hypercholesterolemia, unspecified: Secondary | ICD-10-CM | POA: Diagnosis not present

## 2020-09-30 DIAGNOSIS — R413 Other amnesia: Secondary | ICD-10-CM

## 2020-09-30 DIAGNOSIS — I10 Essential (primary) hypertension: Secondary | ICD-10-CM

## 2020-09-30 DIAGNOSIS — G2581 Restless legs syndrome: Secondary | ICD-10-CM

## 2020-09-30 MED ORDER — ESCITALOPRAM OXALATE 5 MG PO TABS: 5.0000 mg | ORAL_TABLET | Freq: Every day | ORAL | 1 refills | Status: DC

## 2020-09-30 MED ORDER — PRAMIPEXOLE DIHYDROCHLORIDE 0.5 MG PO TABS: 1.0000 mg | ORAL_TABLET | Freq: Every evening | ORAL | 1 refills | Status: DC | PRN

## 2020-09-30 MED ORDER — AMLODIPINE BESYLATE 10 MG PO TABS: ORAL_TABLET | ORAL | 1 refills | Status: DC

## 2020-09-30 MED ORDER — NITROGLYCERIN 0.4 MG SL SUBL: SUBLINGUAL_TABLET | SUBLINGUAL | 3 refills | Status: AC

## 2020-09-30 MED ORDER — MEMANTINE HCL ER 7 MG PO CP24: 7.0000 mg | ORAL_CAPSULE | Freq: Every day | ORAL | 1 refills | Status: DC

## 2020-09-30 MED ORDER — CLOPIDOGREL BISULFATE 75 MG PO TABS: ORAL_TABLET | ORAL | 3 refills | Status: DC

## 2020-09-30 MED ORDER — SHINGRIX 50 MCG/0.5ML IM SUSR: 0.5000 mL | Freq: Once | INTRAMUSCULAR | 0 refills | Status: AC

## 2020-09-30 MED ORDER — NEBIVOLOL HCL 5 MG PO TABS: 2.5000 mg | ORAL_TABLET | Freq: Every day | ORAL | 1 refills | Status: DC

## 2020-09-30 MED ORDER — LOSARTAN POTASSIUM 50 MG PO TABS: ORAL_TABLET | ORAL | 1 refills | Status: DC

## 2020-09-30 NOTE — Progress Notes (Signed)
Willie Lynch DOB: 01/27/1934 Encounter date: 09/30/2020  This is a 87 y.o. male who presents with Chief Complaint  Patient presents with  . Follow-up    History of present illness: With reviewing bloodwork; son states that patient does occasionally complain of nausea - not regularly, but has mentioned it. No vomiting. Bowels have been normal without blood and without color change. Patient states that he does occasionally have heart burn.   Hasn't had any more growth from scc that we removed, but didn't follow up with ederm  GERD: pepcid just as needed.   HTN: bystolic 2.5mg , losartan 50mg , amlodipine 10mg . Not checking regularly at home. No headaches, chest pain, pressure.   HL: crestor 40mg  daily - no muscle aches/cramps  Memory impairment: namenda 7mg  daily, aricept. Son and patient not aware of whether he is taking namenda; per our system he would have run out.  Son states short term memory is getting worse. Note on recent [facility] cog score was same as previous.  Little arthritis in hands. Rare use of tylenol. voltaren gel didn't help.   Appetite slightly less but still eating 3 meals a day; going out less.   Allergies  Allergen Reactions  . Amoxicillin-Pot Clavulanate     REACTION: pt developed HIVES on augmentin  . Codeine   . Morphine    Current Meds  Medication Sig  . alfuzosin (UROXATRAL) 10 MG 24 hr tablet Take 10 mg by mouth daily.  Marland Kitchen aspirin 81 MG EC tablet Take 162 mg by mouth daily.  Marland Kitchen donepezil (ARICEPT) 10 MG tablet TAKE 1 TABLET(10 MG) BY MOUTH AT BEDTIME  . famotidine (PEPCID) 20 MG tablet Take 1 tablet (20 mg total) by mouth daily as needed for heartburn or indigestion.  . finasteride (PROSCAR) 5 MG tablet Take 5 mg by mouth daily.  . Multiple Vitamins-Minerals (MULTIVITAL) tablet Take 1 tablet by mouth daily.  . Omega-3 Fatty Acids (FISH OIL) 1000 MG CAPS Take 1 capsule by mouth daily.  . rosuvastatin (CRESTOR) 40 MG tablet Take 1 tablet (40 mg total) by  mouth daily.  . timolol (TIMOPTIC-XR) 0.5 % ophthalmic gel-forming Place 1 drop into both eyes every morning.  . TRAVATAN Z 0.004 % SOLN ophthalmic solution Place 1 drop into both eyes every evening.  Marland Kitchen Zoster Vaccine Adjuvanted [facility]) injection Inject 0.5 mLs into the muscle once for 1 dose. Repeat in 2-6 months  . [DISCONTINUED] amLODipine (NORVASC) 10 MG tablet TAKE 1 TABLET BY MOUTH EVERY DAY  . [DISCONTINUED] clopidogrel (PLAVIX) 75 MG tablet TAKE 1 TABLET(75 MG) BY MOUTH DAILY  . [DISCONTINUED] diclofenac Sodium (VOLTAREN) 1 % GEL Apply 2 g topically 4 (four) times daily.  . [DISCONTINUED] escitalopram (LEXAPRO) 5 MG tablet Take 1 tablet (5 mg total) by mouth daily.  . [DISCONTINUED] losartan (COZAAR) 50 MG tablet TAKE 1 TABLET(50 MG) BY MOUTH TWICE DAILY  . [DISCONTINUED] memantine (NAMENDA XR) 7 MG CP24 24 hr capsule Take 1 capsule (7 mg total) by mouth daily.  . [DISCONTINUED] nebivolol (BYSTOLIC) 5 MG tablet Take 0.5 tablets (2.5 mg total) by mouth daily.  . [DISCONTINUED] nitroGLYCERIN (NITROSTAT) 0.4 MG SL tablet DISSOLVE 1 TABLET UNDER THE TONGUE IF NEEDED AS DIRECTED.  . [DISCONTINUED] pramipexole (MIRAPEX) 0.5 MG tablet TAKE 2 TO 3 TABLETS BY MOUTH EVERY NIGHT 1 HOUR BEFORE BEDTIME  . [DISCONTINUED] pramipexole (MIRAPEX) 0.5 MG tablet TAKE 2 TO 3 TABLETS BY MOUTH EVERY NIGHT 1 HOUR BEFORE BEDTIME    Review of Systems  Constitutional: Negative  for chills, fatigue and fever.  Respiratory: Negative for cough, chest tightness, shortness of breath and wheezing.   Cardiovascular: Negative for chest pain, palpitations and leg swelling.    Objective:  BP 140/72 (BP Location: Left Arm, Patient Position: Sitting, Cuff Size: Large)   Pulse 71   Temp 97.8 F (36.6 C) (Oral)   Ht 5' 8.5" (1.74 m)   Wt 184 lb 4.8 oz (83.6 kg)   SpO2 95%   BMI 27.62 kg/m   Weight: 184 lb 4.8 oz (83.6 kg)   BP Readings from Last 3 Encounters:  09/30/20 140/72  04/05/20 120/82  01/12/20 (!)  116/62   Wt Readings from Last 3 Encounters:  09/30/20 184 lb 4.8 oz (83.6 kg)  04/05/20 188 lb (85.3 kg)  01/12/20 192 lb 6.4 oz (87.3 kg)    Physical Exam Constitutional:      General: He is not in acute distress.    Appearance: He is well-developed.  Cardiovascular:     Rate and Rhythm: Normal rate and regular rhythm.     Heart sounds: Murmur heard.   Systolic murmur is present with a grade of 2/6. No friction rub.  Pulmonary:     Effort: Pulmonary effort is normal. No respiratory distress.     Breath sounds: Rales (minimal at bases) present. No wheezing.  Musculoskeletal:     Right lower leg: No edema.     Left lower leg: No edema.  Neurological:     Mental Status: He is alert and oriented to person, place, and time.  Psychiatric:        Behavior: Behavior normal.    Depression screen [facility] 2/9 07/31/2020 04/12/2019  Decreased Interest 0 0  Down, Depressed, Hopeless 0 0  PHQ - 2 Score 0 0    Assessment/Plan  1. Elevated bilirubin Recheck in 1-2 weeks to ensure normalization.  - Comprehensive metabolic panel; Future - Gamma GT; Future  2. Essential hypertension Hasn't had medication this morning. Encouraged a little more regular checking at home.  - nebivolol (BYSTOLIC) 5 MG tablet; Take 0.5 tablets (2.5 mg total) by mouth daily.  Dispense: 45 tablet; Refill: 1  3. Atherosclerosis of native coronary artery of native heart without angina pectoris Continue to monitor blood pressure - nitroGLYCERIN (NITROSTAT) 0.4 MG SL tablet; DISSOLVE 1 TABLET UNDER THE TONGUE IF NEEDED AS DIRECTED.  Dispense: 25 tablet; Refill: 3  4. Gastroesophageal reflux disease without esophagitis Prn pepcid  5. HYPERCHOLESTEROLEMIA Continue with crestor 40mg  daily.   6. RLS (restless legs syndrome) Does well with the mirapex 1mg  qhs.   7. Memory impairment: we will refer to neurology so that they can make sure all that is possible for memory is being done. Encouraged regular activity, I  have refilled both aricept and namenda today.   Return in about 2 weeks (around 10/14/2020) for 2 weeks bloodwork; CCV in 6 months please.    Micheline Rough, MD

## 2020-09-30 NOTE — Patient Instructions (Addendum)
Call dermatology to set up visit due to biopsy showing squamous cell carcinoma. Allyson Sabal derm--[phone]  You can check on coverage for the shingrix; I have sent this to the pharmacy.   Ok to get COVID booster in May - can go to [email or web address] or to pharmacy to schedule

## 2020-10-02 ENCOUNTER — Ambulatory Visit: Payer: Medicare Other | Admitting: Family Medicine

## 2020-10-04 ENCOUNTER — Ambulatory Visit: Payer: Medicare Other | Admitting: Family Medicine

## 2020-10-09 ENCOUNTER — Encounter: Payer: Self-pay | Admitting: Neurology

## 2020-10-14 ENCOUNTER — Other Ambulatory Visit: Payer: Self-pay

## 2020-10-14 ENCOUNTER — Other Ambulatory Visit (INDEPENDENT_AMBULATORY_CARE_PROVIDER_SITE_OTHER): Payer: Medicare Other

## 2020-10-14 DIAGNOSIS — R17 Unspecified jaundice: Secondary | ICD-10-CM

## 2020-10-14 LAB — COMPREHENSIVE METABOLIC PANEL
ALT: 11 U/L (ref 0–53)
AST: 15 U/L (ref 0–37)
Albumin: 4 g/dL (ref 3.5–5.2)
Alkaline Phosphatase: 70 U/L (ref 39–117)
BUN: 13 mg/dL (ref 6–23)
CO2: 23 mEq/L (ref 19–32)
Calcium: 9 mg/dL (ref 8.4–10.5)
Chloride: 105 mEq/L (ref 96–112)
Creatinine, Ser: 0.77 mg/dL (ref 0.40–1.50)
GFR: 81.78 mL/min (ref >60.00)
Glucose, Bld: 102 mg/dL — ABNORMAL HIGH (ref 70–99)
Potassium: 3.6 mEq/L (ref 3.5–5.1)
Sodium: 138 mEq/L (ref 135–145)
Total Bilirubin: 2.4 mg/dL — ABNORMAL HIGH (ref 0.2–1.2)
Total Protein: 6.3 g/dL (ref 6.0–8.3)

## 2020-10-14 LAB — GAMMA GT: GGT: 8 U/L (ref 7–51)

## 2020-10-15 ENCOUNTER — Other Ambulatory Visit: Payer: Self-pay | Admitting: Family Medicine

## 2020-10-15 DIAGNOSIS — R17 Unspecified jaundice: Secondary | ICD-10-CM

## 2020-11-11 ENCOUNTER — Other Ambulatory Visit: Payer: Medicare Other

## 2020-12-16 ENCOUNTER — Telehealth: Payer: Self-pay | Admitting: Pharmacist

## 2020-12-16 NOTE — Chronic Care Management (AMB) (Signed)
    Chronic Care Management Pharmacy Assistant   Name: Willie Lynch  MRN: [id number] DOB: 12-May-1934  Reason for Encounter:    Recent office visits:  04.11.2022 [name], MD patient seen for follow-up and review lab results 02.09.2022 Launa Grill, LPN Medicare Wellness Exam  Recent consult visits:  None  Hospital visits:  None in previous 6 months  Medications: Outpatient Encounter Medications as of 12/16/2020  Medication Sig   alfuzosin (UROXATRAL) 10 MG 24 hr tablet Take 10 mg by mouth daily.   amLODipine (NORVASC) 10 MG tablet TAKE 1 TABLET BY MOUTH EVERY DAY   aspirin 81 MG EC tablet Take 162 mg by mouth daily.   clopidogrel (PLAVIX) 75 MG tablet TAKE 1 TABLET(75 MG) BY MOUTH DAILY   donepezil (ARICEPT) 10 MG tablet TAKE 1 TABLET(10 MG) BY MOUTH AT BEDTIME   escitalopram (LEXAPRO) 5 MG tablet Take 1 tablet (5 mg total) by mouth daily.   famotidine (PEPCID) 20 MG tablet Take 1 tablet (20 mg total) by mouth daily as needed for heartburn or indigestion.   finasteride (PROSCAR) 5 MG tablet Take 5 mg by mouth daily.   losartan (COZAAR) 50 MG tablet Take 1 tablet PO BID   memantine (NAMENDA XR) 7 MG CP24 24 hr capsule Take 1 capsule (7 mg total) by mouth daily.   Multiple Vitamins-Minerals (MULTIVITAL) tablet Take 1 tablet by mouth daily.   nebivolol (BYSTOLIC) 5 MG tablet Take 0.5 tablets (2.5 mg total) by mouth daily.   nitroGLYCERIN (NITROSTAT) 0.4 MG SL tablet DISSOLVE 1 TABLET UNDER THE TONGUE IF NEEDED AS DIRECTED.   Omega-3 Fatty Acids (FISH OIL) 1000 MG CAPS Take 1 capsule by mouth daily.   pramipexole (MIRAPEX) 0.5 MG tablet Take 2 tablets (1 mg total) by mouth at bedtime as needed.   rosuvastatin (CRESTOR) 40 MG tablet Take 1 tablet (40 mg total) by mouth daily.   timolol (TIMOPTIC-XR) 0.5 % ophthalmic gel-forming Place 1 drop into both eyes every morning.   TRAVATAN Z 0.004 % SOLN ophthalmic solution Place 1 drop into both eyes every evening.   No  facility-administered encounter medications on file as of 12/16/2020.   I called and spoke with the patient about medication adherence. He stated that he has been doing well. He is not experiencing any side effects from his current medications. There have been no recent falls or injuries. There have been no recent changes in his medications. He states that it has been no change to his diet or his exercise routine. He continues to take his medication as prescribed. There had been no hospital, emergency department, or urgent care visits since his last PCP or CPP visit. He does not have any issues with his pharmacy. He did not want to make a follow-up CCM appointment today.  Star Rating Drugs: Medication Dispensed  Quantity Pharmacy  Losartan 50 mg 04.11.2022 [facility]   Amilia ([place]) Mare Ferrari, [place] Pharmacist Assistant [phone]

## 2020-12-31 ENCOUNTER — Ambulatory Visit: Payer: Medicare Other | Admitting: Neurology

## 2021-02-19 ENCOUNTER — Ambulatory Visit: Payer: Medicare Other | Admitting: Neurology

## 2021-03-07 ENCOUNTER — Other Ambulatory Visit: Payer: Self-pay

## 2021-03-07 ENCOUNTER — Ambulatory Visit: Payer: Medicare Other | Admitting: Physician Assistant

## 2021-03-07 ENCOUNTER — Encounter: Payer: Self-pay | Admitting: Neurology

## 2021-03-07 ENCOUNTER — Ambulatory Visit: Payer: Medicare Other | Admitting: Neurology

## 2021-03-07 VITALS — BP 144/82 | HR 71 | Ht 71.0 in | Wt 182.6 lb

## 2021-03-07 DIAGNOSIS — G301 Alzheimer's disease with late onset: Secondary | ICD-10-CM | POA: Diagnosis not present

## 2021-03-07 DIAGNOSIS — F0280 Dementia in other diseases classified elsewhere without behavioral disturbance: Secondary | ICD-10-CM

## 2021-03-07 NOTE — Patient Instructions (Addendum)
Good to meet you.  Continue Aricept (Donepezil) '10mg'$ : take 1 tablet daily  2. Recommend starting pillpacks with the pharmacy  3. Consider hearing evaluation  FALL PRECAUTIONS: Be cautious when walking. Scan the area for obstacles that may increase the risk of trips and falls. When getting up in the mornings, sit up at the edge of the bed for a few minutes before getting out of bed. Consider elevating the bed at the head end to avoid drop of blood pressure when getting up. Walk always in a well-lit room (use night lights in the walls). Avoid area rugs or power cords from appliances in the middle of the walkways. Use a walker or a cane if necessary and consider physical therapy for balance exercise. Get your eyesight checked regularly.  FINANCIAL OVERSIGHT: Supervision, especially oversight when making financial decisions or transactions is also recommended.  HOME SAFETY: Consider the safety of the kitchen when operating appliances like stoves, microwave oven, and blender. Consider having supervision and share cooking responsibilities until no longer able to participate in those. Accidents with firearms and other hazards in the house should be identified and addressed as well.  DRIVING: Regarding driving, in patients with progressive memory problems, driving will be impaired. We advise to have someone else do the driving if trouble finding directions or if minor accidents are reported. Independent driving assessment is available to determine safety of driving.  ABILITY TO BE LEFT ALONE: If patient is unable to contact 911 operator, consider using LifeLine, or when the need is there, arrange for someone to stay with patients. Smoking is a fire hazard, consider supervision or cessation. Risk of wandering should be assessed by caregiver and if detected at any point, supervision and safe proof recommendations should be instituted.  MEDICATION SUPERVISION: Inability to self-administer medication needs to  be constantly addressed. Implement a mechanism to ensure safe administration of the medications.  RECOMMENDATIONS FOR ALL PATIENTS WITH MEMORY PROBLEMS: 1. Continue to exercise (Recommend 30 minutes of walking everyday, or 3 hours every week) 2. Increase social interactions - continue going to [place] and enjoy social gatherings with friends and family 3. Eat healthy, avoid fried foods and eat more fruits and vegetables 4. Maintain adequate blood pressure, blood sugar, and blood cholesterol level. Reducing the risk of stroke and cardiovascular disease also helps promoting better memory. 5. Avoid stressful situations. Live a simple life and avoid aggravations. Organize your time and prepare for the next day in anticipation. 6. Sleep well, avoid any interruptions of sleep and avoid any distractions in the bedroom that may interfere with adequate sleep quality 7. Avoid sugar, avoid sweets as there is a strong link between excessive sugar intake, diabetes, and cognitive impairment We discussed the Mediterranean diet, which has been shown to help patients reduce the risk of progressive memory disorders and reduces cardiovascular risk. This includes eating fish, eat fruits and green leafy vegetables, nuts like almonds and hazelnuts, walnuts, and also use olive oil. Avoid fast foods and fried foods as much as possible. Avoid sweets and sugar as sugar use has been linked to worsening of memory function.  There is always a concern of gradual progression of memory problems. If this is the case, then we may need to adjust level of care according to patient needs. Support, both to the patient and caregiver, should then be put into place.      Mediterranean Diet  Why follow it? Research shows. Those who follow the Mediterranean diet have a reduced risk of heart  disease  The diet is associated with a reduced incidence of Parkinson's and Alzheimer's diseases People following the diet may have longer life  expectancies and lower rates of chronic diseases  The Dietary Guidelines for Americans recommends the Mediterranean diet as an eating plan to promote health and prevent disease  What Is the Mediterranean Diet?  Healthy eating plan based on typical foods and recipes of Mediterranean-style cooking The diet is primarily a plant based diet; these foods should make up a majority of meals   Starches - Plant based foods should make up a majority of meals - They are an important sources of vitamins, minerals, energy, antioxidants, and fiber - Choose whole grains, foods high in fiber and minimally processed items  - Typical grain sources include wheat, oats, barley, corn, brown rice, bulgar, farro, millet, polenta, couscous  - Various types of beans include chickpeas, lentils, fava beans, black beans, white beans   Fruits  Veggies - Large quantities of antioxidant rich fruits & veggies; 6 or more servings  - Vegetables can be eaten raw or lightly drizzled with oil and cooked  - Vegetables common to the traditional Mediterranean Diet include: artichokes, arugula, beets, broccoli, brussel sprouts, cabbage, carrots, celery, collard greens, cucumbers, eggplant, kale, leeks, lemons, lettuce, mushrooms, okra, onions, peas, peppers, potatoes, pumpkin, radishes, rutabaga, shallots, spinach, sweet potatoes, turnips, zucchini - Fruits common to the Mediterranean Diet include: apples, apricots, avocados, cherries, clementines, dates, figs, grapefruits, grapes, melons, nectarines, oranges, peaches, pears, pomegranates, strawberries, tangerines  Fats - Replace butter and margarine with healthy oils, such as olive oil, canola oil, and tahini  - Limit nuts to no more than a handful a day  - Nuts include walnuts, almonds, pecans, pistachios, pine nuts  - Limit or avoid candied, honey roasted or heavily salted nuts - Olives are central to the [employer] - can be eaten whole or used in a variety of dishes   Meats  Protein - Limiting red meat: no more than a few times a month - When eating red meat: choose lean cuts and keep the portion to the size of deck of cards - Eggs: approx. 0 to 4 times a week  - Fish and lean poultry: at least 2 a week  - Healthy protein sources include, chicken, [place], lean beef, lamb - Increase intake of seafood such as tuna, salmon, trout, mackerel, shrimp, scallops - Avoid or limit high fat processed meats such as sausage and bacon  Dairy - Include moderate amounts of low fat dairy products  - Focus on healthy dairy such as fat free yogurt, skim milk, low or reduced fat cheese - Limit dairy products higher in fat such as whole or 2% milk, cheese, ice cream  Alcohol - Moderate amounts of red wine is ok  - No more than 5 oz daily for women (all ages) and men older than age 95  - No more than 10 oz of wine daily for men younger than 46  Other - Limit sweets and other desserts  - Use herbs and spices instead of salt to flavor foods  - Herbs and spices common to the traditional Mediterranean Diet include: basil, bay leaves, chives, cloves, cumin, fennel, garlic, lavender, marjoram, mint, oregano, parsley, pepper, rosemary, sage, savory, sumac, tarragon, thyme   It's not just a diet, it's a lifestyle:  The Mediterranean diet includes lifestyle factors typical of those in the region  Foods, drinks and meals are best eaten with others and savored Daily physical activity  is important for overall good health This could be strenuous exercise like running and aerobics This could also be more leisurely activities such as walking, housework, yard-work, or taking the stairs Moderation is the key; a balanced and healthy diet accommodates most foods and drinks Consider portion sizes and frequency of consumption of certain foods   Meal Ideas & Options:  Breakfast:  Whole wheat toast or whole wheat English muffins with peanut butter & hard boiled egg Steel cut oats topped with apples &  cinnamon and skim milk  Fresh fruit: banana, strawberries, melon, berries, peaches  Smoothies: strawberries, bananas, greek yogurt, peanut butter Low fat greek yogurt with blueberries and granola  Egg white omelet with spinach and mushrooms Breakfast couscous: whole wheat couscous, apricots, skim milk, cranberries  Sandwiches:  Hummus and grilled vegetables (peppers, zucchini, squash) on whole wheat bread   Grilled chicken on whole wheat pita with lettuce, tomatoes, cucumbers or tzatziki  Jordan salad on whole wheat bread: tuna salad made with greek yogurt, olives, red peppers, capers, green onions Garlic rosemary lamb pita: lamb sauted with garlic, rosemary, salt & pepper; add lettuce, cucumber, greek yogurt to pita - flavor with lemon juice and black pepper  Seafood:  Mediterranean grilled salmon, seasoned with garlic, basil, parsley, lemon juice and black pepper Shrimp, lemon, and spinach whole-grain pasta salad made with low fat greek yogurt  Seared scallops with lemon orzo  Seared tuna steaks seasoned salt, pepper, coriander topped with tomato mixture of olives, tomatoes, olive oil, minced garlic, parsley, green onions and cappers  Meats:  Herbed greek chicken salad with kalamata olives, cucumber, feta  Red bell peppers stuffed with spinach, bulgur, lean ground beef (or lentils) & topped with feta   Kebabs: skewers of chicken, tomatoes, onions, zucchini, squash  [place] burgers: made with red onions, mint, dill, lemon juice, feta cheese topped with roasted red peppers Vegetarian Cucumber salad: cucumbers, artichoke hearts, celery, red onion, feta cheese, tossed in olive oil & lemon juice  Hummus and whole grain pita points with a greek salad (lettuce, tomato, feta, olives, cucumbers, red onion) Lentil soup with celery, carrots made with vegetable broth, garlic, salt and pepper  Tabouli salad: parsley, bulgur, mint, scallions, cucumbers, tomato, radishes, lemon juice, olive oil, salt and  pepper.

## 2021-03-07 NOTE — Progress Notes (Signed)
NEUROLOGY CONSULTATION NOTE  [name] MRN: [id number] DOB: 06/25/1933  Referring provider: Dr. Micheline Rough Primary care provider: Dr. Micheline Rough  Reason for consult:  Alzheimer's disease  Dear Dr Ethlyn Gallery:  Thank you for your kind referral of Willie Lynch for consultation of the above symptoms. Although his history is well known to you, please allow me to reiterate it for the purpose of our medical record. The patient was accompanied to the clinic by his son Willie Lynch who also provides collateral information. Records and images were personally reviewed where available.   HISTORY OF PRESENT ILLNESS: This is an 87 year old right-handed man with a history of hypertension, hyperlipidemia, CAD, RLS, presenting for evaluation of dementia. He underwent Neuropsychological testing with Dr. Nicole Kindred in 02/2020 with a diagnosis of Alzheimer's dementia, very mild at that time. There was note of significant memory storage problems. He had an MRI brain without contrast in 04/2020 which did not show any acute changes, there was mild diffuse atrophy, minimal chronic microvascular disease. He has been on Donepezil since 08/2019, Memantine was added in 04/2020 however on his PCP visit in 09/2020, patient and son were not aware if he was taking it and son reported short-term memory is getting worse.  He lives alone. His sons live in [place] and [place]. He feels his memory is not as good as it used to be. Willie Lynch started noticing minor short-term memory changes around 18 months ago where he would forget conversations. He repeats himself but Willie Lynch feels some of it is making conversation, some memory-related. He denies getting lost, Willie Lynch reminds him he has had a few issues driving, he got lost going to a familiar place one time. This was occurring infrequently in early 2021, he was able to get back to his house. He denies missing medications but Willie Lynch is not sure he is taking the Donepezil regularly, the  supply has not diminished compared to his other medications. He denies missing bill payments. He denies misplacing things frequently. He likes to read the paper, watch TV, play golf with his group of friends. Mood is okay, he gets frustrated at times. Willie Lynch denies any personality changes. Sleep is good. Willie Lynch denies any hygiene concerns. He heard better with assistive device in the office today but does not think he needs a hearing evaluation.  I personally reviewed MRI brain without contrast done 04/2020 which did not show any acute changes, there was mild cerebral atrophy and minimal chronic microvascular disease, remote lacunar insults in the corona radiata, left thalamus, midbrain, right cerebellum.  Laboratory Data: Lab Results  Component Value Date   TSH 3.18 08/25/2019   Lab Results  Component Value Date   VITAMINB12 225 09/24/2020     PAST MEDICAL HISTORY: Past Medical History:  Diagnosis Date   Abdominal pain, unspecified site    Acquired cyst of kidney    Allergic rhinitis, cause unspecified    CAD (coronary artery disease)    NSTEMI 5/10. Rotational atherectomy/PCI w Xience DES x 3 to RCA and rotation atherectomy /PCI w Xience DES to prox LAD   Diverticulosis of colon (without mention of hemorrhage)    last colon 11/05 by DrSamLe[facility] w divertics only   Elevated prostate specific antigen (PSA)    followed by Dr Terance Hart for urology   GERD (gastroesophageal reflux disease)    esophagitis   Hyperlipidemia    Hypertension    ACEI cough   Irritable bowel syndrome    Ischemic cardiomyopathy  mild echo (8/10) w EF 45-50%, diffuse hypokinesis, mild AI and mild MR   Lumbago    Migraine, unspecified, without mention of intractable migraine without mention of status migrainosus    Osteoarthrosis, unspecified whether generalized or localized, unspecified site     PAST SURGICAL HISTORY: Past Surgical History:  Procedure Laterality Date   ANGIOPLASTY     bilat inguinal hernia  repairs  7/06   Dr Hassell Done   CARDIAC CATHETERIZATION     CORONARY ANGIOPLASTY     x4   rotator cuff surgery     Dr. Shellia Carwin    MEDICATIONS: Current Outpatient Medications on File Prior to Visit  Medication Sig Dispense Refill   alfuzosin (UROXATRAL) 10 MG 24 hr tablet Take 10 mg by mouth daily.     amLODipine (NORVASC) 10 MG tablet TAKE 1 TABLET BY MOUTH EVERY DAY 90 tablet 1   aspirin 81 MG EC tablet Take 162 mg by mouth daily.     clopidogrel (PLAVIX) 75 MG tablet TAKE 1 TABLET(75 MG) BY MOUTH DAILY 90 tablet 3   donepezil (ARICEPT) 10 MG tablet TAKE 1 TABLET(10 MG) BY MOUTH AT BEDTIME 90 tablet 1   escitalopram (LEXAPRO) 5 MG tablet Take 1 tablet (5 mg total) by mouth daily. 90 tablet 1   famotidine (PEPCID) 20 MG tablet Take 1 tablet (20 mg total) by mouth daily as needed for heartburn or indigestion. 30 tablet 2   finasteride (PROSCAR) 5 MG tablet Take 5 mg by mouth daily.     losartan (COZAAR) 50 MG tablet Take 1 tablet PO BID 180 tablet 1   memantine (NAMENDA XR) 7 MG CP24 24 hr capsule Take 1 capsule (7 mg total) by mouth daily. 90 capsule 1   Multiple Vitamins-Minerals (MULTIVITAL) tablet Take 1 tablet by mouth daily.     nebivolol (BYSTOLIC) 5 MG tablet Take 0.5 tablets (2.5 mg total) by mouth daily. 45 tablet 1   nitroGLYCERIN (NITROSTAT) 0.4 MG SL tablet DISSOLVE 1 TABLET UNDER THE TONGUE IF NEEDED AS DIRECTED. 25 tablet 3   Omega-3 Fatty Acids (FISH OIL) 1000 MG CAPS Take 1 capsule by mouth daily.     pramipexole (MIRAPEX) 0.5 MG tablet Take 2 tablets (1 mg total) by mouth at bedtime as needed. 180 tablet 1   rosuvastatin (CRESTOR) 40 MG tablet Take 1 tablet (40 mg total) by mouth daily. 90 tablet 3   timolol (TIMOPTIC-XR) 0.5 % ophthalmic gel-forming Place 1 drop into both eyes every morning.  0   TRAVATAN Z 0.004 % SOLN ophthalmic solution Place 1 drop into both eyes every evening.  0   No current facility-administered medications on file prior to visit.     ALLERGIES: Allergies  Allergen Reactions   Amoxicillin-Pot Clavulanate     REACTION: pt developed HIVES on augmentin   Codeine    Morphine     FAMILY HISTORY: Family History  Problem Relation Age of Onset   Heart disease Father        smoker   Heart attack Father    Cancer Mother    Cancer Other        ?? not sure what kind    SOCIAL HISTORY: Social History   Socioeconomic History   Marital status: Widowed    Spouse name: Willie Quattrone)   Number of children: 2   Years of education: Not on file   Highest education level: Not on file  Occupational History   Occupation: Surveyor, mining: RETIRED  Occupation: previously worked for [employer]   Smoking status: Former    Types: Cigarettes    Quit date: 06/22/1961    Years since quitting: 59.7   Smokeless tobacco: Never  Substance and Sexual Activity   Alcohol use: Yes    Comment: 4 drinks per week    Drug use: No   Sexual activity: Not on file  Other Topics Concern   Not on file  Social History Narrative   Quit smoking 1963. Exercises. Caffeine- 2 cups per day. Previously worked for [employer], now Optometrist.    Social Determinants of Health   Financial Resource Strain: Low Risk    Difficulty of Paying Living Expenses: Not hard at all  Food Insecurity: No Food Insecurity   Worried About Charity fundraiser in the Last Year: Never true   [place] in the Last Year: Never true  Transportation Needs: No Transportation Needs   Lack of Transportation (Medical): No   Lack of Transportation (Non-Medical): No  Physical Activity: Insufficiently Active   Days of Exercise per Week: 3 days   Minutes of Exercise per Session: 30 min  Stress: No Stress Concern Present   Feeling of Stress : Not at all  Social Connections: Moderately Isolated   Frequency of Communication with Friends and Family: More than three times a week   Frequency of Social Gatherings with Friends and Family:  More than three times a week   Attends Religious Services: More than 4 times per year   Active Member of [employer] or Organizations: No   Attends Archivist Meetings: Never   Marital Status: Widowed  Human resources officer Violence: Not At Risk   Fear of Current or Ex-Partner: No   Emotionally Abused: No   Physically Abused: No   Sexually Abused: No     PHYSICAL EXAM: Vitals:   03/07/21 1256  BP: (!) 144/82  Pulse: 71  SpO2: 96%   General: No acute distress Head:  Normocephalic/atraumatic Skin/Extremities: No rash, no edema Neurological Exam: Mental status: alert and oriented to person, place, and time, no dysarthria or aphasia, Fund of knowledge is appropriate.  Recent and remote memory are impaired.  Attention and concentration are normal.    Able to name objects and repeat phrases. [place] 19/30 Montreal Cognitive Assessment  03/07/2021 03/08/2020 01/12/2020 08/27/2019  Visuospatial/ Executive (0/5) '3 3 3 4  '$ Naming (0/3) '3 3 2 2  '$ Attention: Read list of digits (0/2) '1 2 2 2  '$ Attention: Read list of letters (0/1) '1 1 1 1  '$ Attention: Serial 7 subtraction starting at 100 (0/3) '3 3 3 3  '$ Language: Repeat phrase (0/2) '2 1 2 2  '$ Language : Fluency (0/1) 0 0 1 1  Abstraction (0/2) '2 2 2 2  '$ Delayed Recall (0/5) 0 0 0 0  Orientation (0/6) '4 4 3 6  '$ Total '19 19 19 23  '$ Adjusted Score (based on education) '19 19 19 23    '$ Cranial nerves: CN I: not tested CN II: pupils equal, round and reactive to light, visual fields intact CN III, IV, VI:  full range of motion, no nystagmus, no ptosis CN V: facial sensation intact CN VII: upper and lower face symmetric CN VIII: hearing intact to conversation CN IX, X: gag intact, uvula midline CN XI: sternocleidomastoid and trapezius muscles intact CN XII: tongue midline Bulk & Tone: normal, no fasciculations, no cogwheeling. Motor: 5/5 throughout with no pronator drift. Sensation: intact to light touch,  cold, pin, vibration sense.  No extinction to  double simultaneous stimulation.  Romberg test negative Deep Tendon Reflexes: +1 throughout Cerebellar: no incoordination on finger to nose testing Gait: narrow-based and steady, no ataxia Tremor: no resting tremor, mild bilateral postural and endpoint tremors   IMPRESSION: This is an 87 year old right-handed man with a history of hypertension, hyperlipidemia, CAD, RLS, presenting for evaluation of dementia. He underwent Neuropsychological testing with Dr. Nicole Kindred in 02/2020 with a diagnosis of Alzheimer's dementia, very mild at that time. MRI brain no acute changes, mild diffuse atrophy. MOCA score today 19/30, similar to priors. He continues to live alone independently however there are some concerns about regularly taking the Donepezil, advised looking into pill packs. Continue to monitor driving. We discussed the diagnosis, prognosis, and continued close monitoring from family. We discussed the importance of control of vascular risk factors, physical exercise, brain stimulation exercises, and MIND diet for overall brain health. Continue Donepezil '10mg'$  daily. He has not been taking Memantine, would hold off for now and ensure compliance first with Donepezil. Consider hearing evaluation. Continue follow-up with PCP, they know to call for any changes.    Thank you for allowing me to participate in the care of this patient. Please do not hesitate to call for any questions or concerns.   Ellouise Newer, M.D.  CC: Dr. Ethlyn Gallery

## 2021-03-11 ENCOUNTER — Telehealth: Payer: Self-pay | Admitting: Pharmacist

## 2021-03-14 NOTE — Progress Notes (Signed)
Chronic Care Management Pharmacy Assistant   Name: Willie Lynch  MRN: [id number] DOB: 03-Dec-1933   Reason for Encounter: General Assessment Call    Conditions to be addressed/monitored: HTN  Recent office visits:  None  Recent consult visits:  03-07-2021 [name], MD (Neurology) - Patient presented for Late onset Alzheimer's disease without behavioral disturbance. Stopped Asprin, Memantine HCL, Timolol Mealate, and Travoprost.  Hospital visits:  None in previous 6 months  Medications: Outpatient Encounter Medications as of 03/11/2021  Medication Sig   alfuzosin (UROXATRAL) 10 MG 24 hr tablet Take 10 mg by mouth daily.   amLODipine (NORVASC) 10 MG tablet TAKE 1 TABLET BY MOUTH EVERY DAY   clopidogrel (PLAVIX) 75 MG tablet TAKE 1 TABLET(75 MG) BY MOUTH DAILY   donepezil (ARICEPT) 10 MG tablet TAKE 1 TABLET(10 MG) BY MOUTH AT BEDTIME   escitalopram (LEXAPRO) 5 MG tablet Take 1 tablet (5 mg total) by mouth daily.   famotidine (PEPCID) 20 MG tablet Take 1 tablet (20 mg total) by mouth daily as needed for heartburn or indigestion. (Patient not taking: Reported on 03/07/2021)   finasteride (PROSCAR) 5 MG tablet Take 5 mg by mouth daily.   losartan (COZAAR) 50 MG tablet Take 1 tablet PO BID   Multiple Vitamins-Minerals (MULTIVITAL) tablet Take 1 tablet by mouth daily.   nebivolol (BYSTOLIC) 5 MG tablet Take 0.5 tablets (2.5 mg total) by mouth daily.   nitroGLYCERIN (NITROSTAT) 0.4 MG SL tablet DISSOLVE 1 TABLET UNDER THE TONGUE IF NEEDED AS DIRECTED.   Omega-3 Fatty Acids (FISH OIL) 1000 MG CAPS Take 1 capsule by mouth daily.   pramipexole (MIRAPEX) 0.5 MG tablet Take 2 tablets (1 mg total) by mouth at bedtime as needed.   rosuvastatin (CRESTOR) 40 MG tablet Take 1 tablet (40 mg total) by mouth daily.   No facility-administered encounter medications on file as of 03/11/2021.  Reviewed chart prior to disease state call. Spoke with patient regarding BP  Recent Office  Vitals: BP Readings from Last 3 Encounters:  03/07/21 (!) 144/82  09/30/20 140/72  04/05/20 120/82   Pulse Readings from Last 3 Encounters:  03/07/21 71  09/30/20 71  04/05/20 74    Wt Readings from Last 3 Encounters:  03/07/21 182 lb 9.6 oz (82.8 kg)  09/30/20 184 lb 4.8 oz (83.6 kg)  04/05/20 188 lb (85.3 kg)     Kidney Function Lab Results  Component Value Date/Time   CREATININE 0.77 10/14/2020 09:15 AM   CREATININE 0.70 09/24/2020 08:14 AM   CREATININE 0.85 01/12/2020 09:27 AM   GFR 81.78 10/14/2020 09:15 AM   GFRNONAA >60 11/28/2018 08:46 AM   GFRAA >60 11/28/2018 08:46 AM    BMP Latest Ref Rng & Units 10/14/2020 09/24/2020 01/12/2020  Glucose 70 - 99 mg/dL 102(H) 87 102(H)  BUN 6 - 23 mg/dL 13 12 16   Creatinine 0.40 - 1.50 mg/dL 0.77 0.70 0.85  BUN/Creat Ratio 6 - 22 (calc) - - NOT APPLICABLE  Sodium 536 - 145 mEq/L 138 140 137  Potassium 3.5 - 5.1 mEq/L 3.6 3.6 3.8  Chloride 96 - 112 mEq/L 105 104 104  CO2 19 - 32 mEq/L 23 25 27   Calcium 8.4 - 10.5 mg/dL 9.0 9.1 9.2    Current antihypertensive regimen:  Amlodipine 10mg , 1 tablet once dialy Losartan 50mg , 1 tablet twice daily  Notes:  Call to patient left VM with son for return call/ pts mailbox full 03-14-21 03-18-21 patients mailbox full/ left vm with son  9-29 3rd attempt to reach patient/son  Care Gaps: Zoster Vaccines - Overdue COVID Booster #4 Therapist, music) Flu Vaccine - Overdue AWV - Done 07-31-2020 CCM - Need BP-144/82 (Neurologist) Last office was in 2021 120/82  Star Rating Drugs: Rosuvastatin (Crestor) 40 mg - Last filled 03-30-2020 90 DS at [facility] Losartan (Cozaar) 50 mg - Last filled 09-30-2020 90 DS at [facility] : Call to [facility] spoke to [facility] she verified the dates below as accurate Rosuvastatin (Crestor) 40 mg - Last filled 03-30-2020 90 DS at [facility] Losartan (Cozaar) 50 mg - Last filled 09-30-2020 90 DS  [facility] Pharmacist  Assistant [phone]

## 2021-08-04 ENCOUNTER — Other Ambulatory Visit: Payer: Self-pay | Admitting: Family Medicine

## 2021-08-11 ENCOUNTER — Ambulatory Visit (INDEPENDENT_AMBULATORY_CARE_PROVIDER_SITE_OTHER): Payer: Medicare Other | Admitting: Family Medicine

## 2021-08-11 ENCOUNTER — Encounter: Payer: Self-pay | Admitting: Family Medicine

## 2021-08-11 ENCOUNTER — Ambulatory Visit (INDEPENDENT_AMBULATORY_CARE_PROVIDER_SITE_OTHER): Payer: Medicare Other

## 2021-08-11 VITALS — BP 102/62 | HR 73 | Temp 97.8°F | Ht 71.0 in | Wt 177.0 lb

## 2021-08-11 VITALS — BP 102/62 | HR 73 | Temp 97.8°F | Ht 71.0 in | Wt 177.2 lb

## 2021-08-11 DIAGNOSIS — I10 Essential (primary) hypertension: Secondary | ICD-10-CM | POA: Diagnosis not present

## 2021-08-11 DIAGNOSIS — I2510 Atherosclerotic heart disease of native coronary artery without angina pectoris: Secondary | ICD-10-CM | POA: Diagnosis not present

## 2021-08-11 DIAGNOSIS — Z23 Encounter for immunization: Secondary | ICD-10-CM | POA: Diagnosis not present

## 2021-08-11 DIAGNOSIS — E538 Deficiency of other specified B group vitamins: Secondary | ICD-10-CM | POA: Diagnosis not present

## 2021-08-11 DIAGNOSIS — Z0000 Encounter for general adult medical examination without abnormal findings: Secondary | ICD-10-CM | POA: Diagnosis not present

## 2021-08-11 LAB — COMPREHENSIVE METABOLIC PANEL
ALT: 9 U/L (ref 0–53)
AST: 15 U/L (ref 0–37)
Albumin: 4.2 g/dL (ref 3.5–5.2)
Alkaline Phosphatase: 82 U/L (ref 39–117)
BUN: 15 mg/dL (ref 6–23)
CO2: 33 mEq/L — ABNORMAL HIGH (ref 19–32)
Calcium: 9.4 mg/dL (ref 8.4–10.5)
Chloride: 102 mEq/L (ref 96–112)
Creatinine, Ser: 0.82 mg/dL (ref 0.40–1.50)
GFR: 79.78 mL/min (ref >60.00)
Glucose, Bld: 100 mg/dL — ABNORMAL HIGH (ref 70–99)
Potassium: 4.3 mEq/L (ref 3.5–5.1)
Sodium: 138 mEq/L (ref 135–145)
Total Bilirubin: 1.5 mg/dL — ABNORMAL HIGH (ref 0.2–1.2)
Total Protein: 6.7 g/dL (ref 6.0–8.3)

## 2021-08-11 LAB — CBC WITH DIFFERENTIAL/PLATELET
Basophils Absolute: 0 10*3/uL (ref 0.0–0.1)
Basophils Relative: 0.5 % (ref 0.0–3.0)
Eosinophils Absolute: 0.1 10*3/uL (ref 0.0–0.7)
Eosinophils Relative: 2.6 % (ref 0.0–5.0)
HCT: 43.9 % (ref 39.0–52.0)
Hemoglobin: 14.8 g/dL (ref 13.0–17.0)
Lymphocytes Relative: 24 % (ref 12.0–46.0)
Lymphs Abs: 1.3 10*3/uL (ref 0.7–4.0)
MCHC: 33.7 g/dL (ref 30.0–36.0)
MCV: 92.4 fl (ref 78.0–100.0)
Monocytes Absolute: 0.6 10*3/uL (ref 0.1–1.0)
Monocytes Relative: 10.3 % (ref 3.0–12.0)
Neutro Abs: 3.4 10*3/uL (ref 1.4–7.7)
Neutrophils Relative %: 62.6 % (ref 43.0–77.0)
Platelets: 90 10*3/uL — ABNORMAL LOW (ref 150.0–400.0)
RBC: 4.76 Mil/uL (ref 4.22–5.81)
RDW: 14.5 % (ref 11.5–15.5)
WBC: 5.4 10*3/uL (ref 4.0–10.5)

## 2021-08-11 LAB — LIPID PANEL
Cholesterol: 151 mg/dL (ref 0–200)
HDL: 36 mg/dL — ABNORMAL LOW (ref >39.00)
LDL Cholesterol: 91 mg/dL (ref 0–99)
NonHDL: 115.05
Total CHOL/HDL Ratio: 4
Triglycerides: 122 mg/dL (ref 0.0–149.0)
VLDL: 24.4 mg/dL (ref 0.0–40.0)

## 2021-08-11 LAB — VITAMIN B12: Vitamin B-12: 172 pg/mL — ABNORMAL LOW (ref 211–911)

## 2021-08-11 LAB — FOLATE: Folate: 11.3 ng/mL (ref >5.9)

## 2021-08-11 MED ORDER — LOSARTAN POTASSIUM 50 MG PO TABS: 50.0000 mg | ORAL_TABLET | Freq: Every day | ORAL | 1 refills | Status: DC

## 2021-08-11 NOTE — Progress Notes (Signed)
Subjective:   Willie Lynch is a 88 y.o. male who presents for Medicare Annual/Subsequent preventive examination.  Review of Systems       Objective:    Today's Vitals   08/11/21 0928  BP: 102/62  Pulse: 73  Temp: 97.8 F (36.6 C)  TempSrc: Oral  SpO2: 96%  Weight: 177 lb (80.3 kg)  Height: 5\' 11"  (1.803 m)   Body mass index is 24.69 kg/m.  Advanced Directives 08/11/2021 03/07/2021 07/31/2020 09/24/2015  Does Patient Have a Medical Advance Directive? Yes Yes No No  Type of Paramedic of [place];Living will [place];Living will - -  Does patient want to make changes to medical advance directive? No - Patient declined - - -  Copy of [place] in Chart? No - copy requested - - -  Would patient like information on creating a medical advance directive? - - No - Patient declined No - patient declined information    Current Medications (verified) Outpatient Encounter Medications as of 08/11/2021  Medication Sig   alfuzosin (UROXATRAL) 10 MG 24 hr tablet Take 10 mg by mouth daily.   amLODipine (NORVASC) 10 MG tablet TAKE 1 TABLET BY MOUTH EVERY DAY   clopidogrel (PLAVIX) 75 MG tablet TAKE 1 TABLET(75 MG) BY MOUTH DAILY   donepezil (ARICEPT) 10 MG tablet TAKE 1 TABLET(10 MG) BY MOUTH AT BEDTIME   escitalopram (LEXAPRO) 5 MG tablet Take 1 tablet (5 mg total) by mouth daily.   famotidine (PEPCID) 20 MG tablet Take 1 tablet (20 mg total) by mouth daily as needed for heartburn or indigestion.   finasteride (PROSCAR) 5 MG tablet Take 5 mg by mouth daily.   losartan (COZAAR) 50 MG tablet Take 1 tablet (50 mg total) by mouth daily. Take 1 tablet PO BID   Multiple Vitamins-Minerals (MULTIVITAL) tablet Take 1 tablet by mouth daily.   nebivolol (BYSTOLIC) 5 MG tablet Take 0.5 tablets (2.5 mg total) by mouth daily.   nitroGLYCERIN (NITROSTAT) 0.4 MG SL tablet DISSOLVE 1 TABLET UNDER THE TONGUE IF NEEDED AS DIRECTED.   Omega-3 Fatty  Acids (FISH OIL) 1000 MG CAPS Take 1 capsule by mouth daily.   pramipexole (MIRAPEX) 0.5 MG tablet TAKE 2 TABLETS(1 MG) BY MOUTH AT BEDTIME AS NEEDED   rosuvastatin (CRESTOR) 40 MG tablet Take 1 tablet (40 mg total) by mouth daily.   No facility-administered encounter medications on file as of 08/11/2021.    Allergies (verified) Amoxicillin-pot clavulanate, Codeine, and Morphine   History: Past Medical History:  Diagnosis Date   Abdominal pain, unspecified site    Acquired cyst of kidney    Allergic rhinitis, cause unspecified    CAD (coronary artery disease)    NSTEMI 5/10. Rotational atherectomy/PCI w Xience DES x 3 to RCA and rotation atherectomy /PCI w Xience DES to prox LAD   Diverticulosis of colon (without mention of hemorrhage)    last colon 11/05 by DrSamLeB w divertics only   Elevated prostate specific antigen (PSA)    followed by Dr Terance Hart for urology   GERD (gastroesophageal reflux disease)    esophagitis   Hyperlipidemia    Hypertension    ACEI cough   Irritable bowel syndrome    Ischemic cardiomyopathy    mild echo (8/10) w EF 45-50%, diffuse hypokinesis, mild AI and mild MR   Lumbago    Migraine, unspecified, without mention of intractable migraine without mention of status migrainosus    Osteoarthrosis, unspecified whether generalized  or localized, unspecified site    Past Surgical History:  Procedure Laterality Date   ANGIOPLASTY     bilat inguinal hernia repairs  7/06   Dr Hassell Done   CARDIAC CATHETERIZATION     CORONARY ANGIOPLASTY     x4   rotator cuff surgery     Dr. Shellia Carwin   Family History  Problem Relation Age of Onset   Heart disease Father        smoker   Heart attack Father    Cancer Mother    Cancer Other        ?? not sure what kind   Social History   Socioeconomic History   Marital status: Widowed    Spouse name: Jakwan Sally)   Number of children: 2   Years of education: Not on file   Highest education level: Not on file   Occupational History   Occupation: Surveyor, mining: RETIRED   Occupation: previously worked for [employer] Use   Smoking status: Former    Types: Cigarettes    Quit date: 06/22/1961    Years since quitting: 60.1   Smokeless tobacco: Never  Vaping Use   Vaping Use: Never used  Substance and Sexual Activity   Alcohol use: Yes    Comment: 4 drinks per week    Drug use: No   Sexual activity: Not on file  Other Topics Concern   Not on file  Social History Narrative   Quit smoking 1963. Exercises. Caffeine- 2 cups per day. Previously worked for [employer], now Optometrist. Right handed    Social Determinants of Health   Financial Resource Strain: Low Risk    Difficulty of Paying Living Expenses: Not hard at all  Food Insecurity: No Food Insecurity   Worried About Charity fundraiser in the Last Year: Never true   Ran Out of Food in the Last Year: Never true  Transportation Needs: No Transportation Needs   Lack of Transportation (Medical): No   Lack of Transportation (Non-Medical): No  Physical Activity: Insufficiently Active   Days of Exercise per Week: 3 days   Minutes of Exercise per Session: 30 min  Stress: No Stress Concern Present   Feeling of Stress : Not at all  Social Connections: Moderately Integrated   Frequency of Communication with Friends and Family: More than three times a week   Frequency of Social Gatherings with Friends and Family: More than three times a week   Attends Religious Services: More than 4 times per year   Active Member of [employer] or Organizations: Yes   Attends Archivist Meetings: More than 4 times per year   Marital Status: Widowed     Clinical Intake:  Pre-visit preparation completed: Yes  Diabetic?    Activities of Daily Living In your present state of health, do you have any difficulty performing the following activities: 08/11/2021  Hearing? N  Vision? N  Difficulty concentrating or making  decisions? N  Walking or climbing stairs? N  Dressing or bathing? N  Doing errands, shopping? N  Preparing Food and eating ? N  Using the Toilet? N  In the past six months, have you accidently leaked urine? N  Do you have problems with loss of bowel control? N  Managing your Medications? N  Managing your Finances? N  Housekeeping or managing your Housekeeping? N  Some recent data might be hidden    Patient Care Team: [name], MD as  PCP - General (Family Medicine) Luberta Mutter, MD as Consulting Physician (Ophthalmology) Larey Dresser, MD as Consulting Physician (Cardiology) Festus Aloe, MD as Consulting Physician (Urology) Sydnee Cabal, MD as Consulting Physician (Orthopedic Surgery) Earnie Larsson, [facility] as Pharmacist (Pharmacist) [name], MD as Consulting Physician (Neurology)  Indicate any recent Medical Services you may have received from other than [facility] providers in the past year (date may be approximate).     Assessment:   This is a routine wellness examination for [place].  Hearing/Vision screen Hearing Screening - Comments:: No difficulty hearing  Vision Screening - Comments:: Wears reading glasses  Dietary issues and exercise activities discussed: Exercise limited by: None identified   Goals Addressed             This Visit's Progress    Patient Stated       I will continue to go to the gym 3x per week.       Depression Screen PHQ 2/9 Scores 08/11/2021 08/11/2021 07/31/2020 04/12/2019  PHQ - 2 Score 0 0 0 0  PHQ- 9 Score 0 0 - -    Fall Risk Fall Risk  08/11/2021 03/07/2021 07/31/2020  Falls in the past year? 0 0 0  Number falls in past yr: 0 0 0  Injury with Fall? 0 0 0  Risk for fall due to : No Fall Risks - No Fall Risks  Follow up - - Falls evaluation completed;Falls prevention discussed    FALL RISK PREVENTION PERTAINING TO THE HOME:  Any stairs in or around the home? Yes  If so, are there any without  handrails? No  Home free of loose throw rugs in walkways, pet beds, electrical cords, etc? Yes  Adequate lighting in your home to reduce risk of falls? Yes   ASSISTIVE DEVICES UTILIZED TO PREVENT FALLS:  Life alert? No  Use of a cane, walker or w/c? No  Grab bars in the bathroom? Yes  Shower chair or bench in shower? No  Elevated toilet seat or a handicapped toilet? No   TIMED UP AND GO:  Was the test performed? Yes .  Length of time to ambulate 10 feet: 5 sec.   Gait steady and fast without use of assistive device  Cognitive Function:   Montreal Cognitive Assessment  03/07/2021 03/08/2020 01/12/2020 08/27/2019  Visuospatial/ Executive (0/5) 3 3 3 4   Naming (0/3) 3 3 2 2   Attention: Read list of digits (0/2) 1 2 2 2   Attention: Read list of letters (0/1) 1 1 1 1   Attention: Serial 7 subtraction starting at 100 (0/3) 3 3 3 3   Language: Repeat phrase (0/2) 2 1 2 2   Language : Fluency (0/1) 0 0 1 1  Abstraction (0/2) 2 2 2 2   Delayed Recall (0/5) 0 0 0 0  Orientation (0/6) 4 4 3 6   Total 19 19 19 23   Adjusted Score (based on education) 19 19 19 23    6CIT Screen 08/11/2021  What Year? 0 points  What month? 3 points  What time? 0 points  Count back from 20 0 points  Months in reverse 0 points  Repeat phrase 2 points  Total Score 5    Immunizations Immunization History  Administered Date(s) Administered   Fluad Quad(high Dose 65+) 04/05/2020   Influenza Split 04/09/2011, 04/07/2013   Influenza Whole 03/21/2008   Influenza, High Dose Seasonal PF 04/09/2014, 04/20/2016, 03/31/2017, 04/21/2018, 04/05/2019   Influenza-Unspecified 03/23/2015   PFIZER(Purple Top)SARS-COV-2 Vaccination 07/14/2019, 08/04/2019, 04/26/2020  PNEUMOCOCCAL CONJUGATE-20 08/11/2021   Pneumococcal Polysaccharide-23 01/12/2020   Tdap 01/13/2020    TDAP status: Up to date  Flu Vaccine status: Due, Education has been provided regarding the importance of this vaccine. Advised may receive this vaccine at  local pharmacy or Health Dept. Aware to provide a copy of the vaccination record if obtained from local pharmacy or Health Dept. Verbalized acceptance and understanding.  Pneumococcal vaccine status: Up to date  Covid-19 vaccine status: Declined, Education has been provided regarding the importance of this vaccine but patient still declined. Advised may receive this vaccine at local pharmacy or Health Dept.or vaccine clinic. Aware to provide a copy of the vaccination record if obtained from local pharmacy or Health Dept. Verbalized acceptance and understanding.  Qualifies for Shingles Vaccine? Yes   Zostavax completed No   Shingrix Completed?: No.    Education has been provided regarding the importance of this vaccine. Patient has been advised to call insurance company to determine out of pocket expense if they have not yet received this vaccine. Advised may also receive vaccine at local pharmacy or Health Dept. Verbalized acceptance and understanding.  Screening Tests Health Maintenance  Topic Date Due   COVID-19 Vaccine (4 - Booster for Pfizer series) 08/27/2021 (Originally 06/21/2020)   INFLUENZA VACCINE  09/19/2021 (Originally 01/20/2021)   Zoster Vaccines- Shingrix (1 of 2) 11/08/2021 (Originally 07/12/1952)   TETANUS/TDAP  01/12/2030   Pneumonia Vaccine 78+ Years old  Completed   HPV VACCINES  Aged Out    Health Maintenance  There are no preventive care reminders to display for this patient.   Colorectal cancer screening: No longer required.   Lung Cancer Screening: (Low Dose CT Chest recommended if Age 70-80 years, 30 pack-year currently smoking OR have quit w/in 15years.) does not qualify.   Lung Cancer Screening Referral:    Additional Screening:  Hepatitis C Screening: does not qualify; Completed   Vision Screening: Recommended annual ophthalmology exams for early detection of glaucoma and other disorders of the eye. Is the patient up to date with their annual eye exam?   Yes  Who is the provider or what is the name of the office in which the patient attends annual eye exams?  If pt is not established with a provider, would they like to be referred to a provider to establish care? No .   Dental Screening: Recommended annual dental exams for proper oral hygiene  Community Resource Referral / Chronic Care Management:  CRR required this visit?  No   CCM required this visit?  No      Plan:     I have personally reviewed and noted the following in the patients chart:   Medical and social history Use of alcohol, tobacco or illicit drugs  Current medications and supplements including opioid prescriptions. Patient is not currently taking opioid prescriptions. Functional ability and status Nutritional status Physical activity Advanced directives List of other physicians Hospitalizations, surgeries, and ER visits in previous 12 months Vitals Screenings to include cognitive, depression, and falls Referrals and appointments  In addition, I have reviewed and discussed with patient certain preventive protocols, quality metrics, and best practice recommendations. A written personalized care plan for preventive services as well as general preventive health recommendations were provided to patient.     [name], LPN   12/17/3660   Nurse Notes: None

## 2021-08-11 NOTE — Patient Instructions (Addendum)
Willie Lynch , Thank you for taking time to come for your Medicare Wellness Visit. I appreciate your ongoing commitment to your health goals. Please review the following plan we discussed and let me know if I can assist you in the future.   These are the goals we discussed:  Goals      Patient Stated     I will continue to go to the gym 3x per week.     Pharmacy Care Plan     CARE PLAN ENTRY (see longitudinal plan of care for additional care plan information)  Current Barriers:  Chronic Disease Management support, education, and care coordination needs related to Hypertension, Hyperlipidemia, GERD, Osteoarthritis, BPH, and Memory deficit, RLS, coronary atherosclerosis   Hypertension BP Readings from Last 3 Encounters:  01/12/20 (!) 116/62  08/25/19 (!) 112/58  04/12/19 (!) 90/50  Pharmacist Clinical Goal(s): Over the next 90 days, patient will work with PharmD and providers to maintain BP goal <130/80 Current regimen:  Amlodipine 10mg , 1 tablet once dialy Losartan 50mg , 1 tablet twice daily nebivolol (Bystolic) 5 mg, 0.5 tablets once daily Patient self care activities - Over the next 90 days, patient will: Check BP , document, and provide at future appointments Ensure daily salt intake < 2300 mg/day  Hyperlipidemia Lab Results  Component Value Date/Time   LDLCALC 50 01/12/2020 09:27 AM  Pharmacist Clinical Goal(s): Over the next 90 days, patient will work with PharmD and providers to maintain LDL goal < 70 Current regimen:  Rosuvastatin 40mg , 1 tablet once daily  Omega-3 (fish oil) 1000mg , 1 capsule once daily Patient self care activities - Over the next 90 days, patient will: Continue current medications as directed by provider.   Coronary atherosclerosis Pharmacist Clinical Goal(s) Over the next 90 days, patient will work with PharmD and providers to reduce reoccurrence of heart events Current regimen:  Clopidogrel 75mg , 1 tablet once daily  Nitroglycerin 0.4mg , 1  tablet under tongue if needed as directed  Patient self care activities - Over the next 90 days, patient will: Continue current medications as directed by provider.   Memory deficit Pharmacist Clinical Goal(s) Over the next 90 days, patient will work with PharmD and providers to reduce worsening of memory.  Current regimen:  Donepezil 5mg , 1 tablet at bedtime  Patient self care activities Patient will continue current medications as directed by providers.  GERD Pharmacist Clinical Goal(s) Over the next 90 days, patient will work with PharmD and providers to reduce heartburn symptoms.  Current regimen:  Famotidine 20mg , 1 tablet daily as needed for heartburn or indigestion Patient self care activities Patient will continue current medications as directed by provider.   Benign prostatic hyperplasia Pharmacist Clinical Goal(s) Over the next 90 days, patient will work with PharmD and providers to reduce symptoms of enlarged prostate.  Current regimen:  alfuzosin 10mg , 1 tablet once daily Finasteride 5mg , 1 tablet once daily  Patient self care activities Patient will continue current medications as directed by provider   Restless legs syndrome (RLS)  Pharmacist Clinical Goal(s) Over the next 90 days, patient will work with PharmD and providers to reduce symptoms.  Current regimen:  Pramipexole 0.5mg , 2 to 3 tablets every night 1 hour before bedtime Patient self care activities Patient will continue current medications as directed by provider  Osteoarthritis Pharmacist Clinical Goal(s) Over the next 90 days, patient will work with PharmD and providers to minimize pain Current regimen:  Diclofenac 1% gel, apply 2 g four times daily Patient self care  activities Patient will continue current medications as directed by provider   Medication management Pharmacist Clinical Goal(s): Over the next 90 days, patient will work with PharmD and providers to achieve optimal medication  adherence Current pharmacy: [facility] Interventions Comprehensive medication review performed. Continue current medication management strategy Reviewed [facility] pharmacy services for medication synchronization, packaging and delivery Patient self care activities - Over the next 90 days, patient will: Take medications as prescribed Report any questions or concerns to PharmD and/or provider(s)  Initial goal documentation         This is a list of the screening recommended for you and due dates:  Health Maintenance  Topic Date Due   COVID-19 Vaccine (4 - Booster for Pfizer series) 08/27/2021*   Flu Shot  09/19/2021*   Zoster (Shingles) Vaccine (1 of 2) 11/08/2021*   Tetanus Vaccine  01/12/2030   Pneumonia Vaccine  Completed   HPV Vaccine  Aged Out  *Topic was postponed. The date shown is not the original due date.   Advanced directives: Yes  Conditions/risks identified: None  Next appointment: Follow up in one year for your annual wellness visit.   Preventive Care 12 Years and Older, Male Preventive care refers to lifestyle choices and visits with your health care provider that can promote health and wellness. What does preventive care include? A yearly physical exam. This is also called an annual well check. Dental exams once or twice a year. Routine eye exams. Ask your health care provider how often you should have your eyes checked. Personal lifestyle choices, including: Daily care of your teeth and gums. Regular physical activity. Eating a healthy diet. Avoiding tobacco and drug use. Limiting alcohol use. Practicing safe sex. Taking low doses of aspirin every day. Taking vitamin and mineral supplements as recommended by your health care provider. What happens during an annual well check? The services and screenings done by your health care provider during your annual well check will depend on your age, overall health, lifestyle risk factors, and family history of  disease. Counseling  Your health care provider may ask you questions about your: Alcohol use. Tobacco use. Drug use. Emotional well-being. Home and relationship well-being. Sexual activity. Eating habits. History of falls. Memory and ability to understand (cognition). Work and work Statistician. Screening  You may have the following tests or measurements: Height, weight, and BMI. Blood pressure. Lipid and cholesterol levels. These may be checked every 5 years, or more frequently if you are over 74 years old. Skin check. Lung cancer screening. You may have this screening every year starting at age 76 if you have a 30-pack-year history of smoking and currently smoke or have quit within the past 15 years. Fecal occult blood test (FOBT) of the stool. You may have this test every year starting at age 51. Flexible sigmoidoscopy or colonoscopy. You may have a sigmoidoscopy every 5 years or a colonoscopy every 10 years starting at age 71. Prostate cancer screening. Recommendations will vary depending on your family history and other risks. Hepatitis C blood test. Hepatitis B blood test. Sexually transmitted disease (STD) testing. Diabetes screening. This is done by checking your blood sugar (glucose) after you have not eaten for a while (fasting). You may have this done every 1-3 years. Abdominal aortic aneurysm (AAA) screening. You may need this if you are a current or former smoker. Osteoporosis. You may be screened starting at age 40 if you are at high risk. Talk with your health care provider about your test results,  treatment options, and if necessary, the need for more tests. Vaccines  Your health care provider may recommend certain vaccines, such as: Influenza vaccine. This is recommended every year. Tetanus, diphtheria, and acellular pertussis (Tdap, Td) vaccine. You may need a Td booster every 10 years. Zoster vaccine. You may need this after age 18. Pneumococcal 13-valent  conjugate (PCV13) vaccine. One dose is recommended after age 50. Pneumococcal polysaccharide (PPSV23) vaccine. One dose is recommended after age 23. Talk to your health care provider about which screenings and vaccines you need and how often you need them. This information is not intended to replace advice given to you by your health care provider. Make sure you discuss any questions you have with your health care provider. Document Released: 07/05/2015 Document Revised: 02/26/2016 Document Reviewed: 04/09/2015 Elsevier Interactive Patient Education  2017 [employer] Prevention in the Home Falls can cause injuries. They can happen to people of all ages. There are many things you can do to make your home safe and to help prevent falls. What can I do on the outside of my home? Regularly fix the edges of walkways and driveways and fix any cracks. Remove anything that might make you trip as you walk through a door, such as a raised step or threshold. Trim any bushes or trees on the path to your home. Use bright outdoor lighting. Clear any walking paths of anything that might make someone trip, such as rocks or tools. Regularly check to see if handrails are loose or broken. Make sure that both sides of any steps have handrails. Any raised decks and porches should have guardrails on the edges. Have any leaves, snow, or ice cleared regularly. Use sand or salt on walking paths during winter. Clean up any spills in your garage right away. This includes oil or grease spills. What can I do in the bathroom? Use night lights. Install grab bars by the toilet and in the tub and shower. Do not use towel bars as grab bars. Use non-skid mats or decals in the tub or shower. If you need to sit down in the shower, use a plastic, non-slip stool. Keep the floor dry. Clean up any water that spills on the floor as soon as it happens. Remove soap buildup in the tub or shower regularly. Attach bath mats  securely with double-sided non-slip rug tape. Do not have throw rugs and other things on the floor that can make you trip. What can I do in the bedroom? Use night lights. Make sure that you have a light by your bed that is easy to reach. Do not use any sheets or blankets that are too big for your bed. They should not hang down onto the floor. Have a firm chair that has side arms. You can use this for support while you get dressed. Do not have throw rugs and other things on the floor that can make you trip. What can I do in the kitchen? Clean up any spills right away. Avoid walking on wet floors. Keep items that you use a lot in easy-to-reach places. If you need to reach something above you, use a strong step stool that has a grab bar. Keep electrical cords out of the way. Do not use floor polish or wax that makes floors slippery. If you must use wax, use non-skid floor wax. Do not have throw rugs and other things on the floor that can make you trip. What can I do with my stairs? Do  not leave any items on the stairs. Make sure that there are handrails on both sides of the stairs and use them. Fix handrails that are broken or loose. Make sure that handrails are as long as the stairways. Check any carpeting to make sure that it is firmly attached to the stairs. Fix any carpet that is loose or worn. Avoid having throw rugs at the top or bottom of the stairs. If you do have throw rugs, attach them to the floor with carpet tape. Make sure that you have a light switch at the top of the stairs and the bottom of the stairs. If you do not have them, ask someone to add them for you. What else can I do to help prevent falls? Wear shoes that: Do not have high heels. Have rubber bottoms. Are comfortable and fit you well. Are closed at the toe. Do not wear sandals. If you use a stepladder: Make sure that it is fully opened. Do not climb a closed stepladder. Make sure that both sides of the stepladder  are locked into place. Ask someone to hold it for you, if possible. Clearly mark and make sure that you can see: Any grab bars or handrails. First and last steps. Where the edge of each step is. Use tools that help you move around (mobility aids) if they are needed. These include: Canes. Walkers. Scooters. Crutches. Turn on the lights when you go into a dark area. Replace any light bulbs as soon as they burn out. Set up your furniture so you have a clear path. Avoid moving your furniture around. If any of your floors are uneven, fix them. If there are any pets around you, be aware of where they are. Review your medicines with your doctor. Some medicines can make you feel dizzy. This can increase your chance of falling. Ask your doctor what other things that you can do to help prevent falls. This information is not intended to replace advice given to you by your health care provider. Make sure you discuss any questions you have with your health care provider. Document Released: 04/04/2009 Document Revised: 11/14/2015 Document Reviewed: 07/13/2014 Elsevier Interactive Patient Education  2017 [employer].

## 2021-08-11 NOTE — Patient Instructions (Signed)
Check blood pressures at home and keep log for approximately next 1-2 weeks. If you are regularly seeing pressures less than 110/70 we will cut back the losartan to 1/2 tab (25mg ) once daily. Continue to check and we can stop that med if still running below 110/70.

## 2021-08-11 NOTE — Progress Notes (Signed)
TYESON TANIMOTO DOB: 1933-08-12 Encounter date: 08/11/2021  This is a 88 y.o. male who presents with Chief Complaint  Patient presents with   Follow-up    History of present illness: Last visit with me was 09/30/20. Did follow with neurology in 02/2021. Completed neuropsych testing with dx of alzheimers.   Son thinks that he had flu and covid in 2022, but not sure dates. Son will check with pharmacy and let us know (they are currently closed)   HTN: bystolic 2.5mg , losartan 50mg , amlodipine 10mg . Not checking regularly at home. No headaches, chest pain, pressure.    HL: crestor 40mg  daily - no muscle aches/cramps   Memory impairment: taking donepezil regularly. Patient states not like it used to be.   Restless legs: mirapex.   Not exercising regularly. Was going to club regularly, but not now.   Not needing pepcid; acid reflux hasn't been an issue.   Appetite is good. Breakfast and lunch at home; dinner out.     Allergies  Allergen Reactions   Amoxicillin-Pot Clavulanate     REACTION: pt developed HIVES on augmentin   Codeine    Morphine    Current Meds  Medication Sig   alfuzosin (UROXATRAL) 10 MG 24 hr tablet Take 10 mg by mouth daily.   amLODipine (NORVASC) 10 MG tablet TAKE 1 TABLET BY MOUTH EVERY DAY   clopidogrel (PLAVIX) 75 MG tablet TAKE 1 TABLET(75 MG) BY MOUTH DAILY   donepezil (ARICEPT) 10 MG tablet TAKE 1 TABLET(10 MG) BY MOUTH AT BEDTIME   escitalopram (LEXAPRO) 5 MG tablet Take 1 tablet (5 mg total) by mouth daily.   famotidine (PEPCID) 20 MG tablet Take 1 tablet (20 mg total) by mouth daily as needed for heartburn or indigestion.   finasteride (PROSCAR) 5 MG tablet Take 5 mg by mouth daily.   Multiple Vitamins-Minerals (MULTIVITAL) tablet Take 1 tablet by mouth daily.   nebivolol (BYSTOLIC) 5 MG tablet Take 0.5 tablets (2.5 mg total) by mouth daily.   nitroGLYCERIN (NITROSTAT) 0.4 MG SL tablet DISSOLVE 1 TABLET UNDER THE TONGUE IF NEEDED AS DIRECTED.    Omega-3 Fatty Acids (FISH OIL) 1000 MG CAPS Take 1 capsule by mouth daily.   pramipexole (MIRAPEX) 0.5 MG tablet TAKE 2 TABLETS(1 MG) BY MOUTH AT BEDTIME AS NEEDED   rosuvastatin (CRESTOR) 40 MG tablet Take 1 tablet (40 mg total) by mouth daily.   [DISCONTINUED] losartan (COZAAR) 50 MG tablet Take 1 tablet PO BID    Review of Systems  Constitutional:  Negative for chills, fatigue and fever.  Respiratory:  Negative for cough, chest tightness, shortness of breath and wheezing.   Cardiovascular:  Negative for chest pain, palpitations and leg swelling.  Musculoskeletal:  Negative for arthralgias and back pain.  Neurological:  Negative for dizziness, light-headedness and headaches.  Psychiatric/Behavioral:  Negative for agitation and sleep disturbance. The patient is not nervous/anxious.    Objective:  BP 102/62 (BP Location: Left Arm, Patient Position: Sitting, Cuff Size: Large)    Pulse 73    Temp 97.8 F (36.6 C) (Oral)    Ht 5\' 11"  (1.803 m)    Wt 177 lb 3.2 oz (80.4 kg)    SpO2 96%    BMI 24.71 kg/m   Weight: 177 lb 3.2 oz (80.4 kg)   BP Readings from Last 3 Encounters:  08/11/21 102/62  08/11/21 102/62  03/07/21 (!) 144/82   Wt Readings from Last 3 Encounters:  08/11/21 177 lb (80.3 kg)  08/11/21 177 lb  3.2 oz (80.4 kg)  03/07/21 182 lb 9.6 oz (82.8 kg)    Physical Exam Constitutional:      General: He is not in acute distress.    Appearance: He is well-developed.  Cardiovascular:     Rate and Rhythm: Normal rate and regular rhythm.     Heart sounds: Normal heart sounds. No murmur heard.   No friction rub.  Pulmonary:     Effort: Pulmonary effort is normal. No respiratory distress.     Breath sounds: Normal breath sounds. No wheezing or rales.  Musculoskeletal:     Right lower leg: No edema.     Left lower leg: No edema.  Neurological:     Mental Status: He is alert and oriented to person, place, and time.  Psychiatric:        Behavior: Behavior normal.     Assessment/Plan  1. Essential hypertension Pressure on low end today. Monitor at home and will cut back on losartan if remaining low at home.  - CBC with Differential/Platelet; Future - Comprehensive metabolic panel; Future  2. Atherosclerosis of native coronary artery of native heart without angina pectoris Continue with crestor 40mg  daily.  - Lipid panel; Future  3. Low serum vitamin B12 Recheck bloodwork - Vitamin B12; Future - Homocysteine; Future - Folate; Future - Methylmalonic acid, serum; Future  4. Need for pneumococcal vaccination - Pneumococcal conjugate vaccine 20-valent (Prevnar 20)    Return in about 3 months (around 11/08/2021) for Chronic condition visit.     Micheline Rough, MD

## 2021-08-13 LAB — METHYLMALONIC ACID, SERUM: Methylmalonic Acid, Quant: 351 nmol/L — ABNORMAL HIGH (ref 87–318)

## 2021-08-13 LAB — HOMOCYSTEINE: Homocysteine: 16.2 umol/L — ABNORMAL HIGH (ref <11.4)

## 2021-08-28 ENCOUNTER — Telehealth: Payer: Self-pay | Admitting: Pharmacist

## 2021-08-28 NOTE — Chronic Care Management (AMB) (Signed)
? ? ?  Chronic Care Management ?Pharmacy Assistant  ? ?Name: Willie Lynch  MRN: [id number] DOB: 05-Oct-1933 ? ?Reason for Encounter: Offer Follow up with [name] Clinical Pharmacist ?  ?Recent office visits:  ?08/11/21 [name], LPN - Patient presented for [facility] Annual Wellness Visit. No medication changes. ? ?08/11/21 [name], MD - Patient presented for essential hypertension and other concerns. Changed Losartan Potassium. ? ?Recent consult visits:  ?03/07/21 [name], MD (Neurology) - Patient presented for Late onset Alzheimer's disease without behavioral disturbance. Stopped Asprin, Memantine , Timolol & Travoprost. ? ?Hospital visits:  ?None in previous 6 months ? ?Medications: ?Outpatient Encounter Medications as of 08/28/2021  ?Medication Sig  ? alfuzosin (UROXATRAL) 10 MG 24 hr tablet Take 10 mg by mouth daily.  ? amLODipine (NORVASC) 10 MG tablet TAKE 1 TABLET BY MOUTH EVERY DAY  ? clopidogrel (PLAVIX) 75 MG tablet TAKE 1 TABLET(75 MG) BY MOUTH DAILY  ? donepezil (ARICEPT) 10 MG tablet TAKE 1 TABLET(10 MG) BY MOUTH AT BEDTIME  ? escitalopram (LEXAPRO) 5 MG tablet Take 1 tablet (5 mg total) by mouth daily.  ? famotidine (PEPCID) 20 MG tablet Take 1 tablet (20 mg total) by mouth daily as needed for heartburn or indigestion.  ? finasteride (PROSCAR) 5 MG tablet Take 5 mg by mouth daily.  ? losartan (COZAAR) 50 MG tablet Take 1 tablet (50 mg total) by mouth daily. Take 1 tablet PO BID  ? Multiple Vitamins-Minerals (MULTIVITAL) tablet Take 1 tablet by mouth daily.  ? nebivolol (BYSTOLIC) 5 MG tablet Take 0.5 tablets (2.5 mg total) by mouth daily.  ? nitroGLYCERIN (NITROSTAT) 0.4 MG SL tablet DISSOLVE 1 TABLET UNDER THE TONGUE IF NEEDED AS DIRECTED.  ? Omega-3 Fatty Acids (FISH OIL) 1000 MG CAPS Take 1 capsule by mouth daily.  ? pramipexole (MIRAPEX) 0.5 MG tablet TAKE 2 TABLETS(1 MG) BY MOUTH AT BEDTIME AS NEEDED  ? rosuvastatin (CRESTOR) 40 MG tablet Take 1 tablet (40 mg total) by  mouth daily.  ? ?No facility-administered encounter medications on file as of 08/28/2021.  ?Notes: ?Call to patient to offer follow up with [name] Clinical Pharmacist Patient in agreement and accepted. ? ?Care Gaps: ?COVID Booster - Overdue ?BP- 102/62 ( 08/11/21) ?AWV- 2/23 ?CCM - 6/23 ? ?Star Rating Drugs: ?Losartan 50 mg - Last filled 09/30/20 90 DS ?Rosuvastatin 40 mg - Last filled 03/22/20 90 DS a [facility] ? ?Verified as accurate ? ?[name] CMA ?Clinical Pharmacist Assistant ?[phone] ? ?

## 2021-11-21 ENCOUNTER — Telehealth: Payer: Self-pay | Admitting: Pharmacist

## 2021-11-21 NOTE — Progress Notes (Unsigned)
Chronic Care Management Pharmacy Note  11/24/2021 Name:  Willie Lynch MRN:  [id number] DOB:  [birth year]  Summary: Pt is not taking medications as prescribed BP was low in last office visit and does not check at home  Recommendations/Changes made from today's visit: -Recommended taking medications with food to prevent upsert stomach -Transition to adherence packaging to improve compliance -Recommended at least weekly BP monitoring at home  Plan: BP follow up with improved adherence in 1 month with new PCP or NP   Subjective: Willie Lynch is an 88 y.o. year old male who is a primary patient of Koberlein, Steele Berg, MD.  The CCM team was consulted for assistance with disease management and care coordination needs.    Engaged with patient by telephone for follow up visit in response to provider referral for pharmacy case management and/or care coordination services.   Consent to Services:  The patient was given information about Chronic Care Management services, agreed to services, and gave verbal consent prior to initiation of services.  Please see initial visit note for detailed documentation.   Patient Care Team: [name], MD as PCP - General (Family Medicine) Luberta Mutter, MD as Consulting Physician (Ophthalmology) Larey Dresser, MD as Consulting Physician (Cardiology) Festus Aloe, MD as Consulting Physician (Urology) Sydnee Cabal, MD as Consulting Physician (Orthopedic Surgery) Earnie Larsson, [facility] as Pharmacist (Pharmacist) [name], MD as Consulting Physician (Neurology)  Recent office visits: 08/11/21 [name], LPN - Patient presented for [facility] Annual Wellness Visit. No medication changes.   08/11/21 [name], MD - Patient presented for essential hypertension and other concerns. Changed Losartan Potassium.  Recent consult visits: 03/07/21 [name], MD (Neurology) - Patient presented for Late onset  Alzheimer's disease without behavioral disturbance. Stopped Asprin, Memantine , Timolol & Travoprost.  Hospital visits: None in previous 6 months   Objective:  Lab Results  Component Value Date   CREATININE 0.82 08/11/2021   BUN 15 08/11/2021   GFR 79.78 08/11/2021   GFRNONAA >60 11/28/2018   GFRAA >60 11/28/2018   NA 138 08/11/2021   K 4.3 08/11/2021   CALCIUM 9.4 08/11/2021   CO2 33 (H) 08/11/2021   GLUCOSE 100 (H) 08/11/2021    Lab Results  Component Value Date/Time   HGBA1C 5.5 07/31/2019 07:30 AM   HGBA1C 5.6 03/30/2018 02:17 PM   GFR 79.78 08/11/2021 09:54 AM   GFR 81.78 10/14/2020 09:15 AM    Last diabetic Eye exam: No results found for: HMDIABEYEEXA  Last diabetic Foot exam: No results found for: HMDIABFOOTEX   Lab Results  Component Value Date   CHOL 151 08/11/2021   HDL 36.00 (L) 08/11/2021   LDLCALC 91 08/11/2021   TRIG 122.0 08/11/2021   CHOLHDL 4 08/11/2021       Latest Ref Rng & Units 08/11/2021    9:54 AM 10/14/2020    9:15 AM 09/24/2020    8:14 AM  Hepatic Function  Total Protein 6.0 - 8.3 g/dL 6.7   6.3   6.4    Albumin 3.5 - 5.2 g/dL 4.2   4.0   4.2    AST 0 - 37 U/L 15   15   18     ALT 0 - 53 U/L 9   11   14     Alk Phosphatase 39 - 117 U/L 82   70   70    Total Bilirubin 0.2 - 1.2 mg/dL 1.5   2.4   2.0  Lab Results  Component Value Date/Time   TSH 3.18 08/25/2019 08:47 AM   TSH 3.22 05/21/2016 01:22 PM   FREET4 0.79 09/23/2015 12:44 PM       Latest Ref Rng & Units 08/11/2021    9:54 AM 09/24/2020    8:14 AM 01/12/2020    9:27 AM  CBC  WBC 4.0 - 10.5 K/uL 5.4   4.8   4.5    Hemoglobin 13.0 - 17.0 g/dL 14.8   15.0   13.8    Hematocrit 39.0 - 52.0 % 43.9   43.8   42.6    Platelets 150.0 - 400.0 K/uL 90.0   98.0   97      No results found for: VD25OH  Clinical ASCVD: Yes  The ASCVD Risk score (Arnett DK, et al., 2019) failed to calculate for the following reasons:   The 2019 ASCVD risk score is only valid for ages 34 to 11        08/11/2021    9:34 AM 08/11/2021    9:24 AM 07/31/2020    9:14 AM  Depression screen PHQ 2/9  Decreased Interest 0 0 0  Down, Depressed, Hopeless 0 0 0  PHQ - 2 Score 0 0 0  Altered sleeping 0 0   Tired, decreased energy 0 0   Change in appetite 0 0   Feeling bad or failure about yourself  0 0   Trouble concentrating 0 0   Moving slowly or fidgety/restless 0 0   Suicidal thoughts 0 0   PHQ-9 Score 0 0       Social History   Tobacco Use  Smoking Status Former   Types: Cigarettes   Quit date: 06/22/1961   Years since quitting: 60.4  Smokeless Tobacco Never   BP Readings from Last 3 Encounters:  08/11/21 102/62  08/11/21 102/62  03/07/21 (!) 144/82   Pulse Readings from Last 3 Encounters:  08/11/21 73  08/11/21 73  03/07/21 71   Wt Readings from Last 3 Encounters:  08/11/21 177 lb (80.3 kg)  08/11/21 177 lb 3.2 oz (80.4 kg)  03/07/21 182 lb 9.6 oz (82.8 kg)   BMI Readings from Last 3 Encounters:  08/11/21 24.69 kg/m  08/11/21 24.71 kg/m  03/07/21 25.47 kg/m    Assessment/Interventions: Review of patient past medical history, allergies, medications, health status, including review of consultants reports, laboratory and other test data, was performed as part of comprehensive evaluation and provision of chronic care management services.   SDOH:  (Social Determinants of Health) assessments and interventions performed: Yes  SDOH Screenings   Alcohol Screen: Low Risk    Last Alcohol Screening Score (AUDIT): 0  Depression (PHQ2-9): Low Risk    PHQ-2 Score: 0  Financial Resource Strain: Low Risk    Difficulty of Paying Living Expenses: Not hard at all  Food Insecurity: No Food Insecurity   Worried About Charity fundraiser in the Last Year: Never true   Ran Out of Food in the Last Year: Never true  Housing: Low Risk    Last Housing Risk Score: 0  Physical Activity: Insufficiently Active   Days of Exercise per Week: 3 days   Minutes of Exercise per  Session: 30 min  Social Connections: Moderately Integrated   Frequency of Communication with Friends and Family: More than three times a week   Frequency of Social Gatherings with Friends and Family: More than three times a week   Attends Religious Services: More than 4 times  per year   Active Member of Clubs or Organizations: Yes   Attends Archivist Meetings: More than 4 times per year   Marital Status: Widowed  Stress: No Stress Concern Present   Feeling of Stress : Not at all  Tobacco Use: Medium Risk   Smoking Tobacco Use: Former   Smokeless Tobacco Use: Never   Passive Exposure: Not on file  Transportation Needs: No Transportation Needs   Lack of Transportation (Medical): No   Lack of Transportation (Non-Medical): No    CCM Care Plan  Allergies  Allergen Reactions   Amoxicillin-Pot Clavulanate     REACTION: 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 Sig Documenting Provider Last Dose Status Informant  alfuzosin (UROXATRAL) 10 MG 24 hr tablet [id number]  Take 10 mg by mouth daily. [provider]  Active Self  amLODipine (NORVASC) 10 MG tablet [id number]  TAKE 1 TABLET BY MOUTH EVERY DAY Koberlein, Junell C, MD  Active   clopidogrel (PLAVIX) 75 MG tablet [id number]  TAKE 1 TABLET(75 MG) BY MOUTH DAILY Koberlein, Junell C, MD  Active   donepezil (ARICEPT) 10 MG tablet [id number] No TAKE 1 TABLET(10 MG) BY MOUTH AT BEDTIME  Patient not taking: Reported on 11/24/2021   [name], MD Not Taking Active   escitalopram (LEXAPRO) 5 MG tablet [id number]  Take 1 tablet (5 mg total) by mouth daily. [name], MD  Active   famotidine (PEPCID) 20 MG tablet [id number]  Take 1 tablet (20 mg total) by mouth daily as needed for heartburn or indigestion. [name], MD  Active    finasteride (PROSCAR) 5 MG tablet [id number]  Take 5 mg by mouth daily. [provider]  Active Self  losartan (COZAAR) 50 MG tablet [id number]  Take 1 tablet (50 mg total) by mouth daily. Take 1 tablet PO BID [name], MD  Active   Multiple Vitamins-Minerals (MULTIVITAL) tablet [id number]  Take 1 tablet by mouth daily. [provider]  Active   nebivolol (BYSTOLIC) 5 MG tablet [id number]  Take 0.5 tablets (2.5 mg total) by mouth daily. [name], MD  Active   nitroGLYCERIN (NITROSTAT) 0.4 MG SL tablet [id number]  DISSOLVE 1 TABLET UNDER THE TONGUE IF NEEDED AS DIRECTED. [name], MD  Active   Omega-3 Fatty Acids (FISH OIL) 1000 MG CAPS [id number]  Take 1 capsule by mouth daily. [provider]  Active   pramipexole (MIRAPEX) 0.5 MG tablet [id number]  TAKE 2 TABLETS(1 MG) BY MOUTH AT BEDTIME AS NEEDED Koberlein, Junell C, MD  Active   rosuvastatin (CRESTOR) 40 MG tablet [id number]  Take 1 tablet (40 mg total) by mouth daily. Larey Dresser, MD  Active             Patient Active Problem List   Diagnosis Date Noted   Gallstone 05/21/2016   BPH (benign prostatic hyperplasia) 11/08/2013   GERD (gastroesophageal reflux disease) 07/08/2012   RLS (restless legs syndrome) 07/08/2012   ED (erectile dysfunction) 01/08/2012   Dyspnea 02/18/2011   Chest pain 12/09/2010   ISCHEMIC CARDIOMYOPATHY 08/09/2009   Fatigue 02/05/2009   HYPERCHOLESTEROLEMIA 11/12/2008   Essential hypertension 11/12/2008   CORONARY ATHEROSCLEROSIS NATIVE CORONARY ARTERY 11/12/2008   ALLERGIC URTICARIA 11/24/2007   DIVERTICULOSIS OF COLON 08/23/2007  RENAL CYST 08/23/2007   LOW BACK PAIN, CHRONIC 08/23/2007   ELEVATED PROSTATE SPECIFIC ANTIGEN 08/23/2007   Allergic rhinitis 07/22/2007   IRRITABLE BOWEL SYNDROME 07/22/2007   Osteoarthritis 07/22/2007    Immunization History  Administered Date(s) Administered   Fluad Quad(high Dose 65+) 04/05/2020   Influenza  Split 04/09/2011, 04/07/2013   Influenza Whole 03/21/2008   Influenza, High Dose Seasonal PF 04/09/2014, 04/20/2016, 03/31/2017, 04/21/2018, 04/05/2019   Influenza-Unspecified 03/23/2015   PFIZER(Purple Top)SARS-COV-2 Vaccination 07/14/2019, 08/04/2019, 04/26/2020   PNEUMOCOCCAL CONJUGATE-20 08/11/2021   Pneumococcal Polysaccharide-23 01/12/2020   Tdap 01/13/2020   Spoke with patient's son for the call as patient has trouble with short term memory. Patient's son and brother go in and check in on him a couple times a week. Patient does still drive around locally but that's about it.  Patient's son was unaware that patient hadn't filled most medications in almost 1.5 years with the exception of pramipexole.  Patient's son reports patient complains of stomach bothering him but then it will subside quickly with no changes. Patient's son is unsure if patient is not remembering this correctly or not.    Conditions to be addressed/monitored:  Hypertension, Hyperlipidemia, Coronary Artery Disease, GERD, BPH, and Memory loss  Care Plan : [place]  Updates made by Viona Gilmore, [place] since 11/24/2021 12:00 AM     Problem: Problem: Hypertension, Hyperlipidemia, Coronary Artery Disease, GERD, BPH, and Memory loss      Long-Range Goal: Patient-Specific Goal   Start Date: 11/24/2021  Expected End Date: 11/25/2022  This Visit's Progress: On track  Priority: High  Note:   Current Barriers:  Unable to independently monitor therapeutic efficacy Unable to self administer medications as prescribed  Pharmacist Clinical Goal(s):  Patient will achieve adherence to monitoring guidelines and medication adherence to achieve therapeutic efficacy adhere to prescribed medication regimen as evidenced by use of pill packaging  through collaboration with PharmD and provider.   Interventions: 1:1 collaboration with [name], MD regarding development and update of comprehensive plan of  care as evidenced by provider attestation and co-signature Inter-disciplinary care team collaboration (see longitudinal plan of care) Comprehensive medication review performed; medication list updated in electronic medical record  Hypertension (BP goal <140/90) -Controlled -Current treatment: Losartan 50 mg 1 tablet twice daily - Appropriate, Effective, Safe, Accessible Amlodipine 10 mg 1 tablet daily - Appropriate, Effective, Safe, Accessible Bystolic 5 mg 1/2 tablet daily - Appropriate, Effective, Safe, Accessible -Medications previously tried: n/a -Current home readings: forgets to check but owns BP cuff  -Current dietary habits: did not discuss -Current exercise habits: did not discuss -Denies hypotensive/hypertensive symptoms -Educated on BP goals and benefits of medications for prevention of heart attack, stroke and kidney damage; Importance of home blood pressure monitoring; Proper BP monitoring technique; Symptoms of hypotension and importance of maintaining adequate hydration; -Counseled to monitor BP at home weekly, document, and provide log at future appointments -Counseled on diet and exercise extensively Recommended to continue current medication Recommended set up follow up with new PCP or NP in 1 month for in office BP evaluation.  Hyperlipidemia: (LDL goal < 70) -Uncontrolled -Current treatment: Rosuvastatin 40 mg 1 tablet daily - Appropriate, Query effective, Safe, Accessible -Medications previously tried: n/a  -Current dietary patterns: did not discuss -Current exercise habits: did not discuss -Educated on Cholesterol goals;  Benefits of statin for ASCVD risk reduction; -Counseled on diet and exercise extensively Recommended to continue current medication  Depression/Anxiety (Goal: minimize symptoms) -Controlled -Current treatment:  Escitalopram 5 mg 1 tablet daily - Appropriate, Query effective, Safe, Accessible -Medications previously tried/failed:  n/a -PHQ9: 0 -GAD7: n/a -Educated on Benefits of medication for symptom control -Recommended to continue current medication  Memory loss (Goal: slow rapid progression of memory loss) -Uncontrolled -Current treatment  Donepezil 10 mg 1 tablet at bedtime - not taking -Medications previously tried: memantine (ineffective)  -Recommended restarting.  BPH (Goal: minimize symptoms) -Controlled -Current treatment  Alfuzosin XL 10 mg 1 tablet daily - Appropriate, Effective, Safe, Accessible Finasteride 5 mg 1 tablet daily - Appropriate, Effective, Safe, Accessible -Medications previously tried: none  -Recommended to continue current medication  CAD (Goal: prevent heart events) -Controlled -Current treatment  Nitroglycerin 0.4 mg 1 tablet as needed - Appropriate, Effective, Safe, Accessible Clopidogrel 75 mg 1 tablet daily - Appropriate, Effective, Safe, Accessible -Medications previously tried: aspirin (not needed)  -Recommended to continue current medication  Restless legs syndrome (Goal: minimize symptoms) -Controlled -Current treatment  Pramipexole 0.5 mg 2 tablets at bedtime as needed - Appropriate, Effective, Safe, Accessible -Medications previously tried: n/a  -Recommended to continue current medication   Health Maintenance -Vaccine gaps: shingrix, COVID booster -Current therapy:  Multivitamin 1 tablet daily Fish oil 1000 mg 1 capsule daily -Educated on Cost vs benefit of each product must be carefully weighed by individual consumer -Patient is satisfied with current therapy and denies issues -Recommended to continue current medication  Patient Goals/Self-Care Activities Patient will:  - take medications as prescribed as evidenced by patient report and record review check blood pressure weekly, document, and provide at future appointments  Follow Up Plan: The care management team will reach out to the patient again over the next 30 days.         Medication  Assistance: None required.  Patient affirms current coverage meets needs.  Compliance/Adherence/Medication fill history: Care Gaps: Shingrix, COVID booster BP- 102/62 ( 08/11/21)  Star-Rating Drugs: Losartan 50 mg - Last filled 09/30/20 90 DS Rosuvastatin 40 mg - Last filled 03/22/20 90 DS at [facility]  Patient's preferred pharmacy is:  [id number] [address], [address] [place] [address] Phone: [phone] Fax: [place], [address] [place] [address] Phone: [phone] Fax: [phone]  Uses pill box? No - organized medications based on time Pt endorses 50% compliance  We discussed: Benefits of medication synchronization, packaging and delivery as well as enhanced pharmacist oversight with [facility]. Patient decided to: Utilize [facility] pharmacy for medication synchronization, packaging and delivery  Care Plan and Follow Up Patient Decision:  Patient agrees to Care Plan and Follow-up.  Plan: The care management team will reach out to the patient again over the next 30 days.  [name], PharmD, [place] Pharmacist [place] at [place]

## 2021-11-21 NOTE — Chronic Care Management (AMB) (Signed)
    Chronic Care Management Pharmacy Assistant   Name: Willie Lynch  MRN: [id number] DOB: Dec 10, 1933 11/21/21 APPOINTMENT REMINDER    Willie Lynch pt's son was reminded to have all medications, supplements and any blood glucose and blood pressure readings available for review with [name], Pharm. D, for telephone visit on 11/24/21 at 8:30.    Care Gaps: Zoster Vaccine - Overdue COVID Booster - Overdue Flu Vaccine - Overdue TDAP - Overdue AWV- 2/23 BP- 102/62 ( 08/11/21)   Star Rating Drug: Losartan 50 mg - Last filled 09/30/20 90 DS Rosuvastatin 40 mg - Last filled 03/22/20 90 DS a [facility]  Verified as accurate  Any gaps in medications fill history? Rosuvastatin 40 mg - Last filled 03/22/20 90 DS a [facility]  Verified as accurate      Medications: Outpatient Encounter Medications as of 11/21/2021  Medication Sig   alfuzosin (UROXATRAL) 10 MG 24 hr tablet Take 10 mg by mouth daily.   amLODipine (NORVASC) 10 MG tablet TAKE 1 TABLET BY MOUTH EVERY DAY   clopidogrel (PLAVIX) 75 MG tablet TAKE 1 TABLET(75 MG) BY MOUTH DAILY   donepezil (ARICEPT) 10 MG tablet TAKE 1 TABLET(10 MG) BY MOUTH AT BEDTIME   escitalopram (LEXAPRO) 5 MG tablet Take 1 tablet (5 mg total) by mouth daily.   famotidine (PEPCID) 20 MG tablet Take 1 tablet (20 mg total) by mouth daily as needed for heartburn or indigestion.   finasteride (PROSCAR) 5 MG tablet Take 5 mg by mouth daily.   losartan (COZAAR) 50 MG tablet Take 1 tablet (50 mg total) by mouth daily. Take 1 tablet PO BID   Multiple Vitamins-Minerals (MULTIVITAL) tablet Take 1 tablet by mouth daily.   nebivolol (BYSTOLIC) 5 MG tablet Take 0.5 tablets (2.5 mg total) by mouth daily.   nitroGLYCERIN (NITROSTAT) 0.4 MG SL tablet DISSOLVE 1 TABLET UNDER THE TONGUE IF NEEDED AS DIRECTED.   Omega-3 Fatty Acids (FISH OIL) 1000 MG CAPS Take 1 capsule by mouth daily.   pramipexole (MIRAPEX) 0.5 MG tablet TAKE 2 TABLETS(1 MG) BY MOUTH AT BEDTIME AS NEEDED    rosuvastatin (CRESTOR) 40 MG tablet Take 1 tablet (40 mg total) by mouth daily.   No facility-administered encounter medications on file as of 11/21/2021.       [place] Clinical Pharmacist Assistant [phone]

## 2021-11-24 ENCOUNTER — Ambulatory Visit: Payer: Medicare Other | Admitting: Pharmacist

## 2021-11-24 DIAGNOSIS — E7800 Pure hypercholesterolemia, unspecified: Secondary | ICD-10-CM

## 2021-11-24 DIAGNOSIS — I10 Essential (primary) hypertension: Secondary | ICD-10-CM

## 2021-11-24 NOTE — Patient Instructions (Signed)
Hi Willie Lynch and Willie Lynch,  It was great to get to meet you over the telephone! Below is a summary of some of the topics we discussed.   Please reach out to me if you have any questions or need anything before our follow up!  Best, Maddie  [name], PharmD, [place] at [place]   Visit Information   Goals Addressed   None    Patient Care Plan: CCM Pharmacy Care Plan     Problem Identified: Problem: Hypertension, Hyperlipidemia, Coronary Artery Disease, GERD, BPH, and Memory loss      Long-Range Goal: Patient-Specific Goal   Start Date: 11/24/2021  Expected End Date: 11/25/2022  This Visit's Progress: On track  Priority: High  Note:   Current Barriers:  Unable to independently monitor therapeutic efficacy Unable to self administer medications as prescribed  Pharmacist Clinical Goal(s):  Patient will achieve adherence to monitoring guidelines and medication adherence to achieve therapeutic efficacy adhere to prescribed medication regimen as evidenced by use of pill packaging  through collaboration with PharmD and provider.   Interventions: 1:1 collaboration with [name], MD regarding development and update of comprehensive plan of care as evidenced by provider attestation and co-signature Inter-disciplinary care team collaboration (see longitudinal plan of care) Comprehensive medication review performed; medication list updated in electronic medical record  Hypertension (BP goal <140/90) -Controlled -Current treatment: Losartan 50 mg 1 tablet twice daily - Appropriate, Effective, Safe, Accessible Amlodipine 10 mg 1 tablet daily - Appropriate, Effective, Safe, Accessible Bystolic 5 mg 1/2 tablet daily - Appropriate, Effective, Safe, Accessible -Medications previously tried: n/a -Current home readings: forgets to check but owns BP cuff  -Current dietary habits: did not discuss -Current exercise habits: did not  discuss -Denies hypotensive/hypertensive symptoms -Educated on BP goals and benefits of medications for prevention of heart attack, stroke and kidney damage; Importance of home blood pressure monitoring; Proper BP monitoring technique; Symptoms of hypotension and importance of maintaining adequate hydration; -Counseled to monitor BP at home weekly, document, and provide log at future appointments -Counseled on diet and exercise extensively Recommended to continue current medication Recommended set up follow up with new PCP or NP in 1 month for in office BP evaluation.  Hyperlipidemia: (LDL goal < 70) -Uncontrolled -Current treatment: Rosuvastatin 40 mg 1 tablet daily - Appropriate, Query effective, Safe, Accessible -Medications previously tried: n/a  -Current dietary patterns: did not discuss -Current exercise habits: did not discuss -Educated on Cholesterol goals;  Benefits of statin for ASCVD risk reduction; -Counseled on diet and exercise extensively Recommended to continue current medication  Depression/Anxiety (Goal: minimize symptoms) -Controlled -Current treatment: Escitalopram 5 mg 1 tablet daily - Appropriate, Query effective, Safe, Accessible -Medications previously tried/failed: n/a -PHQ9: 0 -GAD7: n/a -Educated on Benefits of medication for symptom control -Recommended to continue current medication  Memory loss (Goal: slow rapid progression of memory loss) -Uncontrolled -Current treatment  Donepezil 10 mg 1 tablet at bedtime - not taking -Medications previously tried: memantine (ineffective)  -Recommended restarting.  BPH (Goal: minimize symptoms) -Controlled -Current treatment  Alfuzosin XL 10 mg 1 tablet daily - Appropriate, Effective, Safe, Accessible Finasteride 5 mg 1 tablet daily - Appropriate, Effective, Safe, Accessible -Medications previously tried: none  -Recommended to continue current medication  CAD (Goal: prevent heart  events) -Controlled -Current treatment  Nitroglycerin 0.4 mg 1 tablet as needed - Appropriate, Effective, Safe, Accessible Clopidogrel 75 mg 1 tablet daily - Appropriate, Effective, Safe, Accessible -Medications previously tried: aspirin (not needed)  -  Recommended to continue current medication  Restless legs syndrome (Goal: minimize symptoms) -Controlled -Current treatment  Pramipexole 0.5 mg 2 tablets at bedtime as needed - Appropriate, Effective, Safe, Accessible -Medications previously tried: n/a  -Recommended to continue current medication   Health Maintenance -Vaccine gaps: shingrix, COVID booster -Current therapy:  Multivitamin 1 tablet daily Fish oil 1000 mg 1 capsule daily -Educated on Cost vs benefit of each product must be carefully weighed by individual consumer -Patient is satisfied with current therapy and denies issues -Recommended to continue current medication  Patient Goals/Self-Care Activities Patient will:  - take medications as prescribed as evidenced by patient report and record review check blood pressure weekly, document, and provide at future appointments  Follow Up Plan: The care management team will reach out to the patient again over the next 30 days.        Patient verbalizes understanding of instructions and care plan provided today and agrees to view in [place]. Active MyChart status and patient understanding of how to access instructions and care plan via MyChart confirmed with patient.    The pharmacy team will reach out to the patient again over the next 30 days.   Viona Gilmore, [facility]

## 2021-12-02 ENCOUNTER — Other Ambulatory Visit: Payer: Self-pay | Admitting: *Deleted

## 2021-12-02 MED ORDER — PRAMIPEXOLE DIHYDROCHLORIDE 0.5 MG PO TABS: ORAL_TABLET | ORAL | 1 refills | Status: DC

## 2022-03-10 ENCOUNTER — Telehealth: Payer: Self-pay | Admitting: Pharmacist

## 2022-03-10 NOTE — Chronic Care Management (AMB) (Signed)
Chronic Care Management Pharmacy Assistant   Name: Willie Lynch  MRN: [id number] DOB: 12-03-1933  Reason for Encounter: Disease State   Conditions to be addressed/monitored: General Assessment  Recent office visits:  None  Recent consult visits:  None  Hospital visits:  None in previous 6 months  Medications: Outpatient Encounter Medications as of 03/10/2022  Medication Sig   alfuzosin (UROXATRAL) 10 MG 24 hr tablet Take 10 mg by mouth daily.   amLODipine (NORVASC) 10 MG tablet TAKE 1 TABLET BY MOUTH EVERY DAY   clopidogrel (PLAVIX) 75 MG tablet TAKE 1 TABLET(75 MG) BY MOUTH DAILY   donepezil (ARICEPT) 10 MG tablet TAKE 1 TABLET(10 MG) BY MOUTH AT BEDTIME (Patient not taking: Reported on 11/24/2021)   escitalopram (LEXAPRO) 5 MG tablet Take 1 tablet (5 mg total) by mouth daily.   famotidine (PEPCID) 20 MG tablet Take 1 tablet (20 mg total) by mouth daily as needed for heartburn or indigestion.   finasteride (PROSCAR) 5 MG tablet Take 5 mg by mouth daily.   losartan (COZAAR) 50 MG tablet Take 1 tablet (50 mg total) by mouth daily. Take 1 tablet PO BID   Multiple Vitamins-Minerals (MULTIVITAL) tablet Take 1 tablet by mouth daily.   nebivolol (BYSTOLIC) 5 MG tablet Take 0.5 tablets (2.5 mg total) by mouth daily.   nitroGLYCERIN (NITROSTAT) 0.4 MG SL tablet DISSOLVE 1 TABLET UNDER THE TONGUE IF NEEDED AS DIRECTED.   Omega-3 Fatty Acids (FISH OIL) 1000 MG CAPS Take 1 capsule by mouth daily.   pramipexole (MIRAPEX) 0.5 MG tablet TAKE 2 TABLETS(1 MG) BY MOUTH AT BEDTIME AS NEEDED   rosuvastatin (CRESTOR) 40 MG tablet Take 1 tablet (40 mg total) by mouth daily.   No facility-administered encounter medications on file as of 03/10/2022.  Contacted [facility] for General Review Call  Adherence Review:  Does the Clinical Pharmacist Assistant have access to adherence rates? Yes Adherence rates for STAR metric medications  Losartan 50 mg - Last filled 09/30/20 90 DS Losartan 50  mg - Last filled 09/30/20 90 DS Rosuvastatin 40 mg - Last filled 03/22/20 90 DS [facility] Rosuvastatin 40 mg - Last filled 03/22/20 90 DS [facility]  Does the patient have >5 day gap between last estimated fill dates for any of the above medications or other medication gaps? Yes Reason for medication gaps. Per son he reports he has quite a stock pile of medications on hand at home and has been taking so that is why he hasn't filled he reports he can not confirm that he is taking every day as patient has some short term memory loss.   Disease State Questions:  Able to connect with Patient? Yes Spoke to patient's son Did patient have any problems with their health recently? No  Have you had any admissions or emergency room visits or worsening of your condition(s) since last visit? No  Have you had any visits with new specialists or providers since your last visit? No  Have you had any new health care problem(s) since your last visit? No  Have you run out of any of your medications since you last spoke with clinical pharmacist? No  Are there any medications you are not taking as prescribed?  Per son pt has short term memory loss and he is not sure if he has been taking medications every day.  Are you having any issues or side effects with your medications? No  Do you have any other health concerns  or questions you want to discuss with your Clinical Pharmacist before your next visit? No  Are there any health concerns that you feel we can do a better job addressing? No  Are you having any problems with any of the following since the last visit:   None   12. Any falls since last visit? No   13. Any increased or uncontrolled pain since last visit? No   14. Next visit Type: Son to call office to get patient scheduled for visit with Dr Legrand Como, also advised him of flu clinic dates.    Care Gaps: Zoster Vaccine - Overdue COVID Booster - Overdue Flu Vaccine - Overdue CCM- Decl wants  to get in with Provider 1st BP- 102/62 08/11/21 AWV- 2/23   Star Rating Drugs: Losartan 50 mg - Last filled 09/30/20 90 DS Rosuvastatin 40 mg - Last filled 03/22/20 90 DS a [facility] Verified    [place] Clinical Pharmacist Assistant [phone]

## 2022-07-17 ENCOUNTER — Encounter: Payer: Medicare Other | Admitting: Family Medicine

## 2022-08-03 ENCOUNTER — Encounter: Payer: Self-pay | Admitting: Family Medicine

## 2022-08-03 ENCOUNTER — Ambulatory Visit (INDEPENDENT_AMBULATORY_CARE_PROVIDER_SITE_OTHER): Payer: Medicare Other | Admitting: Family Medicine

## 2022-08-03 VITALS — BP 118/80 | HR 75 | Temp 98.1°F | Ht 71.0 in | Wt 173.6 lb

## 2022-08-03 DIAGNOSIS — N138 Other obstructive and reflux uropathy: Secondary | ICD-10-CM | POA: Diagnosis not present

## 2022-08-03 DIAGNOSIS — I10 Essential (primary) hypertension: Secondary | ICD-10-CM

## 2022-08-03 DIAGNOSIS — I2510 Atherosclerotic heart disease of native coronary artery without angina pectoris: Secondary | ICD-10-CM | POA: Diagnosis not present

## 2022-08-03 DIAGNOSIS — C44320 Squamous cell carcinoma of skin of unspecified parts of face: Secondary | ICD-10-CM | POA: Diagnosis not present

## 2022-08-03 DIAGNOSIS — R413 Other amnesia: Secondary | ICD-10-CM

## 2022-08-03 DIAGNOSIS — Z23 Encounter for immunization: Secondary | ICD-10-CM

## 2022-08-03 DIAGNOSIS — G2581 Restless legs syndrome: Secondary | ICD-10-CM | POA: Diagnosis not present

## 2022-08-03 DIAGNOSIS — E7800 Pure hypercholesterolemia, unspecified: Secondary | ICD-10-CM

## 2022-08-03 DIAGNOSIS — N401 Enlarged prostate with lower urinary tract symptoms: Secondary | ICD-10-CM

## 2022-08-03 DIAGNOSIS — E538 Deficiency of other specified B group vitamins: Secondary | ICD-10-CM

## 2022-08-03 LAB — LIPID PANEL
Cholesterol: 155 mg/dL (ref 0–200)
HDL: 41.9 mg/dL (ref >39.00)
LDL Cholesterol: 98 mg/dL (ref 0–99)
NonHDL: 113
Total CHOL/HDL Ratio: 4
Triglycerides: 73 mg/dL (ref 0.0–149.0)
VLDL: 14.6 mg/dL (ref 0.0–40.0)

## 2022-08-03 LAB — CBC
HCT: 45.3 % (ref 39.0–52.0)
Hemoglobin: 15.6 g/dL (ref 13.0–17.0)
MCHC: 34.4 g/dL (ref 30.0–36.0)
MCV: 92.4 fl (ref 78.0–100.0)
Platelets: 146 10*3/uL — ABNORMAL LOW (ref 150.0–400.0)
RBC: 4.9 Mil/uL (ref 4.22–5.81)
RDW: 14.1 % (ref 11.5–15.5)
WBC: 8.3 10*3/uL (ref 4.0–10.5)

## 2022-08-03 LAB — COMPREHENSIVE METABOLIC PANEL
ALT: 8 U/L (ref 0–53)
AST: 12 U/L (ref 0–37)
Albumin: 4 g/dL (ref 3.5–5.2)
Alkaline Phosphatase: 83 U/L (ref 39–117)
BUN: 15 mg/dL (ref 6–23)
CO2: 27 mEq/L (ref 19–32)
Calcium: 9.1 mg/dL (ref 8.4–10.5)
Chloride: 103 mEq/L (ref 96–112)
Creatinine, Ser: 0.82 mg/dL (ref 0.40–1.50)
GFR: 79.23 mL/min (ref >60.00)
Glucose, Bld: 99 mg/dL (ref 70–99)
Potassium: 3.5 mEq/L (ref 3.5–5.1)
Sodium: 138 mEq/L (ref 135–145)
Total Bilirubin: 1.5 mg/dL — ABNORMAL HIGH (ref 0.2–1.2)
Total Protein: 6.2 g/dL (ref 6.0–8.3)

## 2022-08-03 LAB — VITAMIN B12: Vitamin B-12: 160 pg/mL — ABNORMAL LOW (ref 211–911)

## 2022-08-03 MED ORDER — DONEPEZIL HCL 10 MG PO TABS: ORAL_TABLET | ORAL | 1 refills | Status: DC

## 2022-08-03 MED ORDER — FINASTERIDE 5 MG PO TABS: 5.0000 mg | ORAL_TABLET | Freq: Every day | ORAL | 3 refills | Status: DC

## 2022-08-03 MED ORDER — LOSARTAN POTASSIUM 50 MG PO TABS: 50.0000 mg | ORAL_TABLET | Freq: Every day | ORAL | 1 refills | Status: DC

## 2022-08-03 MED ORDER — CYANOCOBALAMIN 500 MCG PO TABS: 500.0000 ug | ORAL_TABLET | Freq: Every day | ORAL | 1 refills | Status: DC

## 2022-08-03 MED ORDER — ROSUVASTATIN CALCIUM 40 MG PO TABS: 40.0000 mg | ORAL_TABLET | Freq: Every day | ORAL | 3 refills | Status: DC

## 2022-08-03 MED ORDER — NEBIVOLOL HCL 5 MG PO TABS: 2.5000 mg | ORAL_TABLET | Freq: Every day | ORAL | 1 refills | Status: DC

## 2022-08-03 MED ORDER — CLOPIDOGREL BISULFATE 75 MG PO TABS: ORAL_TABLET | ORAL | 3 refills | Status: AC

## 2022-08-03 MED ORDER — ESCITALOPRAM OXALATE 5 MG PO TABS: 5.0000 mg | ORAL_TABLET | Freq: Every day | ORAL | 1 refills | Status: DC

## 2022-08-03 MED ORDER — AMLODIPINE BESYLATE 10 MG PO TABS: ORAL_TABLET | ORAL | 1 refills | Status: DC

## 2022-08-03 MED ORDER — PRAMIPEXOLE DIHYDROCHLORIDE 0.5 MG PO TABS: ORAL_TABLET | ORAL | 1 refills | Status: DC
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
## 2022-08-04 NOTE — Assessment & Plan Note (Signed)
Well controlled on mirapex 0.5 mg, 2 tablets at bedtime. Will refill this for patient.

## 2022-08-04 NOTE — Assessment & Plan Note (Signed)
History of in the past, no current chest pain or swelling. Will continue medical management

## 2022-08-04 NOTE — Assessment & Plan Note (Signed)
On crestor 40 mg daily, will get a new lipid panel today and CMP. Continue crestor daily

## 2022-08-04 NOTE — Assessment & Plan Note (Signed)
Current hypertension medications:       Sig   amLODipine (NORVASC) 10 MG tablet TAKE 1 TABLET BY MOUTH EVERY DAY   losartan (COZAAR) 50 MG tablet Take 1 tablet (50 mg total) by mouth daily. Take 1 tablet PO BID   nebivolol (BYSTOLIC) 5 MG tablet Take 0.5 tablets (2.5 mg total) by mouth daily.      BP is well controlled on the above medications, will refill all medications for him.

## 2022-08-18 ENCOUNTER — Telehealth: Payer: Self-pay | Admitting: Family Medicine

## 2022-08-18 NOTE — Telephone Encounter (Signed)
Squamous cell of face, wants to know if it was biopsied and if so requesting the pathology. Have not been able to reach patient to schedule.

## 2022-08-18 NOTE — Telephone Encounter (Signed)
Left a detailed message at the patient's son's cell number [name]) stating our office received a call from [facility] and has not been able to reach anyone for an appt and requested he return my call.

## 2022-08-20 NOTE — Telephone Encounter (Signed)
Spoke with [name] and informed him the dermatology office has not been able to reach anyone for an appt.  [name] stated someone called yesterday while he was in a meeting and he will call today for an appt for the patient.

## 2022-09-21 DIAGNOSIS — L570 Actinic keratosis: Secondary | ICD-10-CM | POA: Diagnosis not present

## 2022-09-21 DIAGNOSIS — L821 Other seborrheic keratosis: Secondary | ICD-10-CM | POA: Diagnosis not present

## 2022-09-21 DIAGNOSIS — D492 Neoplasm of unspecified behavior of bone, soft tissue, and skin: Secondary | ICD-10-CM | POA: Diagnosis not present

## 2022-09-23 ENCOUNTER — Telehealth: Payer: Self-pay | Admitting: Family Medicine

## 2022-09-23 NOTE — Telephone Encounter (Signed)
Called patient to schedule Medicare Annual Wellness Visit (AWV). Left message for patient to call back and schedule Medicare Annual Wellness Visit (AWV).  Last date of AWV: 08/11/21  Please schedule an appointment at any time with [facility] or [facility].  If any questions, please contact me at [phone].  Thank you ,  [name] AWV direct phone # [phone]

## 2022-09-24 ENCOUNTER — Telehealth: Payer: Self-pay | Admitting: Family Medicine

## 2022-09-24 NOTE — Telephone Encounter (Signed)
Contacted Willie Lynch to schedule their annual wellness visit. Appointment made for 10/05/22.  [name] AWV direct phone # [phone]

## 2022-10-05 ENCOUNTER — Telehealth: Payer: Self-pay

## 2022-10-05 VITALS — Ht 70.0 in | Wt 173.0 lb

## 2022-10-05 DIAGNOSIS — Z0000 Encounter for general adult medical examination without abnormal findings: Secondary | ICD-10-CM

## 2022-10-05 NOTE — Telephone Encounter (Signed)
Contacted patient on preferred number listed in notes for scheduled AWV. During visit patient refused to complete visit, disconnected.

## 2022-10-05 NOTE — Progress Notes (Cosign Needed Addendum)
Subjective:   Willie Lynch is a 89 y.o. male who presents for Medicare Annual/Subsequent preventive examination.  Review of Systems    Virtual Visit via Telephone Note  I connected with  Willie Lynch on 10/05/22 at  9:30 AM EDT by telephone and verified that I am speaking with the correct person using two identifiers.  Location: Patient: Home Provider: Office Persons participating in the virtual visit: patient/Nurse Health Advisor   I discussed the limitations, risks, security and privacy concerns of performing an evaluation and management service by telephone and the availability of in person appointments. The patient expressed understanding and agreed to proceed.  Interactive audio and video telecommunications were attempted between this nurse and patient, however failed, due to patient having technical difficulties OR patient did not have access to video capability.  We continued and completed visit with audio only.  Some vital signs may be absent or patient reported.   Tillie Rung, LPN  Cardiac Risk Factors include: advanced age (>52men, >48 women);male gender;hypertension     Objective:    Today's Vitals   10/05/22 0934  Weight: 173 lb (78.5 kg)  Height:  (1.778 m)   Body mass index is 24.82 kg/m.     08/11/2021    9:37 AM 03/07/2021    1:00 PM 07/31/2020    9:12 AM 09/24/2015    4:42 PM  Advanced Directives  Does Patient Have a Medical Advance Directive? Yes Yes No No  Type of Estate agent of [place];Living will Healthcare Power of [place];Living will    Does patient want to make changes to medical advance directive? No - Patient declined     Copy of Healthcare Power of Attorney in Chart? No - copy requested     Would patient like information on creating a medical advance directive?   No - Patient declined No - patient declined information    Current Medications (verified) Outpatient Encounter Medications as of 10/05/2022   Medication Sig   amLODipine (NORVASC) 10 MG tablet TAKE 1 TABLET BY MOUTH EVERY DAY   clopidogrel (PLAVIX) 75 MG tablet TAKE 1 TABLET(75 MG) BY MOUTH DAILY   cyanocobalamin (VITAMIN B12) 500 MCG tablet Take 1 tablet (500 mcg total) by mouth daily.   donepezil (ARICEPT) 10 MG tablet TAKE 1 TABLET(10 MG) BY MOUTH AT BEDTIME   escitalopram (LEXAPRO) 5 MG tablet Take 1 tablet (5 mg total) by mouth daily.   finasteride (PROSCAR) 5 MG tablet Take 1 tablet (5 mg total) by mouth daily.   losartan (COZAAR) 50 MG tablet Take 1 tablet (50 mg total) by mouth daily. Take 1 tablet PO BID   Multiple Vitamins-Minerals (MULTIVITAL) tablet Take 1 tablet by mouth daily.   nebivolol (BYSTOLIC) 5 MG tablet Take 0.5 tablets (2.5 mg total) by mouth daily.   nitroGLYCERIN (NITROSTAT) 0.4 MG SL tablet DISSOLVE 1 TABLET UNDER THE TONGUE IF NEEDED AS DIRECTED.   Omega-3 Fatty Acids (FISH OIL) 1000 MG CAPS Take 1 capsule by mouth daily.   pramipexole (MIRAPEX) 0.5 MG tablet TAKE 2 TABLETS(1 MG) BY MOUTH AT BEDTIME AS NEEDED   rosuvastatin (CRESTOR) 40 MG tablet Take 1 tablet (40 mg total) by mouth daily.   No facility-administered encounter medications on file as of 10/05/2022.    Allergies (verified) Amoxicillin-pot clavulanate, Codeine, and Morphine   History: Past Medical History:  Diagnosis Date   Abdominal pain, unspecified site    Acquired cyst of kidney    Allergic rhinitis, cause  unspecified    CAD (coronary artery disease)    NSTEMI 5/10. Rotational atherectomy/PCI w Xience DES x 3 to RCA and rotation atherectomy /PCI w Xience DES to prox LAD   Diverticulosis of colon (without mention of hemorrhage)    last colon 11/05 by Dr[facility] w divertics only   Elevated prostate specific antigen (PSA)    followed by Dr Vonita Moss for urology   GERD (gastroesophageal reflux disease)    esophagitis   Hyperlipidemia    Hypertension    ACEI cough   Irritable bowel syndrome    Ischemic cardiomyopathy    mild echo  (8/10) w EF 45-50%, diffuse hypokinesis, mild AI and mild MR   Lumbago    Migraine, unspecified, without mention of intractable migraine without mention of status migrainosus    Osteoarthrosis, unspecified whether generalized or localized, unspecified site    Past Surgical History:  Procedure Laterality Date   ANGIOPLASTY     bilat inguinal hernia repairs  7/06   Dr Daphine Deutscher   CARDIAC CATHETERIZATION     CORONARY ANGIOPLASTY     x4   rotator cuff surgery     Dr. Simonne Come   Family History  Problem Relation Age of Onset   Heart disease Father        smoker   Heart attack Father    Cancer Mother    Cancer Other        ?? not sure what kind   Social History   Socioeconomic History   Marital status: Widowed    Spouse name: Hriday Stai)   Number of children: 2   Years of education: Not on file   Highest education level: Not on file  Occupational History   Occupation: Programmer, applications: RETIRED   Occupation: previously worked for [employer]  Tobacco Use   Smoking status: Former    Types: Cigarettes    Quit date: 06/22/1961    Years since quitting: 61.3   Smokeless tobacco: Never  Vaping Use   Vaping Use: Never used  Substance and Sexual Activity   Alcohol use: Yes    Comment: 4 drinks per week    Drug use: No   Sexual activity: Not on file  Other Topics Concern   Not on file  Social History Narrative   Quit smoking 1963. Exercises. Caffeine- 2 cups per day. Previously worked for [employer], now Research scientist (medical). Right handed    Social Determinants of Health   Financial Resource Strain: Low Risk  (10/05/2022)   Overall Financial Resource Strain (CARDIA)    Difficulty of Paying Living Expenses: Not hard at all  Food Insecurity: No Food Insecurity (10/05/2022)   Hunger Vital Sign    Worried About Running Out of Food in the Last Year: Never true    Ran Out of Food in the Last Year: Never true  Transportation Needs: No Transportation Needs (10/05/2022)    PRAPARE - Administrator, Civil Service (Medical): No    Lack of Transportation (Non-Medical): No  Physical Activity: Patient Declined (10/05/2022)   Exercise Vital Sign    Days of Exercise per Week: Patient declined    Minutes of Exercise per Session: Patient declined  Stress: No Stress Concern Present (10/05/2022)   [employer] of Occupational Health - Occupational Stress Questionnaire    Feeling of Stress : Not at all  Social Connections: Moderately Integrated (10/05/2022)   Social Connection and Isolation Panel [NHANES]    Frequency of Communication with Friends  and Family: More than three times a week    Frequency of Social Gatherings with Friends and Family: More than three times a week    Attends Religious Services: More than 4 times per year    Active Member of Clubs or Organizations: Yes    Attends Banker Meetings: More than 4 times per year    Marital Status: Widowed    Tobacco Counseling Counseling given: Not Answered   Clinical Intake:  Pre-visit preparation completed: No  Pain : No/denies pain     BMI - recorded: 24.82 Nutritional Status: BMI of 19-24  Normal Nutritional Risks: None Diabetes: No  How often do you need to have someone help you when you read instructions, pamphlets, or other written materials from your doctor or pharmacy?: 1 - Never  Diabetic?  No  Interpreter Needed?: No  Information entered by :: Theresa Mulligan LPN   Activities of Daily Living    10/05/2022    9:46 AM 10/05/2022    9:39 AM  In your present state of health, do you have any difficulty performing the following activities:  Hearing? 0 0  Vision? 0 0  Difficulty concentrating or making decisions? 0 0  Walking or climbing stairs? 0 0  Dressing or bathing? 0 0  Doing errands, shopping? 0 0  Preparing Food and eating ? N N  Using the Toilet? N N  In the past six months, have you accidently leaked urine? N N  Do you have problems with loss of  bowel control? N N  Managing your Medications? N N  Managing your Finances? N N  Housekeeping or managing your Housekeeping? N N    Patient Care Team: Karie Georges, MD as PCP - General (Family Medicine) Maris Berger, MD as Consulting Physician (Ophthalmology) Laurey Morale, MD as Consulting Physician (Cardiology) Jerilee Field, MD as Consulting Physician (Urology) Eugenia Mcalpine, MD as Consulting Physician (Orthopedic Surgery) Van Clines, MD as Consulting Physician (Neurology) Verner Chol, [facility] (Inactive) as Pharmacist (Pharmacist)  Indicate any recent Medical Services you may have received from other than [facility] providers in the past year (date may be approximate).     Assessment:   This is a routine wellness examination for Willie Lynch.  Hearing/Vision screen Hearing Screening - Comments:: Denies hearing difficulties   Vision Screening - Comments:: Wears rx glasses - up to date with routine eye exams with  [facility]  Dietary issues and exercise activities discussed: Current Exercise Habits: Home exercise routine, Type of exercise: Other - see comments (Plays golf), Time (Minutes): > 60, Exercise limited by: None identified   Goals Addressed               This Visit's Progress     No current goals (pt-stated)        No current goals (pt-stated)         Depression Screen    10/05/2022    9:38 AM 08/03/2022    8:29 AM 08/11/2021    9:34 AM 08/11/2021    9:24 AM 07/31/2020    9:14 AM 04/12/2019   12:02 PM  PHQ 2/9 Scores  PHQ - 2 Score 0 0 0 0 0 0  PHQ- 9 Score  0 0 0      Fall Risk    10/05/2022    9:48 AM 10/05/2022    9:40 AM 08/11/2021    9:37 AM 03/07/2021    1:00 PM 07/31/2020    9:13 AM  Fall  Risk   Falls in the past year? 0 0 0 0 0  Number falls in past yr:  0 0 0 0  Injury with Fall?  0 0 0 0  Risk for fall due to :  No Fall Risks No Fall Risks  No Fall Risks  Follow up  Falls prevention discussed   Falls evaluation completed;Falls  prevention discussed    FALL RISK PREVENTION PERTAINING TO THE HOME:  Any stairs in or around the home?  If so, are there any without handrails?  Home free of loose throw rugs in walkways, pet beds, electrical cords, etc?  Adequate lighting in your home to reduce risk of falls?   ASSISTIVE DEVICES UTILIZED TO PREVENT FALLS:  Life alert?  Use of a cane, walker or w/c?  Grab bars in the bathroom?  Shower chair or bench in shower?  Elevated toilet seat or a handicapped toilet?   TIMED UP AND GO:  Was the test performed? No . Audio Visit   Cognitive Function:      03/07/2021    1:00 PM 03/08/2020    9:00 AM 01/12/2020    8:17 AM 08/27/2019    8:48 AM  Montreal Cognitive Assessment   Visuospatial/ Executive (0/5) Naming (0/3) Attention: Read list of digits (0/2) Attention: Read list of letters (0/1) Attention: Serial 7 subtraction starting at 100 (0/3) Language: Repeat phrase (0/2) Language : Fluency (0/1) 0 0 1 1  Abstraction (0/2) Delayed Recall (0/5) 0 0 0 0  Orientation (0/6) Total Adjusted Score (based on education) 10/05/2022    9:41 AM 08/11/2021    9:38 AM  6CIT Screen  What Year? 0 points 0 points  What month? 3 points 3 points  What time? 0 points 0 points  Count back from 20 0 points 0 points  Months in reverse 0 points 0 points  Repeat phrase  2 points  Total Score  5 points    Immunizations Immunization History  Administered Date(s) Administered   Fluad Quad(high Dose 65+) 04/05/2020, 08/03/2022   Influenza Split 04/09/2011, 04/07/2013   Influenza Whole 03/21/2008   Influenza, High Dose Seasonal PF 04/09/2014, 04/20/2016, 03/31/2017, 04/21/2018, 04/05/2019   Influenza-Unspecified 03/23/2015   PFIZER(Purple Top)SARS-COV-2 Vaccination 07/14/2019, 08/04/2019, 04/26/2020   PNEUMOCOCCAL CONJUGATE-20 08/11/2021   Pneumococcal Polysaccharide-23  01/12/2020   Tdap 01/13/2020    TDAP status: Up to date  Flu Vaccine status: Up to date  Pneumococcal vaccine status: Up to date  Covid-19 vaccine status: Completed vaccines  Qualifies for Shingles Vaccine? Yes   Zostavax completed No   Shingrix Completed?: No.    Education has been provided regarding the importance of this vaccine. Patient has been advised to call insurance company to determine out of pocket expense if they have not yet received this vaccine. Advised may also receive vaccine at local pharmacy or Health Dept. Verbalized acceptance and understanding.  Screening Tests Health Maintenance  Topic Date Due   COVID-19 Vaccine (4 - 2023-24 season) 10/21/2022 (Originally 02/20/2022)   Zoster Vaccines- Shingrix (1 of 2) 11/01/2022 (Originally 07/12/1952)   INFLUENZA VACCINE  01/21/2023   Medicare Annual Wellness (AWV)  10/05/2023   DTaP/Tdap/Td (2 - Td or  Tdap) 01/12/2030   Pneumonia Vaccine 24+ Years old  Completed   HPV VACCINES  Aged Out    Health Maintenance  There are no preventive care reminders to display for this patient.   Colorectal cancer screening: No longer required.   Lung Cancer Screening: (Low Dose CT Chest recommended if Age 5-80 years, 30 pack-year currently smoking OR have quit w/in 15years.) does not qualify.    Additional Screening:  Hepatitis C Screening: does not qualify; Completed  Vision Screening: Recommended annual ophthalmology exams for early detection of glaucoma and other disorders of the eye. Is the patient up to date with their annual eye exam?  Yes  Who is the provider or what is the name of the office in which the patient attends annual eye exams? [facility] If pt is not established with a provider, would they like to be referred to a provider to establish care? No .   Dental Screening: Recommended annual dental exams for proper oral hygiene  Community Resource Referral / Chronic Care Management:  CRR required this visit?  No    CCM required this visit?  No      Plan:     I have personally reviewed and noted the following in the patient's chart:   Medical and social history Use of alcohol, tobacco or illicit drugs  Current medications and supplements including opioid prescriptions. Patient is not currently taking opioid prescriptions. Functional ability and status Nutritional status Physical activity Advanced directives List of other physicians Hospitalizations, surgeries, and ER visits in previous 12 months Vitals Screenings to include cognitive, depression, and falls Referrals and appointments  In addition, I have reviewed and discussed with patient certain preventive protocols, quality metrics, and best practice recommendations. A written personalized care plan for preventive services as well as general preventive health recommendations were provided to patient.     Tillie Rung, LPN   1/61/0960   Nurse Notes:  Patient refused to complete this visit.    This encounter was created in error - please disregard.

## 2022-10-05 NOTE — Patient Instructions (Addendum)
Willie Lynch , Thank you for taking time to come for your Medicare Wellness Visit. I appreciate your ongoing commitment to your health goals. Please review the following plan we discussed and let me know if I can assist you in the future.   These are the goals we discussed:  Goals       No current goals (pt-stated)      No current goals (pt-stated)      Patient Stated      I will continue to go to the gym 3x per week.        This is a list of the screening recommended for you and due dates:  Health Maintenance  Topic Date Due   COVID-19 Vaccine (4 - 2023-24 season) 10/21/2022*   Zoster (Shingles) Vaccine (1 of 2) 11/01/2022*   Flu Shot  01/21/2023   Medicare Annual Wellness Visit  10/05/2023   DTaP/Tdap/Td vaccine (2 - Td or Tdap) 01/12/2030   Pneumonia Vaccine  Completed   HPV Vaccine  Aged Out  *Topic was postponed. The date shown is not the original due date.    Advanced directives:   Conditions/risks identified: None  Next appointment: Follow up in one year for your annual wellness visit.    Preventive Care 53 Years and Older, Male  Preventive care refers to lifestyle choices and visits with your health care provider that can promote health and wellness. What does preventive care include? A yearly physical exam. This is also called an annual well check. Dental exams once or twice a year. Routine eye exams. Ask your health care provider how often you should have your eyes checked. Personal lifestyle choices, including: Daily care of your teeth and gums. Regular physical activity. Eating a healthy diet. Avoiding tobacco and drug use. Limiting alcohol use. Practicing safe sex. Taking low doses of aspirin every day. Taking vitamin and mineral supplements as recommended by your health care provider. What happens during an annual well check? The services and screenings done by your health care provider during your annual well check will depend on your age, overall  health, lifestyle risk factors, and family history of disease. Counseling  Your health care provider may ask you questions about your: Alcohol use. Tobacco use. Drug use. Emotional well-being. Home and relationship well-being. Sexual activity. Eating habits. History of falls. Memory and ability to understand (cognition). Work and work Astronomer. Screening  You may have the following tests or measurements: Height, weight, and BMI. Blood pressure. Lipid and cholesterol levels. These may be checked every 5 years, or more frequently if you are over 61 years old. Skin check. Lung cancer screening. You may have this screening every year starting at age 29 if you have a 30-pack-year history of smoking and currently smoke or have quit within the past 15 years. Fecal occult blood test (FOBT) of the stool. You may have this test every year starting at age 11. Flexible sigmoidoscopy or colonoscopy. You may have a sigmoidoscopy every 5 years or a colonoscopy every 10 years starting at age 41. Prostate cancer screening. Recommendations will vary depending on your family history and other risks. Hepatitis C blood test. Hepatitis B blood test. Sexually transmitted disease (STD) testing. Diabetes screening. This is done by checking your blood sugar (glucose) after you have not eaten for a while (fasting). You may have this done every 1-3 years. Abdominal aortic aneurysm (AAA) screening. You may need this if you are a current or former smoker. Osteoporosis. You may be screened  starting at age 36 if you are at high risk. Talk with your health care provider about your test results, treatment options, and if necessary, the need for more tests. Vaccines  Your health care provider may recommend certain vaccines, such as: Influenza vaccine. This is recommended every year. Tetanus, diphtheria, and acellular pertussis (Tdap, Td) vaccine. You may need a Td booster every 10 years. Zoster vaccine. You may  need this after age 75. Pneumococcal 13-valent conjugate (PCV13) vaccine. One dose is recommended after age 60. Pneumococcal polysaccharide (PPSV23) vaccine. One dose is recommended after age 29. Talk to your health care provider about which screenings and vaccines you need and how often you need them. This information is not intended to replace advice given to you by your health care provider. Make sure you discuss any questions you have with your health care provider. Document Released: 07/05/2015 Document Revised: 02/26/2016 Document Reviewed: 04/09/2015 Elsevier Interactive Patient Education  2017 [place] Prevention in the Home Falls can cause injuries. They can happen to people of all ages. There are many things you can do to make your home safe and to help prevent falls. What can I do on the outside of my home? Regularly fix the edges of walkways and driveways and fix any cracks. Remove anything that might make you trip as you walk through a door, such as a raised step or threshold. Trim any bushes or trees on the path to your home. Use bright outdoor lighting. Clear any walking paths of anything that might make someone trip, such as rocks or tools. Regularly check to see if handrails are loose or broken. Make sure that both sides of any steps have handrails. Any raised decks and porches should have guardrails on the edges. Have any leaves, snow, or ice cleared regularly. Use sand or salt on walking paths during winter. Clean up any spills in your garage right away. This includes oil or grease spills. What can I do in the bathroom? Use night lights. Install grab bars by the toilet and in the tub and shower. Do not use towel bars as grab bars. Use non-skid mats or decals in the tub or shower. If you need to sit down in the shower, use a plastic, non-slip stool. Keep the floor dry. Clean up any water that spills on the floor as soon as it happens. Remove soap buildup in  the tub or shower regularly. Attach bath mats securely with double-sided non-slip rug tape. Do not have throw rugs and other things on the floor that can make you trip. What can I do in the bedroom? Use night lights. Make sure that you have a light by your bed that is easy to reach. Do not use any sheets or blankets that are too big for your bed. They should not hang down onto the floor. Have a firm chair that has side arms. You can use this for support while you get dressed. Do not have throw rugs and other things on the floor that can make you trip. What can I do in the kitchen? Clean up any spills right away. Avoid walking on wet floors. Keep items that you use a lot in easy-to-reach places. If you need to reach something above you, use a strong step stool that has a grab bar. Keep electrical cords out of the way. Do not use floor polish or wax that makes floors slippery. If you must use wax, use non-skid floor wax. Do not have throw  rugs and other things on the floor that can make you trip. What can I do with my stairs? Do not leave any items on the stairs. Make sure that there are handrails on both sides of the stairs and use them. Fix handrails that are broken or loose. Make sure that handrails are as long as the stairways. Check any carpeting to make sure that it is firmly attached to the stairs. Fix any carpet that is loose or worn. Avoid having throw rugs at the top or bottom of the stairs. If you do have throw rugs, attach them to the floor with carpet tape. Make sure that you have a light switch at the top of the stairs and the bottom of the stairs. If you do not have them, ask someone to add them for you. What else can I do to help prevent falls? Wear shoes that: Do not have high heels. Have rubber bottoms. Are comfortable and fit you well. Are closed at the toe. Do not wear sandals. If you use a stepladder: Make sure that it is fully opened. Do not climb a closed  stepladder. Make sure that both sides of the stepladder are locked into place. Ask someone to hold it for you, if possible. Clearly mark and make sure that you can see: Any grab bars or handrails. First and last steps. Where the edge of each step is. Use tools that help you move around (mobility aids) if they are needed. These include: Canes. Walkers. Scooters. Crutches. Turn on the lights when you go into a dark area. Replace any light bulbs as soon as they burn out. Set up your furniture so you have a clear path. Avoid moving your furniture around. If any of your floors are uneven, fix them. If there are any pets around you, be aware of where they are. Review your medicines with your doctor. Some medicines can make you feel dizzy. This can increase your chance of falling. Ask your doctor what other things that you can do to help prevent falls. This information is not intended to replace advice given to you by your health care provider. Make sure you discuss any questions you have with your health care provider. Document Released: 04/04/2009 Document Revised: 11/14/2015 Document Reviewed: 07/13/2014 Elsevier Interactive Patient Education  2017 [employer].

## 2022-12-21 DIAGNOSIS — L814 Other melanin hyperpigmentation: Secondary | ICD-10-CM | POA: Diagnosis not present

## 2022-12-21 DIAGNOSIS — C44622 Squamous cell carcinoma of skin of right upper limb, including shoulder: Secondary | ICD-10-CM | POA: Diagnosis not present

## 2022-12-21 DIAGNOSIS — D492 Neoplasm of unspecified behavior of bone, soft tissue, and skin: Secondary | ICD-10-CM | POA: Diagnosis not present

## 2022-12-21 DIAGNOSIS — D225 Melanocytic nevi of trunk: Secondary | ICD-10-CM | POA: Diagnosis not present

## 2022-12-21 DIAGNOSIS — L570 Actinic keratosis: Secondary | ICD-10-CM | POA: Diagnosis not present

## 2022-12-21 DIAGNOSIS — C44329 Squamous cell carcinoma of skin of other parts of face: Secondary | ICD-10-CM | POA: Diagnosis not present

## 2022-12-21 DIAGNOSIS — L821 Other seborrheic keratosis: Secondary | ICD-10-CM | POA: Diagnosis not present

## 2023-02-01 ENCOUNTER — Ambulatory Visit: Payer: Medicare Other | Admitting: Family Medicine

## 2023-02-01 ENCOUNTER — Encounter: Payer: Self-pay | Admitting: Family Medicine

## 2023-02-01 VITALS — BP 100/62 | HR 65 | Temp 97.6°F | Ht 70.0 in | Wt 164.0 lb

## 2023-02-01 DIAGNOSIS — N138 Other obstructive and reflux uropathy: Secondary | ICD-10-CM | POA: Diagnosis not present

## 2023-02-01 DIAGNOSIS — N401 Enlarged prostate with lower urinary tract symptoms: Secondary | ICD-10-CM

## 2023-02-01 DIAGNOSIS — R413 Other amnesia: Secondary | ICD-10-CM

## 2023-02-01 DIAGNOSIS — I10 Essential (primary) hypertension: Secondary | ICD-10-CM

## 2023-02-01 DIAGNOSIS — E7800 Pure hypercholesterolemia, unspecified: Secondary | ICD-10-CM

## 2023-02-01 DIAGNOSIS — E538 Deficiency of other specified B group vitamins: Secondary | ICD-10-CM

## 2023-02-01 LAB — VITAMIN B12: Vitamin B-12: 134 pg/mL — ABNORMAL LOW (ref 211–911)

## 2023-02-01 MED ORDER — NEBIVOLOL HCL 2.5 MG PO TABS
2.5000 mg | ORAL_TABLET | Freq: Every day | ORAL | 1 refills | Status: AC
Start: 2023-02-01 — End: ?

## 2023-02-01 MED ORDER — DONEPEZIL HCL 10 MG PO TABS
ORAL_TABLET | ORAL | 1 refills | Status: AC
Start: 2023-02-01 — End: ?

## 2023-02-01 MED ORDER — ESCITALOPRAM OXALATE 5 MG PO TABS: 5.0000 mg | ORAL_TABLET | Freq: Every day | ORAL | 1 refills | Status: AC

## 2023-02-01 MED ORDER — ROSUVASTATIN CALCIUM 40 MG PO TABS
40.0000 mg | ORAL_TABLET | Freq: Every day | ORAL | 1 refills | Status: AC
Start: 2023-02-01 — End: ?

## 2023-02-01 MED ORDER — LOSARTAN POTASSIUM 50 MG PO TABS
50.0000 mg | ORAL_TABLET | Freq: Every day | ORAL | Status: DC
Start: 2023-02-01 — End: 2023-02-13

## 2023-02-01 MED ORDER — CYANOCOBALAMIN 500 MCG PO TABS
500.0000 ug | ORAL_TABLET | Freq: Every day | ORAL | 1 refills | Status: AC
Start: 2023-02-01 — End: ?

## 2023-02-01 MED ORDER — FINASTERIDE 5 MG PO TABS
5.0000 mg | ORAL_TABLET | Freq: Every day | ORAL | 1 refills | Status: AC
Start: 2023-02-01 — End: ?

## 2023-02-01 MED ORDER — LOSARTAN POTASSIUM 50 MG PO TABS
50.0000 mg | ORAL_TABLET | Freq: Every day | ORAL | 1 refills | Status: DC
Start: 2023-02-01 — End: 2023-02-01

## 2023-02-01 MED ORDER — AMLODIPINE BESYLATE 5 MG PO TABS
ORAL_TABLET | ORAL | 1 refills | Status: AC
Start: 2023-02-01 — End: ?

## 2023-02-01 NOTE — Progress Notes (Signed)
Established Patient Office Visit  Subjective   Patient ID: Willie Lynch, male    DOB: 12-27-1933  Age: 89 y.o. MRN: [id number]  Chief Complaint  Patient presents with   Medical Management of Chronic Issues    Patient is here for follow up today, he is with his son in the visit. Son is concerned about his low blood pressure today. He also has lost some weight since the last. Patient reports he is eating "ok" still feels hungry at home.   HTN-- BP today is slightly low, patient denies any dizziness or feeling like he is going to pass out. No heart palpitations today.   Son is asking about some of his medications-- states that he is on three meds for his BP. Son states he doesn't think he is taking his medications consistently, we had discussd getting him the pill packs and they are agreeable. I spoke to the pharmacist at [place] to confirm.    Current Outpatient Medications  Medication Instructions   amLODipine (NORVASC) 5 MG tablet TAKE 1 TABLET BY MOUTH EVERY DAY   clopidogrel (PLAVIX) 75 MG tablet TAKE 1 TABLET(75 MG) BY MOUTH DAILY   cyanocobalamin (VITAMIN B12) 500 mcg, Oral, Daily   donepezil (ARICEPT) 10 MG tablet TAKE 1 TABLET(10 MG) BY MOUTH AT BEDTIME   escitalopram (LEXAPRO) 5 mg, Oral, Daily   finasteride (PROSCAR) 5 mg, Oral, Daily,     losartan (COZAAR) 50 mg, Oral, Daily   Multiple Vitamins-Minerals (MULTIVITAL) tablet 1 tablet, Oral, Daily,     nebivolol (BYSTOLIC) 2.5 mg, Oral, Daily   nitroGLYCERIN (NITROSTAT) 0.4 MG SL tablet DISSOLVE 1 TABLET UNDER THE TONGUE IF NEEDED AS DIRECTED.   Omega-3 Fatty Acids (FISH OIL) 1000 MG CAPS 1 capsule, Oral, Daily   rosuvastatin (CRESTOR) 40 mg, Oral, Daily    Patient Active Problem List   Diagnosis Date Noted   Low serum vitamin B12 02/01/2023   Gallstone 05/21/2016   BPH (benign prostatic hyperplasia) 11/08/2013   GERD (gastroesophageal reflux disease) 07/08/2012   RLS (restless legs syndrome) 07/08/2012   ED  (erectile dysfunction) 01/08/2012   Dyspnea 02/18/2011   Chest pain 12/09/2010   ISCHEMIC CARDIOMYOPATHY 08/09/2009   Fatigue 02/05/2009   HYPERCHOLESTEROLEMIA 11/12/2008   Essential hypertension 11/12/2008   CORONARY ATHEROSCLEROSIS NATIVE CORONARY ARTERY 11/12/2008   ALLERGIC URTICARIA 11/24/2007   DIVERTICULOSIS OF COLON 08/23/2007   RENAL CYST 08/23/2007   LOW BACK PAIN, CHRONIC 08/23/2007   ELEVATED PROSTATE SPECIFIC ANTIGEN 08/23/2007   Allergic rhinitis 07/22/2007   IRRITABLE BOWEL SYNDROME 07/22/2007   Osteoarthritis 07/22/2007      Review of Systems  All other systems reviewed and are negative.     Objective:     BP 100/62 (BP Location: Left Arm, Patient Position: Sitting, Cuff Size: Normal)   Pulse 65   Temp 97.6 F (36.4 C) (Oral)   Ht 5\' 10"  (1.778 m)   Wt 164 lb (74.4 kg)   SpO2 98%   BMI 23.53 kg/m    Physical Exam Vitals reviewed.  Constitutional:      Appearance: Normal appearance. He is well-groomed and normal weight.  Eyes:     Conjunctiva/sclera: Conjunctivae normal.  Cardiovascular:     Rate and Rhythm: Normal rate and regular rhythm.     Heart sounds: S1 normal and S2 normal. No murmur heard. Pulmonary:     Effort: Pulmonary effort is normal.     Breath sounds: Normal breath sounds and air entry. No rales.  Abdominal:     General: Bowel sounds are normal.  Musculoskeletal:     Right lower leg: No edema.     Left lower leg: No edema.  Neurological:     General: No focal deficit present.     Mental Status: He is alert and oriented to person, place, and time.     Gait: Gait is intact.  Psychiatric:        Mood and Affect: Mood and affect normal.      No results found for any visits on 02/01/23.  Last metabolic panel Lab Results  Component Value Date   GLUCOSE 99 08/03/2022   NA 138 08/03/2022   K 3.5 08/03/2022   CL 103 08/03/2022   CO2 27 08/03/2022   BUN 15 08/03/2022   CREATININE 0.82 08/03/2022   GFR 79.23 08/03/2022    CALCIUM 9.1 08/03/2022   PROT 6.2 08/03/2022   ALBUMIN 4.0 08/03/2022   BILITOT 1.5 (H) 08/03/2022   ALKPHOS 83 08/03/2022   AST 12 08/03/2022   ALT 8 08/03/2022   ANIONGAP 10 11/28/2018   Last lipids Lab Results  Component Value Date   CHOL 155 08/03/2022   HDL 41.90 08/03/2022   LDLCALC 98 08/03/2022   TRIG 73.0 08/03/2022   CHOLHDL 4 08/03/2022      The ASCVD Risk score (Arnett DK, et al., 2019) failed to calculate for the following reasons:   The 2019 ASCVD risk score is only valid for ages 43 to 75    Assessment & Plan:  Low serum vitamin B12 Assessment & Plan: Pt never started the B12 supplements, I will check a new level today. I am concerned because he is about 8 pounds less than his last visit. I advised the son to buy him the protein shakes and he should be drinking them once daily to help improve caloric intake.   Orders: -     Vitamin B12 -     Cyanocobalamin; Take 1 tablet (500 mcg total) by mouth daily.  Dispense: 90 tablet; Refill: 1  Essential hypertension Assessment & Plan: Current hypertension medications:       Sig   amLODipine (NORVASC) 5 MG tablet TAKE 1 TABLET BY MOUTH EVERY DAY   losartan (COZAAR) 50 MG tablet Take 1 tablet (50 mg total) by mouth daily.   nebivolol (BYSTOLIC) 2.5 MG tablet Take 1 tablet (2.5 mg total) by mouth daily.      BP was repeated in the office and was 140/70,  we discussed that it is unclear what medications we can reduce/stop because we do not know what he is taking at home. I strongly recommended he start the pill packs and I have sent all new prescriptions.   Orders: -     amLODIPine Besylate; TAKE 1 TABLET BY MOUTH EVERY DAY  Dispense: 90 tablet; Refill: 1 -     Nebivolol HCl; Take 1 tablet (2.5 mg total) by mouth daily.  Dispense: 90 tablet; Refill: 1 -     Losartan Potassium; Take 1 tablet (50 mg total) by mouth daily.  Memory impairment -     Donepezil HCl; TAKE 1 TABLET(10 MG) BY MOUTH AT BEDTIME  Dispense:  90 tablet; Refill: 1 -     Escitalopram Oxalate; Take 1 tablet (5 mg total) by mouth daily.  Dispense: 90 tablet; Refill: 1  Benign prostatic hyperplasia with urinary obstruction -     Finasteride; Take 1 tablet (5 mg total) by mouth daily.  Dispense: 90 tablet;  Refill: 1  Pure hypercholesterolemia -     Rosuvastatin Calcium; Take 1 tablet (40 mg total) by mouth daily.  Dispense: 90 tablet; Refill: 1   I spent 30 minutes with the patient and son today discussing his medications, I also spent 10 minutes speaking with the pharmacist to help coordinate the patient's care. Checking new B12 level today for surveillance.   Return in about 6 months (around 08/04/2023) for HTN.    Karie Georges, MD

## 2023-02-01 NOTE — Assessment & Plan Note (Signed)
Current hypertension medications:       Sig   amLODipine (NORVASC) 5 MG tablet TAKE 1 TABLET BY MOUTH EVERY DAY   losartan (COZAAR) 50 MG tablet Take 1 tablet (50 mg total) by mouth daily.   nebivolol (BYSTOLIC) 2.5 MG tablet Take 1 tablet (2.5 mg total) by mouth daily.      BP was repeated in the office and was 140/70,  we discussed that it is unclear what medications we can reduce/stop because we do not know what he is taking at home. I strongly recommended he start the pill packs and I have sent all new prescriptions.

## 2023-02-01 NOTE — Assessment & Plan Note (Signed)
Pt never started the B12 supplements, I will check a new level today. I am concerned because he is about 8 pounds less than his last visit. I advised the son to buy him the protein shakes and he should be drinking them once daily to help improve caloric intake.

## 2023-02-09 ENCOUNTER — Ambulatory Visit (INDEPENDENT_AMBULATORY_CARE_PROVIDER_SITE_OTHER): Payer: Medicare Other | Admitting: Family Medicine

## 2023-02-09 ENCOUNTER — Encounter: Payer: Self-pay | Admitting: Family Medicine

## 2023-02-09 VITALS — BP 130/80 | HR 90 | Temp 97.6°F | Ht 70.0 in | Wt 153.5 lb

## 2023-02-09 DIAGNOSIS — R1032 Left lower quadrant pain: Secondary | ICD-10-CM | POA: Diagnosis not present

## 2023-02-09 DIAGNOSIS — R1114 Bilious vomiting: Secondary | ICD-10-CM

## 2023-02-09 DIAGNOSIS — N139 Obstructive and reflux uropathy, unspecified: Secondary | ICD-10-CM | POA: Diagnosis not present

## 2023-02-09 DIAGNOSIS — R311 Benign essential microscopic hematuria: Secondary | ICD-10-CM

## 2023-02-09 LAB — POCT URINALYSIS DIPSTICK
Bilirubin, UA: NEGATIVE
Glucose, UA: NEGATIVE
Ketones, UA: POSITIVE
Leukocytes, UA: NEGATIVE
Nitrite, UA: NEGATIVE
Protein, UA: POSITIVE — AB
Spec Grav, UA: 1.02 (ref 1.010–1.025)
Urobilinogen, UA: 0.2 E.U./dL
pH, UA: 6 (ref 5.0–8.0)

## 2023-02-09 MED ORDER — ONDANSETRON HCL 4 MG PO TABS: 4.0000 mg | ORAL_TABLET | Freq: Four times a day (QID) | ORAL | 0 refills | Status: DC | PRN

## 2023-02-09 MED ORDER — ONDANSETRON HCL 4 MG/5ML PO SOLN
4.0000 mg | Freq: Once | ORAL | Status: DC
Start: 2023-02-09 — End: 2023-02-13

## 2023-02-09 MED ORDER — ONDANSETRON HCL 4 MG/2ML IJ SOLN
4.0000 mg | Freq: Once | INTRAMUSCULAR | Status: AC
Start: 2023-02-09 — End: 2023-02-09
  Administered 2023-02-09: 4 mg via INTRAMUSCULAR

## 2023-02-09 NOTE — Progress Notes (Signed)
Established Patient Office Visit  Subjective   Patient ID: Willie Lynch, male    DOB: 1933-12-25  Age: 89 y.o. MRN: [id number]  Chief Complaint  Patient presents with   Nausea    X3 days   Emesis    X1 day   Anorexia    Lack of appetite after eating a pepperoni pizza and an adult beverage 4 days ago    3 day history of vomiting, son is present in the visit today and gives most of the history. States that he is having difficulty keeping anything down. States that liquids are ok. No fever or chills, maybe is having some low back pain but it's "not that bad" according to the patient, however the son reports he keeps complaining about the pain. States that the nausea is very bad, son reports that he keeps getting up to go the bathroom several times. No diarrhea, no abdominal pain really. He can only tolerate liquid my mouth, has refused to eat due to the nausea.     Current Outpatient Medications  Medication Instructions   amLODipine (NORVASC) 5 MG tablet TAKE 1 TABLET BY MOUTH EVERY DAY   clopidogrel (PLAVIX) 75 MG tablet TAKE 1 TABLET(75 MG) BY MOUTH DAILY   cyanocobalamin (VITAMIN B12) 500 mcg, Oral, Daily   donepezil (ARICEPT) 10 MG tablet TAKE 1 TABLET(10 MG) BY MOUTH AT BEDTIME   escitalopram (LEXAPRO) 5 mg, Oral, Daily   finasteride (PROSCAR) 5 mg, Oral, Daily,     losartan (COZAAR) 50 mg, Oral, Daily   Multiple Vitamins-Minerals (MULTIVITAL) tablet 1 tablet, Oral, Daily,     nebivolol (BYSTOLIC) 2.5 mg, Oral, Daily   nitroGLYCERIN (NITROSTAT) 0.4 MG SL tablet DISSOLVE 1 TABLET UNDER THE TONGUE IF NEEDED AS DIRECTED.   Omega-3 Fatty Acids (FISH OIL) 1000 MG CAPS 1 capsule, Oral, Daily   ondansetron (ZOFRAN) 4 mg, Oral, Every 6 hours PRN   rosuvastatin (CRESTOR) 40 mg, Oral, Daily      Review of Systems  Constitutional:  Positive for malaise/fatigue. Negative for chills, diaphoresis and fever.  Respiratory:  Negative for shortness of breath.   Cardiovascular:  Negative  for chest pain and leg swelling.  Genitourinary:  Positive for frequency. Negative for flank pain.      Objective:     BP 130/80 (BP Location: Left Arm, Patient Position: Sitting, Cuff Size: Normal)   Pulse 90   Temp 97.6 F (36.4 C) (Oral)   Ht 5\' 10"  (1.778 m)   Wt 153 lb 8 oz (69.6 kg)   SpO2 98%   BMI 22.02 kg/m    Physical Exam Vitals reviewed.  Constitutional:      General: He is in acute distress (secondary to nausea).     Appearance: Normal appearance.  Cardiovascular:     Rate and Rhythm: Regular rhythm.     Pulses: Normal pulses.     Heart sounds: Normal heart sounds.  Pulmonary:     Effort: Pulmonary effort is normal.     Breath sounds: Normal breath sounds. No wheezing or rales.  Abdominal:     General: Abdomen is flat. Bowel sounds are normal. There is no distension.     Palpations: Abdomen is soft.     Tenderness: There is abdominal tenderness (RLQ). There is no right CVA tenderness or left CVA tenderness.  Skin:    General: Skin is warm and dry.  Neurological:     Mental Status: He is alert. Mental status is at baseline.  Psychiatric:  Mood and Affect: Mood normal.      Results for orders placed or performed in visit on 02/09/23  POC Urinalysis Dipstick  Result Value Ref Range   Color, UA yellow    Clarity, UA clear    Glucose, UA Negative Negative   Bilirubin, UA negative    Ketones, UA positive    Spec Grav, UA 1.020 1.010 - 1.025   Blood, UA moderate    pH, UA 6.0 5.0 - 8.0   Protein, UA Positive (A) Negative   Urobilinogen, UA 0.2 0.2 or 1.0 E.U./dL   Nitrite, UA negative    Leukocytes, UA Negative Negative   Appearance     Odor        The ASCVD Risk score (Arnett DK, et al., 2019) failed to calculate for the following reasons:   The 2019 ASCVD risk score is only valid for ages 56 to 41    Assessment & Plan:  Bilious vomiting with nausea Most likely Urologic in origine, UA does not show infection however there is blood  present, likely representing a kidney stone. I advised that we obtain labs and a CT scan of the abd/pelvis to look for the stone. Advised son that if his sx worsened he should take him immediately to the hospital. Gave ondansetron injection in the office today to help with immediate relief.  -     POCT urinalysis dipstick -     Ondansetron HCl -     Ondansetron HCl -     Ondansetron HCl; Take 1 tablet (4 mg total) by mouth every 6 (six) hours as needed for nausea or vomiting.  Dispense: 30 tablet; Refill: 0  Benign essential microscopic hematuria -     Comprehensive metabolic panel -     Urine Culture  Left lower quadrant abdominal pain -     CT RENAL STONE STUDY; Future     No follow-ups on file.    Karie Georges, MD

## 2023-02-10 ENCOUNTER — Encounter (HOSPITAL_COMMUNITY): Payer: Self-pay | Admitting: Family Medicine

## 2023-02-10 ENCOUNTER — Inpatient Hospital Stay (HOSPITAL_COMMUNITY)
Admission: EM | Admit: 2023-02-10 | Discharge: 2023-02-13 | DRG: 726 | Disposition: A | Discharge status: Home / Self Care | Payer: Medicare Other | Attending: Family Medicine | Admitting: Family Medicine

## 2023-02-10 ENCOUNTER — Other Ambulatory Visit: Payer: Self-pay

## 2023-02-10 ENCOUNTER — Ambulatory Visit
Admission: RE | Admit: 2023-02-10 | Discharge: 2023-02-10 | Disposition: A | Discharge status: Home / Self Care | Payer: Medicare Other | Source: Ambulatory Visit | Attending: Family Medicine | Admitting: Family Medicine

## 2023-02-10 DIAGNOSIS — E7800 Pure hypercholesterolemia, unspecified: Secondary | ICD-10-CM | POA: Diagnosis not present

## 2023-02-10 DIAGNOSIS — N179 Acute kidney failure, unspecified: Principal | ICD-10-CM | POA: Diagnosis present

## 2023-02-10 DIAGNOSIS — N1330 Unspecified hydronephrosis: Secondary | ICD-10-CM | POA: Diagnosis present

## 2023-02-10 DIAGNOSIS — Z7902 Long term (current) use of antithrombotics/antiplatelets: Secondary | ICD-10-CM | POA: Diagnosis not present

## 2023-02-10 DIAGNOSIS — Z87891 Personal history of nicotine dependence: Secondary | ICD-10-CM

## 2023-02-10 DIAGNOSIS — I255 Ischemic cardiomyopathy: Secondary | ICD-10-CM | POA: Diagnosis present

## 2023-02-10 DIAGNOSIS — G2581 Restless legs syndrome: Secondary | ICD-10-CM | POA: Diagnosis not present

## 2023-02-10 DIAGNOSIS — F0390 Unspecified dementia without behavioral disturbance: Secondary | ICD-10-CM | POA: Diagnosis present

## 2023-02-10 DIAGNOSIS — R338 Other retention of urine: Secondary | ICD-10-CM | POA: Diagnosis not present

## 2023-02-10 DIAGNOSIS — M549 Dorsalgia, unspecified: Secondary | ICD-10-CM | POA: Diagnosis present

## 2023-02-10 DIAGNOSIS — J309 Allergic rhinitis, unspecified: Secondary | ICD-10-CM | POA: Diagnosis present

## 2023-02-10 DIAGNOSIS — I252 Old myocardial infarction: Secondary | ICD-10-CM | POA: Diagnosis not present

## 2023-02-10 DIAGNOSIS — Z8249 Family history of ischemic heart disease and other diseases of the circulatory system: Secondary | ICD-10-CM | POA: Diagnosis not present

## 2023-02-10 DIAGNOSIS — K219 Gastro-esophageal reflux disease without esophagitis: Secondary | ICD-10-CM | POA: Diagnosis present

## 2023-02-10 DIAGNOSIS — R590 Localized enlarged lymph nodes: Secondary | ICD-10-CM | POA: Diagnosis present

## 2023-02-10 DIAGNOSIS — E876 Hypokalemia: Secondary | ICD-10-CM | POA: Diagnosis present

## 2023-02-10 DIAGNOSIS — R531 Weakness: Secondary | ICD-10-CM | POA: Diagnosis present

## 2023-02-10 DIAGNOSIS — G8929 Other chronic pain: Secondary | ICD-10-CM | POA: Diagnosis not present

## 2023-02-10 DIAGNOSIS — Z955 Presence of coronary angioplasty implant and graft: Secondary | ICD-10-CM | POA: Diagnosis not present

## 2023-02-10 DIAGNOSIS — I2510 Atherosclerotic heart disease of native coronary artery without angina pectoris: Secondary | ICD-10-CM | POA: Diagnosis present

## 2023-02-10 DIAGNOSIS — R339 Retention of urine, unspecified: Principal | ICD-10-CM

## 2023-02-10 DIAGNOSIS — K8020 Calculus of gallbladder without cholecystitis without obstruction: Secondary | ICD-10-CM | POA: Diagnosis not present

## 2023-02-10 DIAGNOSIS — Z881 Allergy status to other antibiotic agents status: Secondary | ICD-10-CM

## 2023-02-10 DIAGNOSIS — Z880 Allergy status to penicillin: Secondary | ICD-10-CM | POA: Diagnosis not present

## 2023-02-10 DIAGNOSIS — N401 Enlarged prostate with lower urinary tract symptoms: Secondary | ICD-10-CM | POA: Diagnosis present

## 2023-02-10 DIAGNOSIS — Z91148 Patient's other noncompliance with medication regimen for other reason: Secondary | ICD-10-CM

## 2023-02-10 DIAGNOSIS — Z885 Allergy status to narcotic agent status: Secondary | ICD-10-CM

## 2023-02-10 DIAGNOSIS — I10 Essential (primary) hypertension: Secondary | ICD-10-CM | POA: Diagnosis not present

## 2023-02-10 DIAGNOSIS — M1990 Unspecified osteoarthritis, unspecified site: Secondary | ICD-10-CM | POA: Diagnosis present

## 2023-02-10 DIAGNOSIS — K5790 Diverticulosis of intestine, part unspecified, without perforation or abscess without bleeding: Secondary | ICD-10-CM | POA: Diagnosis not present

## 2023-02-10 DIAGNOSIS — R3129 Other microscopic hematuria: Secondary | ICD-10-CM | POA: Diagnosis not present

## 2023-02-10 DIAGNOSIS — N400 Enlarged prostate without lower urinary tract symptoms: Secondary | ICD-10-CM | POA: Diagnosis present

## 2023-02-10 DIAGNOSIS — Z79899 Other long term (current) drug therapy: Secondary | ICD-10-CM

## 2023-02-10 DIAGNOSIS — R109 Unspecified abdominal pain: Secondary | ICD-10-CM | POA: Diagnosis not present

## 2023-02-10 DIAGNOSIS — R1032 Left lower quadrant pain: Secondary | ICD-10-CM

## 2023-02-10 LAB — URINALYSIS, ROUTINE W REFLEX MICROSCOPIC
Bilirubin Urine: NEGATIVE
Glucose, UA: NEGATIVE mg/dL
Ketones, ur: NEGATIVE mg/dL
Leukocytes,Ua: NEGATIVE
Nitrite: NEGATIVE
Protein, ur: NEGATIVE mg/dL
RBC / HPF: >50 RBC/hpf (ref 0–5)
Specific Gravity, Urine: 1.014 (ref 1.005–1.030)
pH: 5 (ref 5.0–8.0)

## 2023-02-10 LAB — CBC WITH DIFFERENTIAL/PLATELET
Abs Immature Granulocytes: 0.02 10*3/uL (ref 0.00–0.07)
Basophils Absolute: 0.1 10*3/uL (ref 0.0–0.1)
Basophils Absolute: 0 10*3/uL (ref 0.0–0.1)
Basophils Relative: 0.5 % (ref 0.0–3.0)
Basophils Relative: 0 %
Eosinophils Absolute: 0 10*3/uL (ref 0.0–0.7)
Eosinophils Absolute: 0.1 10*3/uL (ref 0.0–0.5)
Eosinophils Relative: 0.3 % (ref 0.0–5.0)
Eosinophils Relative: 1 %
HCT: 49.3 % (ref 39.0–52.0)
HCT: 45.7 % (ref 39.0–52.0)
Hemoglobin: 16.2 g/dL (ref 13.0–17.0)
Hemoglobin: 15.5 g/dL (ref 13.0–17.0)
Immature Granulocytes: 0 %
Lymphocytes Relative: 10 % — ABNORMAL LOW (ref 12.0–46.0)
Lymphocytes Relative: 14 %
Lymphs Abs: 1.2 10*3/uL (ref 0.7–4.0)
Lymphs Abs: 1.2 10*3/uL (ref 0.7–4.0)
MCH: 31.3 pg (ref 26.0–34.0)
MCHC: 32.9 g/dL (ref 30.0–36.0)
MCHC: 33.9 g/dL (ref 30.0–36.0)
MCV: 94.8 fl (ref 78.0–100.0)
MCV: 92.3 fL (ref 80.0–100.0)
Monocytes Absolute: 1 10*3/uL (ref 0.1–1.0)
Monocytes Absolute: 0.8 10*3/uL (ref 0.1–1.0)
Monocytes Relative: 8.3 % (ref 3.0–12.0)
Monocytes Relative: 9 %
Neutro Abs: 10 10*3/uL — ABNORMAL HIGH (ref 1.4–7.7)
Neutro Abs: 6.4 10*3/uL (ref 1.7–7.7)
Neutrophils Relative %: 80.9 % — ABNORMAL HIGH (ref 43.0–77.0)
Neutrophils Relative %: 76 %
Platelets: 151 10*3/uL (ref 150.0–400.0)
Platelets: 126 10*3/uL — ABNORMAL LOW (ref 150–400)
RBC: 5.2 Mil/uL (ref 4.22–5.81)
RBC: 4.95 MIL/uL (ref 4.22–5.81)
RDW: 15.1 % (ref 11.5–15.5)
RDW: 14.2 % (ref 11.5–15.5)
WBC: 12.3 10*3/uL — ABNORMAL HIGH (ref 4.0–10.5)
WBC: 8.5 10*3/uL (ref 4.0–10.5)
nRBC: 0 % (ref 0.0–0.2)

## 2023-02-10 LAB — CBC
HCT: 39.9 % (ref 39.0–52.0)
Hemoglobin: 13.6 g/dL (ref 13.0–17.0)
MCH: 32 pg (ref 26.0–34.0)
MCHC: 34.1 g/dL (ref 30.0–36.0)
MCV: 93.9 fL (ref 80.0–100.0)
Platelets: 110 10*3/uL — ABNORMAL LOW (ref 150–400)
RBC: 4.25 MIL/uL (ref 4.22–5.81)
RDW: 14.1 % (ref 11.5–15.5)
WBC: 6.9 10*3/uL (ref 4.0–10.5)
nRBC: 0 % (ref 0.0–0.2)

## 2023-02-10 LAB — COMPREHENSIVE METABOLIC PANEL
ALT: 11 U/L (ref 0–53)
ALT: 13 U/L (ref 0–44)
AST: 16 U/L (ref 0–37)
AST: 15 U/L (ref 15–41)
Albumin: 4.3 g/dL (ref 3.5–5.2)
Albumin: 3.8 g/dL (ref 3.5–5.0)
Alkaline Phosphatase: 88 U/L (ref 39–117)
Alkaline Phosphatase: 74 U/L (ref 38–126)
Anion gap: 13 (ref 5–15)
BUN: 28 mg/dL — ABNORMAL HIGH (ref 6–23)
BUN: 37 mg/dL — ABNORMAL HIGH (ref 8–23)
CO2: 26 meq/L (ref 19–32)
CO2: 26 mmol/L (ref 22–32)
Calcium: 10 mg/dL (ref 8.4–10.5)
Calcium: 9.5 mg/dL (ref 8.9–10.3)
Chloride: 98 mEq/L (ref 96–112)
Chloride: 99 mmol/L (ref 98–111)
Creatinine, Ser: 1.53 mg/dL — ABNORMAL HIGH (ref 0.40–1.50)
Creatinine, Ser: 2.15 mg/dL — ABNORMAL HIGH (ref 0.61–1.24)
GFR, Estimated: 29 mL/min — ABNORMAL LOW (ref >60)
GFR: 40.6 mL/min — ABNORMAL LOW (ref >60.00)
Glucose, Bld: 118 mg/dL — ABNORMAL HIGH (ref 70–99)
Glucose, Bld: 116 mg/dL — ABNORMAL HIGH (ref 70–99)
Potassium: 3.2 meq/L — ABNORMAL LOW (ref 3.5–5.1)
Potassium: 3.1 mmol/L — ABNORMAL LOW (ref 3.5–5.1)
Sodium: 139 meq/L (ref 135–145)
Sodium: 138 mmol/L (ref 135–145)
Total Bilirubin: 3.9 mg/dL — ABNORMAL HIGH (ref 0.2–1.2)
Total Bilirubin: 3.6 mg/dL — ABNORMAL HIGH (ref 0.3–1.2)
Total Protein: 7.1 g/dL (ref 6.0–8.3)
Total Protein: 6.6 g/dL (ref 6.5–8.1)

## 2023-02-10 LAB — CREATININE, SERUM
Creatinine, Ser: 1.79 mg/dL — ABNORMAL HIGH (ref 0.61–1.24)
GFR, Estimated: 36 mL/min — ABNORMAL LOW (ref >60)

## 2023-02-10 LAB — URINE CULTURE
MICRO NUMBER:: 15355865
SPECIMEN QUALITY:: ADEQUATE

## 2023-02-10 LAB — MAGNESIUM: Magnesium: 2.5 mg/dL — ABNORMAL HIGH (ref 1.7–2.4)

## 2023-02-10 LAB — LIPASE, BLOOD: Lipase: 23 U/L (ref 11–51)

## 2023-02-10 LAB — CK: Total CK: 36 U/L — ABNORMAL LOW (ref 49–397)

## 2023-02-10 MED ORDER — DONEPEZIL HCL 5 MG PO TABS
5.0000 mg | ORAL_TABLET | Freq: Every day | ORAL | Status: DC
  Administered 2023-02-10 – 2023-02-12 (×3): 5 mg via ORAL
  Filled 2023-02-10 (×3): qty 1

## 2023-02-10 MED ORDER — FINASTERIDE 5 MG PO TABS
5.0000 mg | ORAL_TABLET | Freq: Every day | ORAL | Status: DC
  Administered 2023-02-10 – 2023-02-12 (×3): 5 mg via ORAL
  Filled 2023-02-10 (×3): qty 1

## 2023-02-10 MED ORDER — SODIUM CHLORIDE 0.9 % IV SOLN: INTRAVENOUS | Status: DC

## 2023-02-10 MED ORDER — HEPARIN SODIUM (PORCINE) 5000 UNIT/ML IJ SOLN
5000.0000 [IU] | Freq: Three times a day (TID) | INTRAMUSCULAR | Status: DC
  Administered 2023-02-10 – 2023-02-12 (×7): 5000 [IU] via SUBCUTANEOUS
  Filled 2023-02-10 (×8): qty 1

## 2023-02-10 MED ORDER — ACETAMINOPHEN 650 MG RE SUPP: 650.0000 mg | Freq: Four times a day (QID) | RECTAL | Status: DC | PRN

## 2023-02-10 MED ORDER — METOCLOPRAMIDE HCL 5 MG/ML IJ SOLN
5.0000 mg | Freq: Once | INTRAMUSCULAR | Status: AC
  Administered 2023-02-10: 5 mg via INTRAVENOUS
  Filled 2023-02-10: qty 2

## 2023-02-10 MED ORDER — DOCUSATE SODIUM 100 MG PO CAPS
100.0000 mg | ORAL_CAPSULE | Freq: Two times a day (BID) | ORAL | Status: DC
  Administered 2023-02-10 – 2023-02-13 (×6): 100 mg via ORAL
  Filled 2023-02-10 (×6): qty 1

## 2023-02-10 MED ORDER — NEBIVOLOL HCL 2.5 MG PO TABS
2.5000 mg | ORAL_TABLET | Freq: Every day | ORAL | Status: DC
  Administered 2023-02-10 – 2023-02-12 (×3): 2.5 mg via ORAL
  Filled 2023-02-10 (×3): qty 1

## 2023-02-10 MED ORDER — ACETAMINOPHEN 325 MG PO TABS: 650.0000 mg | ORAL_TABLET | Freq: Four times a day (QID) | ORAL | Status: DC | PRN

## 2023-02-10 MED ORDER — LACTATED RINGERS IV SOLN: INTRAVENOUS | Status: DC

## 2023-02-10 MED ORDER — CLOPIDOGREL BISULFATE 75 MG PO TABS
75.0000 mg | ORAL_TABLET | Freq: Every day | ORAL | Status: DC
  Administered 2023-02-11 – 2023-02-13 (×3): 75 mg via ORAL
  Filled 2023-02-10 (×3): qty 1

## 2023-02-10 MED ORDER — AMLODIPINE BESYLATE 5 MG PO TABS
5.0000 mg | ORAL_TABLET | Freq: Every day | ORAL | Status: DC
  Administered 2023-02-11 – 2023-02-13 (×3): 5 mg via ORAL
  Filled 2023-02-10 (×3): qty 1

## 2023-02-10 MED ORDER — ONDANSETRON HCL 4 MG PO TABS
4.0000 mg | ORAL_TABLET | Freq: Four times a day (QID) | ORAL | Status: DC | PRN
  Administered 2023-02-12: 4 mg via ORAL
  Filled 2023-02-10: qty 1

## 2023-02-10 MED ORDER — LACTATED RINGERS IV BOLUS
1000.0000 mL | Freq: Once | INTRAVENOUS | Status: AC
  Administered 2023-02-10: 1000 mL via INTRAVENOUS

## 2023-02-10 MED ORDER — ONDANSETRON HCL 4 MG/2ML IJ SOLN: 4.0000 mg | Freq: Four times a day (QID) | INTRAMUSCULAR | Status: DC | PRN

## 2023-02-10 MED ORDER — ROSUVASTATIN CALCIUM 20 MG PO TABS
40.0000 mg | ORAL_TABLET | Freq: Every day | ORAL | Status: DC
  Administered 2023-02-11 – 2023-02-13 (×3): 40 mg via ORAL
  Filled 2023-02-10 (×3): qty 2

## 2023-02-10 NOTE — ED Triage Notes (Signed)
Pt sent to ED from pcp after CT exam this morning. Per pt son thye were told bladder was distended and creat levels were elevated. Pt c/o lower back pain

## 2023-02-10 NOTE — ED Provider Notes (Signed)
[place] EMERGENCY DEPARTMENT AT [facility] Provider Note   CSN: [id number] Arrival date & time: 02/10/23  1428     History  Chief Complaint  Patient presents with   Urinary Retention    Willie Lynch is a 89 y.o. male.  HPI Patient presents for concern of urine retention.  He was in his normal state of health 6 days ago.  Over the past 5 days, he has been experiencing fatigue and generalized weakness.  He has had intermittent nausea.  Because of his recent symptoms, he did schedule a PCP appointment yesterday.  At yesterday's appointment, labs were drawn.  Results are notable for AKI.  He underwent a CT scan this morning which showed enlarged bladder concerning for urine retention.  Currently, he denies lower abdominal discomfort.  He has had some back discomfort for the past 2 days.  He was prescribed Zofran and did get a dose 30 minutes prior to arrival.  He does continue to have mild nausea.  He denies any history of urine retention.    Home Medications Prior to Admission medications   Medication Sig Start Date End Date Taking? Authorizing Provider  amLODipine (NORVASC) 5 MG tablet TAKE 1 TABLET BY MOUTH EVERY DAY 02/01/23   Karie Georges, MD  clopidogrel (PLAVIX) 75 MG tablet TAKE 1 TABLET(75 MG) BY MOUTH DAILY 08/03/22   Karie Georges, MD  cyanocobalamin (VITAMIN B12) 500 MCG tablet Take 1 tablet (500 mcg total) by mouth daily. 02/01/23   Karie Georges, MD  donepezil (ARICEPT) 10 MG tablet TAKE 1 TABLET(10 MG) BY MOUTH AT BEDTIME 02/01/23   Karie Georges, MD  escitalopram (LEXAPRO) 5 MG tablet Take 1 tablet (5 mg total) by mouth daily. 02/01/23   Karie Georges, MD  finasteride (PROSCAR) 5 MG tablet Take 1 tablet (5 mg total) by mouth daily. 02/01/23   Karie Georges, MD  losartan (COZAAR) 50 MG tablet Take 1 tablet (50 mg total) by mouth daily. 02/01/23   Karie Georges, MD  Multiple Vitamins-Minerals (MULTIVITAL) tablet Take 1 tablet by  mouth daily.    [provider]  nebivolol (BYSTOLIC) 2.5 MG tablet Take 1 tablet (2.5 mg total) by mouth daily. 02/01/23   Karie Georges, MD  nitroGLYCERIN (NITROSTAT) 0.4 MG SL tablet DISSOLVE 1 TABLET UNDER THE TONGUE IF NEEDED AS DIRECTED. 09/30/20   Wynn Banker, MD  Omega-3 Fatty Acids (FISH OIL) 1000 MG CAPS Take 1 capsule by mouth daily.    [provider]  ondansetron (ZOFRAN) 4 MG tablet Take 1 tablet (4 mg total) by mouth every 6 (six) hours as needed for nausea or vomiting. 02/09/23   Karie Georges, MD  rosuvastatin (CRESTOR) 40 MG tablet Take 1 tablet (40 mg total) by mouth daily. 02/01/23   Karie Georges, MD      Allergies    Amoxicillin-pot clavulanate, Codeine, and Morphine    Review of Systems   Review of Systems  Constitutional:  Positive for fatigue.  Gastrointestinal:  Positive for nausea.  Genitourinary:  Positive for difficulty urinating.  Musculoskeletal:  Positive for back pain.  Neurological:  Positive for weakness (Generalized).  All other systems reviewed and are negative.   Physical Exam Updated Vital Signs BP 113/80 (BP Location: Left Arm)   Pulse 84   Temp 98.1 F (36.7 C) (Oral)   Resp 14   Ht 5\' 11"  (1.803 m)   Wt 72.5 kg   SpO2 97%  BMI 22.29 kg/m  Physical Exam Vitals and nursing note reviewed.  Constitutional:      General: He is not in acute distress.    Appearance: Normal appearance. He is well-developed. He is not ill-appearing, toxic-appearing or diaphoretic.  HENT:     Head: Normocephalic and atraumatic.     Right Ear: External ear normal.     Left Ear: External ear normal.     Nose: Nose normal.     Mouth/Throat:     Mouth: Mucous membranes are moist.  Eyes:     Extraocular Movements: Extraocular movements intact.     Conjunctiva/sclera: Conjunctivae normal.  Cardiovascular:     Rate and Rhythm: Normal rate and regular rhythm.     Heart sounds: No murmur heard. Pulmonary:     Effort:  Pulmonary effort is normal. No respiratory distress.  Abdominal:     General: There is distension.     Palpations: Abdomen is soft.     Tenderness: There is no abdominal tenderness. There is no guarding or rebound.  Musculoskeletal:        General: No swelling. Normal range of motion.     Cervical back: Normal range of motion and neck supple.     Right lower leg: No edema.     Left lower leg: No edema.  Skin:    General: Skin is warm and dry.     Coloration: Skin is not jaundiced or pale.  Neurological:     General: No focal deficit present.     Mental Status: He is alert and oriented to person, place, and time.  Psychiatric:        Mood and Affect: Mood normal.        Behavior: Behavior normal.     ED Results / Procedures / Treatments   Labs (all labs ordered are listed, but only abnormal results are displayed) Labs Reviewed  COMPREHENSIVE METABOLIC PANEL - Abnormal; Notable for the following components:      Result Value   Potassium 3.1 (*)    Glucose, Bld 116 (*)    BUN 37 (*)    Creatinine, Ser 2.15 (*)    Total Bilirubin 3.6 (*)    GFR, Estimated 29 (*)    All other components within normal limits  CBC WITH DIFFERENTIAL/PLATELET - Abnormal; Notable for the following components:   Platelets 126 (*)    All other components within normal limits  URINALYSIS, ROUTINE W REFLEX MICROSCOPIC - Abnormal; Notable for the following components:   APPearance HAZY (*)    Hgb urine dipstick LARGE (*)    Bacteria, UA RARE (*)    All other components within normal limits  MAGNESIUM - Abnormal; Notable for the following components:   Magnesium 2.5 (*)    All other components within normal limits  LIPASE, BLOOD  CK    EKG None  Radiology CT RENAL STONE STUDY  Result Date: 02/10/2023 CLINICAL DATA:  Left flank pain and abdominal pain for 5 days. Nausea. Gross hematuria. EXAM: CT ABDOMEN AND PELVIS WITHOUT CONTRAST TECHNIQUE: Multidetector CT imaging of the abdomen and pelvis  was performed following the standard protocol without IV contrast. RADIATION DOSE REDUCTION: This exam was performed according to the departmental dose-optimization program which includes automated exposure control, adjustment of the mA and/or kV according to patient size and/or use of iterative reconstruction technique. COMPARISON:  07/23/2015 FINDINGS: Lower chest: No acute findings. Hepatobiliary: No mass visualized on this unenhanced exam. Tiny calcified gallstones noted, without signs of  cholecystitis or biliary ductal dilatation. Pancreas: No mass or inflammatory process visualized on this unenhanced exam. Spleen:  Within normal limits in size. Adrenals/Urinary tract: New mild bilateral hydroureteronephrosis is seen to the level of the urinary bladder. No urinary calculi identified. This is likely due to vesicoureteral reflux given markedly distended state of the urinary bladder. Urinary retention cannot be excluded. Stomach/Bowel: No evidence of obstruction, inflammatory process, or abnormal fluid collections. Normal appendix visualized. Diffuse colonic diverticulosis noted, without signs of diverticulitis. Vascular/Lymphatic: New shotty lymphadenopathy is seen throughout the mesentery and abdominal retroperitoneum. New mild bilateral external iliac lymphadenopathy is also seen. No evidence of abdominal aortic aneurysm. Reproductive: Markedly enlarged prostate gland, increased in size since previous study. Other: Mesh again seen from previous left inguinal hernia repair. No evidence of recurrent hernia. Musculoskeletal:  No suspicious bone lesions identified. IMPRESSION: New mild bilateral hydroureteronephrosis, likely due to vesicoureteral reflux given markedly distended state of the urinary bladder. Urinary retention cannot be excluded. Markedly enlarged prostate gland, increased in size since previous study. No evidence of urolithiasis. New shotty lymphadenopathy throughout the abdomen and pelvis,  suspicious for lymphoproliferative disorder, although differential diagnosis also includes metastatic disease. Cholelithiasis. No radiographic evidence of cholecystitis. Colonic diverticulosis, without radiographic evidence of diverticulitis. Electronically Signed   By: Danae Orleans M.D.   On: 02/10/2023 11:58    Procedures BLADDER CATHETERIZATION  Date/Time: 02/10/2023 4:21 PM  Performed by: Gloris Manchester, MD Authorized by: Gloris Manchester, MD   Consent:    Consent obtained:  Verbal   Consent given by:  Patient   Risks, benefits, and alternatives were discussed: yes     Risks discussed:  Pain and incomplete procedure   Alternatives discussed:  No treatment and delayed treatment Universal protocol:    Procedure explained and questions answered to patient or proxy's satisfaction: yes   Pre-procedure details:    Procedure purpose:  Therapeutic   Preparation: Patient was prepped and draped in usual sterile fashion   Anesthesia:    Anesthesia method:  None Procedure details:    Provider performed due to:  Complicated insertion   Catheter insertion:  Indwelling   Catheter type:  Foley   Catheter size:  16 Fr   Bladder irrigation: no     Number of attempts:  1   Urine characteristics:  Mildly cloudy and yellow Post-procedure details:    Procedure completion:  Tolerated well, no immediate complications     Medications Ordered in ED Medications  metoCLOPramide (REGLAN) injection 5 mg (5 mg Intravenous Given 02/10/23 1646)  lactated ringers bolus 1,000 mL (0 mLs Intravenous Stopped 02/10/23 1730)    ED Course/ Medical Decision Making/ A&P                                 Medical Decision Making Amount and/or Complexity of Data Reviewed Labs: ordered.  Risk Prescription drug management.   This patient presents to the ED for concern of urine retention, this involves an extensive number of treatment options, and is a complaint that carries with it a high risk of complications and  morbidity.  The differential diagnosis includes BPH, other urologic obstruction, polypharmacy   Co morbidities that complicate the patient evaluation  CAD, diverticulosis, GERD, BPH, elevated PSA, migraines, IBS, arthritis, HTN, HLD   Additional history obtained:  Additional history obtained from patient's son External records from outside source obtained and reviewed including EMR   Lab Tests:  I Ordered,  and personally interpreted labs.  The pertinent results include: Normal hemoglobin, no leukocytosis.  AKI has worsened since yesterday.  Hypokalemia is present with otherwise normal electrolytes.  Urinalysis shows microscopic hematuria.   Imaging Studies ordered:  I reviewed CT imaging from this morning which showed distended bladder, bilateral hydroureteronephrosis, enlarged prostate, and shotty lymphadenopathy throughout abdomen and pelvis.   Cardiac Monitoring: / EKG:  The patient was maintained on a cardiac monitor.  I personally viewed and interpreted the cardiac monitored which showed an underlying rhythm of: Sinus rhythm  Problem List / ED Course / Critical interventions / Medication management  Patient presenting to the ED after CT imaging this morning showed distended bladder.  Per review of this imaging, bladder volume at time of scan, proximally 5 hours ago, seems to be greater than 1.5 L.  He has been able to urinate but only small amounts at a time.  There is evidence of enlarged prostate on CT imaging.  Currently, he denies lower abdominal discomfort but does endorse some back pain over the past 2 days.  I reviewed his lab work from yesterday which is notable for an AKI.  Patient is overall well-appearing on exam.  Lower abdomen does appear mildly distended.  Tenderness is not present.  Patient to undergo Foley catheter placement.  Will provide IV fluids and repeat lab work from yesterday.  Reglan was ordered for ongoing nausea.  Foley was placed without difficulty.  Lab  work shows worsened AKI since yesterday.  Patient was started on maintenance IV fluid.  He was admitted for further management. I ordered medication including Reglan for nausea; IV fluids for AKI Reevaluation of the patient after these medicines showed that the patient improved I have reviewed the patients home medicines and have made adjustments as needed   Social Determinants of Health:  Has PCP         Final Clinical Impression(s) / ED Diagnoses Final diagnoses:  Urinary retention  AKI (acute kidney injury) [facility])    Rx / DC Orders ED Discharge Orders     None         Gloris Manchester, MD 02/10/23 1732

## 2023-02-10 NOTE — ED Notes (Signed)
ED TO INPATIENT HANDOFF REPORT  Name/Age/Gender Willie Lynch 89 y.o. male  Code Status    Code Status Orders  (From admission, onward)           Start     Ordered   02/10/23 1743  Full code  Continuous       Question:  By:  Answer:  Consent: discussion documented in EHR   02/10/23 1744           Code Status History     Date Active Date Inactive Code Status Order ID Comments User Context   09/23/2015 2010 09/24/2015 1959 Full Code [id number]  Leone Brand, NP Inpatient       Home/SNF/Other Home  Chief Complaint AKI (acute kidney injury) (HCC) [N17.9]  Level of Care/Admitting Diagnosis ED Disposition     ED Disposition  Admit   Condition  --   Comment  Hospital Area: [facility] [[id number]]  Level of Care: Telemetry [5]  Admit to tele based on following criteria: Other see comments  Comments: electrolyte abnormalities.  May admit patient to [facility] or [facility] if equivalent level of care is available:: Yes  Covid Evaluation: Asymptomatic - no recent exposure (last 10 days) testing not required  Diagnosis: AKI (acute kidney injury) [facility]) [[id number]]  Admitting Physician: Chevis Pretty  Attending Physician: Chevis Pretty  Certification:: I certify this patient will need inpatient services for at least 2 midnights  Expected Medical Readiness: 02/13/2023          Medical History Past Medical History:  Diagnosis Date   Abdominal pain, unspecified site    Acquired cyst of kidney    Allergic rhinitis, cause unspecified    CAD (coronary artery disease)    NSTEMI 5/10. Rotational atherectomy/PCI w Xience DES x 3 to RCA and rotation atherectomy /PCI w Xience DES to prox LAD   Diverticulosis of colon (without mention of hemorrhage)    last colon 11/05 by [facility] w divertics only   Elevated prostate specific antigen (PSA)    followed by Dr Vonita Moss for urology   GERD (gastroesophageal reflux disease)     esophagitis   Hyperlipidemia    Hypertension    ACEI cough   Irritable bowel syndrome    Ischemic cardiomyopathy    mild echo (8/10) w EF 45-50%, diffuse hypokinesis, mild AI and mild MR   Lumbago    Migraine, unspecified, without mention of intractable migraine without mention of status migrainosus    Osteoarthrosis, unspecified whether generalized or localized, unspecified site     Allergies Allergies  Allergen Reactions   Amoxicillin-Pot Clavulanate     REACTION: pt developed HIVES on augmentin   Codeine    Morphine     IV Location/Drains/Wounds Patient Lines/Drains/Airways Status     Active Line/Drains/Airways     Name Placement date Placement time Site Days   Peripheral IV 02/10/23 20 G Right Antecubital 02/10/23  1646  Antecubital  less than 1   Urethral Catheter Gloris Manchester MD 02/10/23  1645  --  less than 1            Labs/Imaging Results for orders placed or performed during the hospital encounter of 02/10/23 (from the past 48 hour(s))  Comprehensive metabolic panel     Status: Abnormal   Collection Time: 02/10/23  3:35 PM  Result Value Ref Range   Sodium 138 135 - 145 mmol/L   Potassium 3.1 (L) 3.5 - 5.1 mmol/L   Chloride  99 98 - 111 mmol/L   CO2 26 22 - 32 mmol/L   Glucose, Bld 116 (H) 70 - 99 mg/dL    Comment: Glucose reference range applies only to samples taken after fasting for at least 8 hours.   BUN 37 (H) 8 - 23 mg/dL   Creatinine, Ser 6.21 (H) 0.61 - 1.24 mg/dL   Calcium 9.5 8.9 - 30.8 mg/dL   Total Protein 6.6 6.5 - 8.1 g/dL   Albumin 3.8 3.5 - 5.0 g/dL   AST 15 15 - 41 U/L   ALT 13 0 - 44 U/L   Alkaline Phosphatase 74 38 - 126 U/L   Total Bilirubin 3.6 (H) 0.3 - 1.2 mg/dL   GFR, Estimated 29 (L) >60 mL/min    Comment: (NOTE) Calculated using the CKD-EPI Creatinine Equation (2021)    Anion gap 13 5 - 15    Comment: Performed at [facility], [address], [place], [place] [address]  Lipase, blood     Status: None    Collection Time: 02/10/23  3:35 PM  Result Value Ref Range   Lipase 23 11 - 51 U/L    Comment: Performed at [facility], [address] [place], [place] [address]  CBC with Diff     Status: Abnormal   Collection Time: 02/10/23  3:35 PM  Result Value Ref Range   WBC 8.5 4.0 - 10.5 K/uL   RBC 4.95 4.22 - 5.81 MIL/uL   Hemoglobin 15.5 13.0 - 17.0 g/dL   HCT 52.8 41.3 - 24.4 %   MCV 92.3 80.0 - 100.0 fL   MCH 31.3 26.0 - 34.0 pg   MCHC 33.9 30.0 - 36.0 g/dL   RDW 01.0 27.2 - 53.6 %   Platelets 126 (L) 150 - 400 K/uL    Comment: REPEATED TO VERIFY   nRBC 0.0 0.0 - 0.2 %   Neutrophils Relative % 76 %   Neutro Abs 6.4 1.7 - 7.7 K/uL   Lymphocytes Relative 14 %   Lymphs Abs 1.2 0.7 - 4.0 K/uL   Monocytes Relative 9 %   Monocytes Absolute 0.8 0.1 - 1.0 K/uL   Eosinophils Relative 1 %   Eosinophils Absolute 0.1 0.0 - 0.5 K/uL   Basophils Relative 0 %   Basophils Absolute 0.0 0.0 - 0.1 K/uL   Immature Granulocytes 0 %   Abs Immature Granulocytes 0.02 0.00 - 0.07 K/uL    Comment: Performed at [facility], [address], [place], [place] [address]  Magnesium     Status: Abnormal   Collection Time: 02/10/23  3:35 PM  Result Value Ref Range   Magnesium 2.5 (H) 1.7 - 2.4 mg/dL    Comment: Performed at [facility], [address], [place], [place] [address]  Urinalysis, Routine w reflex microscopic -Urine, Catheterized     Status: Abnormal   Collection Time: 02/10/23  4:21 PM  Result Value Ref Range   Color, Urine YELLOW YELLOW   APPearance HAZY (A) CLEAR   Specific Gravity, Urine 1.014 1.005 - 1.030   pH 5.0 5.0 - 8.0   Glucose, UA NEGATIVE NEGATIVE mg/dL   Hgb urine dipstick LARGE (A) NEGATIVE   Bilirubin Urine NEGATIVE NEGATIVE   Ketones, ur NEGATIVE NEGATIVE mg/dL   Protein, ur NEGATIVE NEGATIVE mg/dL   Nitrite NEGATIVE NEGATIVE   Leukocytes,Ua NEGATIVE NEGATIVE   RBC / HPF >50 0 - 5 RBC/hpf   WBC, UA 6-10 0 - 5  WBC/hpf   Bacteria, UA RARE (A) NONE SEEN   Squamous Epithelial / HPF 0-5 0 - 5 /HPF   Mucus PRESENT    Hyaline Casts, UA PRESENT     Comment: Performed at [facility], [address] [place], [place] [address]   CT RENAL STONE STUDY  Result Date: 02/10/2023 CLINICAL DATA:  Left flank pain and abdominal pain for 5 days. Nausea. Gross hematuria. EXAM: CT ABDOMEN AND PELVIS WITHOUT CONTRAST TECHNIQUE: Multidetector CT imaging of the abdomen and pelvis was performed following the standard protocol without IV contrast. RADIATION DOSE REDUCTION: This exam was performed according to the departmental dose-optimization program which includes automated exposure control, adjustment of the mA and/or kV according to patient size and/or use of iterative reconstruction technique. COMPARISON:  07/23/2015 FINDINGS: Lower chest: No acute findings. Hepatobiliary: No mass visualized on this unenhanced exam. Tiny calcified gallstones noted, without signs of cholecystitis or biliary ductal dilatation. Pancreas: No mass or inflammatory process visualized on this unenhanced exam. Spleen:  Within normal limits in size. Adrenals/Urinary tract: New mild bilateral hydroureteronephrosis is seen to the level of the urinary bladder. No urinary calculi identified. This is likely due to vesicoureteral reflux given markedly distended state of the urinary bladder. Urinary retention cannot be excluded. Stomach/Bowel: No evidence of obstruction, inflammatory process, or abnormal fluid collections. Normal appendix visualized. Diffuse colonic diverticulosis noted, without signs of diverticulitis. Vascular/Lymphatic: New shotty lymphadenopathy is seen throughout the mesentery and abdominal retroperitoneum. New mild bilateral external iliac lymphadenopathy is also seen. No evidence of abdominal aortic aneurysm. Reproductive: Markedly enlarged prostate gland, increased in size since previous study. Other: Mesh again seen from  previous left inguinal hernia repair. No evidence of recurrent hernia. Musculoskeletal:  No suspicious bone lesions identified. IMPRESSION: New mild bilateral hydroureteronephrosis, likely due to vesicoureteral reflux given markedly distended state of the urinary bladder. Urinary retention cannot be excluded. Markedly enlarged prostate gland, increased in size since previous study. No evidence of urolithiasis. New shotty lymphadenopathy throughout the abdomen and pelvis, suspicious for lymphoproliferative disorder, although differential diagnosis also includes metastatic disease. Cholelithiasis. No radiographic evidence of cholecystitis. Colonic diverticulosis, without radiographic evidence of diverticulitis. Electronically Signed   By: Danae Orleans M.D.   On: 02/10/2023 11:58    Pending Labs Unresulted Labs (From admission, onward)     Start     Ordered   02/11/23 0500  Comprehensive metabolic panel  Tomorrow morning,   R        02/10/23 1744   02/11/23 0500  CBC  Tomorrow morning,   R        02/10/23 1744   02/11/23 0500  Magnesium  Tomorrow morning,   R        02/10/23 1744   02/11/23 0500  Phosphorus  Tomorrow morning,   R        02/10/23 1744   02/10/23 1742  CBC  (heparin)  Once,   R       Comments: Baseline for heparin therapy IF NOT ALREADY DRAWN.  Notify MD if PLT < 100 K.    02/10/23 1744   02/10/23 1742  Creatinine, serum  (heparin)  Once,   R       Comments: Baseline for heparin therapy IF NOT ALREADY DRAWN.    02/10/23 1744   02/10/23 1729  CK  Once,   URGENT        02/10/23 1728            Vitals/Pain Today's Vitals  02/10/23 1437 02/10/23 1441 02/10/23 1442 02/10/23 1700  BP: 113/80   (!) 144/77  Pulse: 84   76  Resp: 14   10  Temp: 98.1 F (36.7 C)   98.5 F (36.9 C)  TempSrc: Oral   Oral  SpO2: 97%   97%  Weight: 72.6 kg  72.5 kg   Height: 5\' 11"  (1.803 m)  5\' 11"  (1.803 m)   PainSc:  6       Isolation Precautions No active  isolations  Medications Medications  lactated ringers infusion ( Intravenous New Bag/Given 02/10/23 1809)  amLODipine (NORVASC) tablet 5 mg (has no administration in time range)  nebivolol (BYSTOLIC) tablet 2.5 mg (has no administration in time range)  rosuvastatin (CRESTOR) tablet 40 mg (has no administration in time range)  donepezil (ARICEPT) tablet 5 mg (has no administration in time range)  finasteride (PROSCAR) tablet 5 mg (has no administration in time range)  clopidogrel (PLAVIX) tablet 75 mg (has no administration in time range)  heparin injection 5,000 Units (has no administration in time range)  0.9 %  sodium chloride infusion (has no administration in time range)  acetaminophen (TYLENOL) tablet 650 mg (has no administration in time range)    Or  acetaminophen (TYLENOL) suppository 650 mg (has no administration in time range)  docusate sodium (COLACE) capsule 100 mg (has no administration in time range)  ondansetron (ZOFRAN) tablet 4 mg (has no administration in time range)    Or  ondansetron (ZOFRAN) injection 4 mg (has no administration in time range)  metoCLOPramide (REGLAN) injection 5 mg (5 mg Intravenous Given 02/10/23 1646)  lactated ringers bolus 1,000 mL (0 mLs Intravenous Stopped 02/10/23 1730)    Mobility walks

## 2023-02-10 NOTE — H&P (Signed)
History and Physical    Willie Lynch ZOX:[id number] DOB: 1933-10-25 DOA: 02/10/2023  PCP: Karie Georges, MD   Patient coming from: Home  I have personally briefly reviewed patient's old medical records in [facility]  Chief Complaint: generalized weakness, fatigue for 5 days.  HPI: Willie Lynch is 89 y.o. male with PMH significant for dementia, essential hypertension, hyperlipidemia, CAD with stents, BPH, GERD, osteoarthritis, chronic back pain presented in the ED with complaints of generalized weakness and fatigue for last 5 days.  Patient reports having worsening urinary symptoms.  He reports frequency, hesitancy and burning urination.  Patient also describes having left flank pain associated with nausea and throwing up.  Patient went to see his primary care physician yesterday where all the labs were done.  Outpatient CT renal study was done which shows bilateral hydronephrosis.  Patient was advised to come to the ED.  ED Course: He was hemodynamically stable. Temp 98.5, HR 76, RR 10, BP 113/80, SpO2 97% on room air Labs include sodium 138, potassium 3.1, chloride 99, bicarb 26, glucose 116, BUN 37, creatinine 2.15, calcium 9.5, anion gap 13, magnesium 2.5, alkaline phosphatase 74, albumin 3.8, lipase 23, AST 15, ALT 13, total protein 6.6, total bilirubin 3.6, WBC 8.5, hemoglobin 15.5, hematocrit 45.5, MCV 92.3, platelet 126, UA shows large hemoglobin otherwise unremarkable.  CT renal study: New mild bilateral hydroureteronephrosis, likely due to vesicoureteral reflux given markedly distended state of the urinary bladder. Urinary retention cannot be excluded. Markedly enlarged prostate gland, increased in size since previous study. No evidence of urolithiasis. New shotty lymphadenopathy throughout the abdomen and pelvis, suspicious for lymphoproliferative disorder, although differential diagnosis also includes metastatic disease. Cholelithiasis. No radiographic evidence of  cholecystitis. Colonic diverticulosis, without radiographic evidence of diverticulitis.   Review of Systems: Review of Systems  Constitutional:  Positive for malaise/fatigue.  HENT: Negative.    Eyes: Negative.   Respiratory: Negative.    Cardiovascular: Negative.   Gastrointestinal:  Positive for abdominal pain, nausea and vomiting.  Genitourinary:  Positive for dysuria, flank pain, frequency and urgency.  Skin: Negative.   Neurological: Negative.   Endo/Heme/Allergies: Negative.   Psychiatric/Behavioral: Negative.      Past Medical History:  Diagnosis Date   Abdominal pain, unspecified site    Acquired cyst of kidney    Allergic rhinitis, cause unspecified    CAD (coronary artery disease)    NSTEMI 5/10. Rotational atherectomy/PCI w Xience DES x 3 to RCA and rotation atherectomy /PCI w Xience DES to prox LAD   Diverticulosis of colon (without mention of hemorrhage)    last colon 11/05 by DrSamLeB w divertics only   Elevated prostate specific antigen (PSA)    followed by Dr Vonita Moss for urology   GERD (gastroesophageal reflux disease)    esophagitis   Hyperlipidemia    Hypertension    ACEI cough   Irritable bowel syndrome    Ischemic cardiomyopathy    mild echo (8/10) w EF 45-50%, diffuse hypokinesis, mild AI and mild MR   Lumbago    Migraine, unspecified, without mention of intractable migraine without mention of status migrainosus    Osteoarthrosis, unspecified whether generalized or localized, unspecified site     Past Surgical History:  Procedure Laterality Date   ANGIOPLASTY     bilat inguinal hernia repairs  7/06   Dr Daphine Deutscher   CARDIAC CATHETERIZATION     CORONARY ANGIOPLASTY     x4   rotator cuff surgery     Dr.  Aplington     reports that he quit smoking about 61 years ago. His smoking use included cigarettes. He has never used smokeless tobacco. He reports current alcohol use. He reports that he does not use drugs.  Allergies  Allergen Reactions    Amoxicillin-Pot Clavulanate     REACTION: pt developed HIVES on augmentin   Codeine    Morphine     Family History  Problem Relation Age of Onset   Heart disease Father        smoker   Heart attack Father    Cancer Mother    Cancer Other        ?? not sure what kind    Family history reviewed and not pertinent.  Prior to Admission medications   Medication Sig Start Date End Date Taking? Authorizing Provider  amLODipine (NORVASC) 5 MG tablet TAKE 1 TABLET BY MOUTH EVERY DAY 02/01/23   Karie Georges, MD  clopidogrel (PLAVIX) 75 MG tablet TAKE 1 TABLET(75 MG) BY MOUTH DAILY 08/03/22   Karie Georges, MD  cyanocobalamin (VITAMIN B12) 500 MCG tablet Take 1 tablet (500 mcg total) by mouth daily. 02/01/23   Karie Georges, MD  donepezil (ARICEPT) 10 MG tablet TAKE 1 TABLET(10 MG) BY MOUTH AT BEDTIME 02/01/23   Karie Georges, MD  escitalopram (LEXAPRO) 5 MG tablet Take 1 tablet (5 mg total) by mouth daily. 02/01/23   Karie Georges, MD  finasteride (PROSCAR) 5 MG tablet Take 1 tablet (5 mg total) by mouth daily. 02/01/23   Karie Georges, MD  losartan (COZAAR) 50 MG tablet Take 1 tablet (50 mg total) by mouth daily. 02/01/23   Karie Georges, MD  Multiple Vitamins-Minerals (MULTIVITAL) tablet Take 1 tablet by mouth daily.    [provider]  nebivolol (BYSTOLIC) 2.5 MG tablet Take 1 tablet (2.5 mg total) by mouth daily. 02/01/23   Karie Georges, MD  nitroGLYCERIN (NITROSTAT) 0.4 MG SL tablet DISSOLVE 1 TABLET UNDER THE TONGUE IF NEEDED AS DIRECTED. 09/30/20   Wynn Banker, MD  Omega-3 Fatty Acids (FISH OIL) 1000 MG CAPS Take 1 capsule by mouth daily.    [provider]  ondansetron (ZOFRAN) 4 MG tablet Take 1 tablet (4 mg total) by mouth every 6 (six) hours as needed for nausea or vomiting. 02/09/23   Karie Georges, MD  rosuvastatin (CRESTOR) 40 MG tablet Take 1 tablet (40 mg total) by mouth daily. 02/01/23   Karie Georges, MD     Physical Exam: Vitals:   02/10/23 1437 02/10/23 1442 02/10/23 1700  BP: 113/80  (!) 144/77  Pulse: 84  76  Resp: 14  10  Temp: 98.1 F (36.7 C)  98.5 F (36.9 C)  TempSrc: Oral  Oral  SpO2: 97%  97%  Weight: 72.6 kg 72.5 kg   Height: 5\' 11"  (1.803 m) 5\' 11"  (1.803 m)     Constitutional: Comfortable, not in any acute distress.  Deconditioned Vitals:   02/10/23 1437 02/10/23 1442 02/10/23 1700  BP: 113/80  (!) 144/77  Pulse: 84  76  Resp: 14  10  Temp: 98.1 F (36.7 C)  98.5 F (36.9 C)  TempSrc: Oral  Oral  SpO2: 97%  97%  Weight: 72.6 kg 72.5 kg   Height: 5\' 11"  (1.803 m) 5\' 11"  (1.803 m)    Eyes: PERRL, lids and conjunctivae normal ENMT: Mucous membranes are moist. Posterior pharynx clear of any exudate or lesions.Normal dentition.  Neck: normal,  supple, no masses, no thyromegaly Respiratory: CTA bilaterally.  Normal respiratory effort. No accessory muscle use.  Cardiovascular: S1-S2 heard, regular rate and rhythm, no murmur. Abdomen: Soft,  no tenderness, no masses palpated. No hepatosplenomegaly. Bowel sounds positive.  Musculoskeletal: no clubbing / cyanosis. No joint deformity upper and lower extremities. Good ROM, no contractures. Normal muscle tone.  Skin: no rashes, lesions, ulcers. No induration Neurologic: CN 2-12 grossly intact. Sensation intact, DTR normal. Strength 5/5 in all 4.  Psychiatric: Normal judgment and insight. Alert and oriented x 3. Normal mood.     Labs on Admission: I have personally reviewed following labs and imaging studies  CBC: Recent Labs  Lab 02/09/23 1559 02/10/23 1535  WBC 12.3* 8.5  NEUTROABS 10.0* 6.4  HGB 16.2 15.5  HCT 49.3 45.7  MCV 94.8 92.3  PLT 151.0 126*   Basic Metabolic Panel: Recent Labs  Lab 02/09/23 1559 02/10/23 1535  NA 139 138  K 3.2* 3.1*  CL 98 99  CO2 26 26  GLUCOSE 118* 116*  BUN 28* 37*  CREATININE 1.53* 2.15*  CALCIUM 10.0 9.5  MG  --  2.5*   GFR: Estimated Creatinine Clearance:  24.8 mL/min (A) (by C-G formula based on SCr of 2.15 mg/dL (H)). Liver Function Tests: Recent Labs  Lab 02/09/23 1559 02/10/23 1535  AST 16 15  ALT 11 13  ALKPHOS 88 74  BILITOT 3.9* 3.6*  PROT 7.1 6.6  ALBUMIN 4.3 3.8   Recent Labs  Lab 02/10/23 1535  LIPASE 23   No results for input(s): "AMMONIA" in the last 168 hours. Coagulation Profile: No results for input(s): "INR", "PROTIME" in the last 168 hours. Cardiac Enzymes: No results for input(s): "CKTOTAL", "CKMB", "CKMBINDEX", "TROPONINI" in the last 168 hours. BNP (last 3 results) No results for input(s): "PROBNP" in the last 8760 hours. HbA1C: No results for input(s): "HGBA1C" in the last 72 hours. CBG: No results for input(s): "GLUCAP" in the last 168 hours. Lipid Profile: No results for input(s): "CHOL", "HDL", "LDLCALC", "TRIG", "CHOLHDL", "LDLDIRECT" in the last 72 hours. Thyroid Function Tests: No results for input(s): "TSH", "T4TOTAL", "FREET4", "T3FREE", "THYROIDAB" in the last 72 hours. Anemia Panel: No results for input(s): "VITAMINB12", "FOLATE", "FERRITIN", "TIBC", "IRON", "RETICCTPCT" in the last 72 hours. Urine analysis:    Component Value Date/Time   COLORURINE YELLOW 02/10/2023 1621   APPEARANCEUR HAZY (A) 02/10/2023 1621   LABSPEC 1.014 02/10/2023 1621   PHURINE 5.0 02/10/2023 1621   GLUCOSEU NEGATIVE 02/10/2023 1621   GLUCOSEU NEGATIVE 07/22/2007 1134   HGBUR LARGE (A) 02/10/2023 1621   BILIRUBINUR NEGATIVE 02/10/2023 1621   BILIRUBINUR negative 02/09/2023 1524   KETONESUR NEGATIVE 02/10/2023 1621   PROTEINUR NEGATIVE 02/10/2023 1621   UROBILINOGEN 0.2 02/09/2023 1524   UROBILINOGEN 0.2 mg/dL 23/76/2831 5176   NITRITE NEGATIVE 02/10/2023 1621   LEUKOCYTESUR NEGATIVE 02/10/2023 1621    Radiological Exams on Admission: CT RENAL STONE STUDY  Result Date: 02/10/2023 CLINICAL DATA:  Left flank pain and abdominal pain for 5 days. Nausea. Gross hematuria. EXAM: CT ABDOMEN AND PELVIS WITHOUT  CONTRAST TECHNIQUE: Multidetector CT imaging of the abdomen and pelvis was performed following the standard protocol without IV contrast. RADIATION DOSE REDUCTION: This exam was performed according to the departmental dose-optimization program which includes automated exposure control, adjustment of the mA and/or kV according to patient size and/or use of iterative reconstruction technique. COMPARISON:  07/23/2015 FINDINGS: Lower chest: No acute findings. Hepatobiliary: No mass visualized on this unenhanced exam. Tiny calcified gallstones noted,  without signs of cholecystitis or biliary ductal dilatation. Pancreas: No mass or inflammatory process visualized on this unenhanced exam. Spleen:  Within normal limits in size. Adrenals/Urinary tract: New mild bilateral hydroureteronephrosis is seen to the level of the urinary bladder. No urinary calculi identified. This is likely due to vesicoureteral reflux given markedly distended state of the urinary bladder. Urinary retention cannot be excluded. Stomach/Bowel: No evidence of obstruction, inflammatory process, or abnormal fluid collections. Normal appendix visualized. Diffuse colonic diverticulosis noted, without signs of diverticulitis. Vascular/Lymphatic: New shotty lymphadenopathy is seen throughout the mesentery and abdominal retroperitoneum. New mild bilateral external iliac lymphadenopathy is also seen. No evidence of abdominal aortic aneurysm. Reproductive: Markedly enlarged prostate gland, increased in size since previous study. Other: Mesh again seen from previous left inguinal hernia repair. No evidence of recurrent hernia. Musculoskeletal:  No suspicious bone lesions identified. IMPRESSION: New mild bilateral hydroureteronephrosis, likely due to vesicoureteral reflux given markedly distended state of the urinary bladder. Urinary retention cannot be excluded. Markedly enlarged prostate gland, increased in size since previous study. No evidence of urolithiasis.  New shotty lymphadenopathy throughout the abdomen and pelvis, suspicious for lymphoproliferative disorder, although differential diagnosis also includes metastatic disease. Cholelithiasis. No radiographic evidence of cholecystitis. Colonic diverticulosis, without radiographic evidence of diverticulitis. Electronically Signed   By: Danae Orleans M.D.   On: 02/10/2023 11:58    EKG: Independently reviewed. Pending   Assessment/Plan Principal Problem:   AKI (acute kidney injury) (HCC) Active Problems:   HYPERCHOLESTEROLEMIA   Essential hypertension   CORONARY ATHEROSCLEROSIS NATIVE CORONARY ARTERY   Osteoarthritis   RLS (restless legs syndrome)   BPH (benign prostatic hyperplasia)  Acute kidney injury , Likely post obstructive: Patient with history of BPH presented with hesitancy, frequency, left flank pain. CT renal study showed bilateral mild hydronephrosis. Baseline serum creatinine normal presented with creatinine 2.51. Patient felt improved after catheterization. Denies any fever, no leukocytosis, UA unremarkable. Will hold on antibiotics at this time. Continue IV fluid resuscitation. Avoid nephrotoxic medications. Monitor serum creatinine.  Hypokalemia: Replaced.  Continue to monitor  Essential hypertension: Continue amlodipine, hold losartan in the setting of AKI  Hyperlipidemia; Continue Crestor.  Coronary artery disease: Continue amlodipine and Crestor.  BPH: Continue Proscar.  Dementia: Continue Aricept.   DVT prophylaxis: Heparin sq Code Status: Full code Family Communication: No family at bed side. Disposition Plan:  Status is: Inpatient Remains inpatient appropriate because: Admitted for mild hydronephrosis requiring Foley insertion.  AKI in the setting of enlarged prostate.    Consults called: None Admission status: Inpatient   Willeen Niece MD [facility]   If 7PM-7AM, please contact night-coverage   02/10/2023, 6:20 PM

## 2023-02-11 DIAGNOSIS — N179 Acute kidney failure, unspecified: Secondary | ICD-10-CM | POA: Diagnosis not present

## 2023-02-11 LAB — COMPREHENSIVE METABOLIC PANEL
ALT: 12 U/L (ref 0–44)
AST: 15 U/L (ref 15–41)
Albumin: 2.9 g/dL — ABNORMAL LOW (ref 3.5–5.0)
Alkaline Phosphatase: 57 U/L (ref 38–126)
Anion gap: 6 (ref 5–15)
BUN: 24 mg/dL — ABNORMAL HIGH (ref 8–23)
CO2: 27 mmol/L (ref 22–32)
Calcium: 8.2 mg/dL — ABNORMAL LOW (ref 8.9–10.3)
Chloride: 103 mmol/L (ref 98–111)
Creatinine, Ser: 1.04 mg/dL (ref 0.61–1.24)
GFR, Estimated: >60 mL/min (ref >60)
Glucose, Bld: 97 mg/dL (ref 70–99)
Potassium: 2.9 mmol/L — ABNORMAL LOW (ref 3.5–5.1)
Sodium: 136 mmol/L (ref 135–145)
Total Bilirubin: 2.3 mg/dL — ABNORMAL HIGH (ref 0.3–1.2)
Total Protein: 5.2 g/dL — ABNORMAL LOW (ref 6.5–8.1)

## 2023-02-11 LAB — CBC
HCT: 38.4 % — ABNORMAL LOW (ref 39.0–52.0)
Hemoglobin: 13.1 g/dL (ref 13.0–17.0)
MCH: 31.6 pg (ref 26.0–34.0)
MCHC: 34.1 g/dL (ref 30.0–36.0)
MCV: 92.8 fL (ref 80.0–100.0)
Platelets: 98 10*3/uL — ABNORMAL LOW (ref 150–400)
RBC: 4.14 MIL/uL — ABNORMAL LOW (ref 4.22–5.81)
RDW: 14.1 % (ref 11.5–15.5)
WBC: 6.8 10*3/uL (ref 4.0–10.5)
nRBC: 0 % (ref 0.0–0.2)

## 2023-02-11 LAB — PHOSPHORUS: Phosphorus: 3 mg/dL (ref 2.5–4.6)

## 2023-02-11 LAB — MAGNESIUM: Magnesium: 2.1 mg/dL (ref 1.7–2.4)

## 2023-02-11 MED ORDER — CHLORHEXIDINE GLUCONATE CLOTH 2 % EX PADS
6.0000 | MEDICATED_PAD | Freq: Every day | CUTANEOUS | Status: DC
  Administered 2023-02-11 – 2023-02-12 (×2): 6 via TOPICAL

## 2023-02-11 MED ORDER — ORAL CARE MOUTH RINSE: 15.0000 mL | OROMUCOSAL | Status: DC | PRN

## 2023-02-11 MED ORDER — POTASSIUM CHLORIDE 20 MEQ PO PACK
40.0000 meq | PACK | Freq: Once | ORAL | Status: AC
  Administered 2023-02-11: 40 meq via ORAL
  Filled 2023-02-11: qty 2

## 2023-02-11 NOTE — Progress Notes (Signed)
Pt was admitted to the hospital yesterday, ok to close.

## 2023-02-11 NOTE — Plan of Care (Signed)
  Problem: Elimination: Goal: Will not experience complications related to urinary retention Outcome: Progressing   Problem: Pain Managment: Goal: General experience of comfort will improve Outcome: Progressing   Problem: Safety: Goal: Ability to remain free from injury will improve Outcome: Progressing   

## 2023-02-11 NOTE — Evaluation (Signed)
Occupational Therapy Evaluation Patient Details Name: Willie Lynch MRN: [id number] DOB: 1933/08/18 Today's Date: 02/11/2023   History of Present Illness 89 y.o. male with PMH significant for dementia, essential hypertension, hyperlipidemia, CAD with stents, BPH, GERD, osteoarthritis, chronic back pain presented in the ED with complaints of generalized weakness and fatigue for last 5 days.  Patient reports having worsening urinary symptoms.  He reports frequency, hesitancy and burning urination.  Patient also describes having left flank pain associated with nausea and throwing up.  Outpatient CT renal study was done which shows bilateral hydronephrosis. Dx of AKI.   Clinical Impression   Pt is currently presenting with below listed deficits, which compromise his ADL performance and overall functional independence (see OT problem list). Pt currently requires supervision for tasks including, lower body dressing, sit to stand, simulated toileting, and ambulating without an assistive device. He reported mild dizziness and feelings of moderate generalized weakness.  Pt was noted to be with short term memory impairment, which his son expressed concerns regarding, though he stated his dad has been demonstrating such memory impairment for the past couple of years. Pt will benefit from OT services in the acute setting to maximize his independence with ADLs and facilitate his safe return home.       If plan is discharge home, recommend the following: Supervision due to cognitive status;Assistance with cooking/housework;Assist for transportation    Functional Status Assessment  Patient has had a recent decline in their functional status and demonstrates the ability to make significant improvements in function in a reasonable and predictable amount of time.  Equipment Recommendations  None recommended by OT    Recommendations for Other Services       Precautions / Restrictions Restrictions Weight  Bearing Restrictions: No      Mobility Bed Mobility Overal bed mobility: Modified Independent Bed Mobility: Supine to Sit     Supine to sit: Modified independent (Device/Increase time)          Transfers Overall transfer level: Needs assistance Equipment used: None Transfers: Sit to/from Stand Sit to Stand: Supervision           Balance     Sitting balance-Leahy Scale: Good         Standing balance comment: supervision without an AD           ADL either performed or assessed with clinical judgement   ADL Overall ADL's : Needs assistance/impaired Eating/Feeding: Independent;Sitting   Grooming: Supervision/safety;Standing Grooming Details (indicate cue type and reason): at sink level         Upper Body Dressing : Set up;Sitting   Lower Body Dressing: Supervision/safety Lower Body Dressing Details (indicate cue type and reason): She doffed then donned his sock seated EOB. Toilet Transfer: Supervision/safety;Ambulation Toilet Transfer Details (indicate cue type and reason): at bathroom level, based on clincial judgement                 Vision   Additional Comments: he correctly read the time depicted on the wall clock            Pertinent Vitals/Pain Pain Assessment Pain Assessment: No/denies pain     Extremity/Trunk Assessment Upper Extremity Assessment Upper Extremity Assessment: Overall WFL for tasks assessed   Lower Extremity Assessment Lower Extremity Assessment: Overall WFL for tasks assessed       Communication Communication Communication: No apparent difficulties   Cognition Arousal: Alert Behavior During Therapy: WFL for tasks assessed/performed   Area of Impairment: Memory, Orientation  General Comments: Oriented to person, place, and month. Disoriented to year. Partially oriented to situation. ABle to follow 1 step commands without difficulty. Short term memory impairment noted, which pt's son stated has been  ongoing for a couple years                Home Living Family/patient expects to be discharged to:: Private residence     Type of Home: House Home Access: Stairs to enter [employer] of Steps: 3 Entrance Stairs-Rails: Right Home Layout: Two level;Able to live on main level with bedroom/bathroom               Home Equipment: Cane - single point          Prior Functioning/Environment Prior Level of Function : Independent/Modified Independent;Driving             Mobility Comments:  (Independent with ambulation.) ADLs Comments:  (Independent with ADLs and driving. He has hired assist 2x per month for cleaning. Pt's son states pt only does simple meal prep (not on stove), due to concerns regarding his memory. His son also indicates pt tends to forget to bathe.)        OT Problem List: Decreased strength;Decreased cognition;Decreased safety awareness      OT Treatment/Interventions: Self-care/ADL training;Therapeutic exercise;DME and/or AE instruction;Therapeutic activities;Cognitive remediation/compensation;Patient/family education    OT Goals(Current goals can be found in the care plan section) Acute Rehab OT Goals OT Goal Formulation: With patient Time For Goal Achievement: 02/25/23 Potential to Achieve Goals: Good ADL Goals Pt Will Perform Grooming: with modified independence;standing Pt Will Perform Lower Body Dressing: with modified independence;sit to/from stand Pt Will Transfer to Toilet: with modified independence;ambulating Pt Will Perform Toileting - Clothing Manipulation and hygiene: with modified independence;sit to/from stand  OT Frequency: Min 1X/week       AM-PAC OT "6 Clicks" Daily Activity     Outcome Measure Help from another person eating meals?: None Help from another person taking care of personal grooming?: None Help from another person toileting, which includes using toliet, bedpan, or urinal?: A Little Help from another  person bathing (including washing, rinsing, drying)?: A Little Help from another person to put on and taking off regular upper body clothing?: A Little Help from another person to put on and taking off regular lower body clothing?: A Little 6 Click Score: 20   End of Session Equipment Utilized During Treatment: Other (comment) (N/A) Nurse Communication: Mobility status  Activity Tolerance: Patient tolerated treatment well Patient left: in chair;with family/visitor present  OT Visit Diagnosis: Muscle weakness (generalized) (M62.81);Unsteadiness on feet (R26.81)                Time: 4098-1191 OT Time Calculation (min): 17 min Charges:  OT General Charges $OT Visit: 1 Visit OT Evaluation $OT Eval Low Complexity: 1 Low    Cap Massi L Renso Swett, OTR/l 02/11/2023, 5:43 PM

## 2023-02-11 NOTE — TOC CM/SW Note (Signed)
Transition of Care [facility]) - Inpatient Brief Assessment   Patient Details  Name: MUHAMMADYUSUF RAZI MRN: [id number] Date of Birth: 13-Sep-1933  Transition of Care [facility]) CM/SW Contact:    Larrie Kass, LCSW Phone Number: 02/11/2023, 12:54 PM   Clinical Narrative:   Transition of Care Department [facility]) has reviewed patient and no TOC needs have been identified at this time. We will continue to monitor patient advancement through interdisciplinary progression rounds. If new patient transition needs arise, please place a TOC consult.  Transition of Care Asessment: Insurance and Status: Insurance coverage has been reviewed Patient has primary care physician: Yes Home environment has been reviewed: alone Prior level of function:: mod independent Prior/Current Home Services: No current home services Social Determinants of Health Reivew: SDOH reviewed no interventions necessary Readmission risk has been reviewed: Yes Transition of care needs: no transition of care needs at this time

## 2023-02-11 NOTE — Progress Notes (Signed)
PROGRESS NOTE    Willie Lynch  ZOX:[id number] DOB: 05/16/1934 DOA: 02/10/2023 PCP: Karie Georges, MD   Brief Narrative:  This 89 y.o. male with PMH significant for dementia, essential hypertension, hyperlipidemia, CAD with stents, BPH, GERD, osteoarthritis, chronic back pain presented in the ED with complaints of generalized weakness and fatigue for last 5 days.  Patient reports having worsening urinary symptoms.  He reports frequency, hesitancy and burning urination.  Patient also describes having left flank pain associated with nausea and throwing up.  Patient went to see his primary care physician yesterday where all the labs were done.  Outpatient CT renal study was done which shows bilateral hydronephrosis. CT renal study: New mild bilateral hydroureteronephrosis, likely due to vesicoureteral reflux given markedly distended state of the urinary bladder.  Patient admitted for further evaluation.  Assessment & Plan:   Principal Problem:   AKI (acute kidney injury) (HCC) Active Problems:   HYPERCHOLESTEROLEMIA   Essential hypertension   CORONARY ATHEROSCLEROSIS NATIVE CORONARY ARTERY   Osteoarthritis   RLS (restless legs syndrome)   BPH (benign prostatic hyperplasia)  Acute kidney injury , Likely post obstructive: Patient with history of BPH presented with hesitancy, frequency, left flank pain. CT renal study showed bilateral mild hydronephrosis. Baseline serum creatinine normal,  presented with creatinine 2.51. Patient felt improved after catheterization. Denies any fever, no leukocytosis, UA unremarkable. Will hold on antibiotics at this time. Continue IV fluid resuscitation. Avoid nephrotoxic medications. Serum creatinine has improved.  Bilateral hydroureteronephrosis: Patient has scheduled appointment today with Dr. Mena Goes. Urology is requested to see patient while in hospital.   Hypokalemia: Replaced.  Continue to monitor   Essential hypertension: Continue  amlodipine, hold losartan in the setting of AKI   Hyperlipidemia; Continue Crestor.   Coronary artery disease: Continue amlodipine and Crestor.   BPH: Continue Proscar.   Dementia: Continue Aricept.  Generalized weakness: PT and OT evaluation.  Abdominal lymphadenopathy: CT abdomen showed incidental findings suspicious for lymphoproliferative disorder. Needs outpatient workup.   DVT prophylaxis: Heparin Code Status: Full code Family Communication: Son is at bedside Disposition Plan:    Status is: Inpatient Remains inpatient appropriate because:  Admitted for mild hydronephrosis requiring Foley insertion.  AKI in the setting of enlarged prostate is now resolved.  Urology is requested to consult while inpatient.     Consultants:  Urology  Procedures: CT renal study  Antimicrobials:  Anti-infectives (From admission, onward)    None       Subjective: Patient was seen and examined at bedside.  Overnight events noted.   Patient reports feeling weak and tired.  He reports flank pain is improved. Urine appears very concentrated.  Continued on IV hydration.  Objective: Vitals:   02/10/23 1700 02/10/23 1932 02/10/23 2316 02/11/23 0325  BP: (!) 144/77 123/61 120/67 127/69  Pulse: 76 81 77 64  Resp: 10 16 18 18   Temp: 98.5 F (36.9 C) 97.9 F (36.6 C) 97.8 F (36.6 C) 98.2 F (36.8 C)  TempSrc: Oral Oral Oral Oral  SpO2: 97% 97% 95% 95%  Weight:      Height:        Intake/Output Summary (Last 24 hours) at 02/11/2023 1159 Last data filed at 02/11/2023 0559 Gross per 24 hour  Intake 2455.58 ml  Output 1450 ml  Net 1005.58 ml   Filed Weights   02/10/23 1437 02/10/23 1442  Weight: 72.6 kg 72.5 kg    Examination:  General exam: Appears calm and comfortable, deconditioned, not in any acute  distress. Respiratory system: Clear to auscultation. Respiratory effort normal. RR 14 Cardiovascular system: S1 & S2 heard, RRR. No JVD, murmurs. No pedal  edema. Gastrointestinal system: Abdomen is nondistended, soft and nontender.Normal bowel sounds heard. Central nervous system: Alert and oriented X 1 . No focal neurological deficits. Extremities: Symmetric 5 x 5 power. Skin: No rashes, lesions or ulcers Psychiatry: Judgement and insight appear normal. Mood & affect appropriate.     Data Reviewed: I have personally reviewed following labs and imaging studies  CBC: Recent Labs  Lab 02/09/23 1559 02/10/23 1535 02/10/23 2103 02/11/23 0424  WBC 12.3* 8.5 6.9 6.8  NEUTROABS 10.0* 6.4  --   --   HGB 16.2 15.5 13.6 13.1  HCT 49.3 45.7 39.9 38.4*  MCV 94.8 92.3 93.9 92.8  PLT 151.0 126* 110* 98*   Basic Metabolic Panel: Recent Labs  Lab 02/09/23 1559 02/10/23 1535 02/10/23 1805 02/11/23 0424  NA 139 138  --  136  K 3.2* 3.1*  --  2.9*  CL 98 99  --  103  CO2 26 26  --  27  GLUCOSE 118* 116*  --  97  BUN 28* 37*  --  24*  CREATININE 1.53* 2.15* 1.79* 1.04  CALCIUM 10.0 9.5  --  8.2*  MG  --  2.5*  --  2.1  PHOS  --   --   --  3.0   GFR: Estimated Creatinine Clearance: 51.3 mL/min (by C-G formula based on SCr of 1.04 mg/dL). Liver Function Tests: Recent Labs  Lab 02/09/23 1559 02/10/23 1535 02/11/23 0424  AST 16 15 15   ALT 11 13 12   ALKPHOS 88 74 57  BILITOT 3.9* 3.6* 2.3*  PROT 7.1 6.6 5.2*  ALBUMIN 4.3 3.8 2.9*   Recent Labs  Lab 02/10/23 1535  LIPASE 23   No results for input(s): "AMMONIA" in the last 168 hours. Coagulation Profile: No results for input(s): "INR", "PROTIME" in the last 168 hours. Cardiac Enzymes: Recent Labs  Lab 02/10/23 1805  CKTOTAL 36*   BNP (last 3 results) No results for input(s): "PROBNP" in the last 8760 hours. HbA1C: No results for input(s): "HGBA1C" in the last 72 hours. CBG: No results for input(s): "GLUCAP" in the last 168 hours. Lipid Profile: No results for input(s): "CHOL", "HDL", "LDLCALC", "TRIG", "CHOLHDL", "LDLDIRECT" in the last 72 hours. Thyroid Function  Tests: No results for input(s): "TSH", "T4TOTAL", "FREET4", "T3FREE", "THYROIDAB" in the last 72 hours. Anemia Panel: No results for input(s): "VITAMINB12", "FOLATE", "FERRITIN", "TIBC", "IRON", "RETICCTPCT" in the last 72 hours. Sepsis Labs: No results for input(s): "PROCALCITON", "LATICACIDVEN" in the last 168 hours.  Recent Results (from the past 240 hour(s))  Culture, Urine     Status: None   Collection Time: 02/09/23  3:59 PM   Specimen: Blood  Result Value Ref Range Status   MICRO NUMBER: [id number]  Final   SPECIMEN QUALITY: Adequate  Final   Sample Source URINE  Final   STATUS: FINAL  Final   Result:   Final    Mixed genital flora isolated. These superficial bacteria are not indicative of a urinary tract infection. No further organism identification is warranted on this specimen. If clinically indicated, recollect clean-catch, mid-stream urine and transfer  immediately to Urine Culture Transport Tube.     Radiology Studies: CT RENAL STONE STUDY  Result Date: 02/10/2023 CLINICAL DATA:  Left flank pain and abdominal pain for 5 days. Nausea. Gross hematuria. EXAM: CT ABDOMEN AND PELVIS WITHOUT CONTRAST TECHNIQUE: Multidetector  CT imaging of the abdomen and pelvis was performed following the standard protocol without IV contrast. RADIATION DOSE REDUCTION: This exam was performed according to the departmental dose-optimization program which includes automated exposure control, adjustment of the mA and/or kV according to patient size and/or use of iterative reconstruction technique. COMPARISON:  07/23/2015 FINDINGS: Lower chest: No acute findings. Hepatobiliary: No mass visualized on this unenhanced exam. Tiny calcified gallstones noted, without signs of cholecystitis or biliary ductal dilatation. Pancreas: No mass or inflammatory process visualized on this unenhanced exam. Spleen:  Within normal limits in size. Adrenals/Urinary tract: New mild bilateral hydroureteronephrosis is seen to the  level of the urinary bladder. No urinary calculi identified. This is likely due to vesicoureteral reflux given markedly distended state of the urinary bladder. Urinary retention cannot be excluded. Stomach/Bowel: No evidence of obstruction, inflammatory process, or abnormal fluid collections. Normal appendix visualized. Diffuse colonic diverticulosis noted, without signs of diverticulitis. Vascular/Lymphatic: New shotty lymphadenopathy is seen throughout the mesentery and abdominal retroperitoneum. New mild bilateral external iliac lymphadenopathy is also seen. No evidence of abdominal aortic aneurysm. Reproductive: Markedly enlarged prostate gland, increased in size since previous study. Other: Mesh again seen from previous left inguinal hernia repair. No evidence of recurrent hernia. Musculoskeletal:  No suspicious bone lesions identified. IMPRESSION: New mild bilateral hydroureteronephrosis, likely due to vesicoureteral reflux given markedly distended state of the urinary bladder. Urinary retention cannot be excluded. Markedly enlarged prostate gland, increased in size since previous study. No evidence of urolithiasis. New shotty lymphadenopathy throughout the abdomen and pelvis, suspicious for lymphoproliferative disorder, although differential diagnosis also includes metastatic disease. Cholelithiasis. No radiographic evidence of cholecystitis. Colonic diverticulosis, without radiographic evidence of diverticulitis. Electronically Signed   By: Danae Orleans M.D.   On: 02/10/2023 11:58    Scheduled Meds:  amLODipine  5 mg Oral Daily   Chlorhexidine Gluconate Cloth  6 each Topical Daily   clopidogrel  75 mg Oral Daily   docusate sodium  100 mg Oral BID   donepezil  5 mg Oral QHS   finasteride  5 mg Oral Daily   heparin  5,000 Units Subcutaneous Q8H   nebivolol  2.5 mg Oral Daily   rosuvastatin  40 mg Oral Daily   Continuous Infusions:  sodium chloride 75 mL/hr at 02/11/23 0044   lactated ringers  Stopped (02/11/23 0044)     LOS: 1 day    Time spent: 35 mins    Willeen Niece, MD [facility]   If 7PM-7AM, please contact night-coverage

## 2023-02-11 NOTE — Consult Note (Addendum)
Urology Consult Note   Requesting Attending Physician:  Willeen Niece, MD Service Providing Consult: Urology  Consulting Attending: Dr.    Jaquita Rector for Consult:  urinary retention  HPI: Willie Lynch is seen in consultation for reasons noted above at the request of Willeen Niece, MD. patient was seen by Dr. Mena Goes remotely, 5-6 years ago for elevated PSA.  He was scheduled to be seen by [facility] today for markedly enlarged prostate and urinary retention his PCP.  He has a prescription for finasteride but has not been taking his medication with any regularity.  Before patient could make it to his appointment he ended up in hospital with severe bladder outlet obstruction secondary to a markedly enlarged prostate.  His family has requested that we present to the bedside because he missed his appointment today.  ------------------  Assessment:  89 y.o. male with urinary retention and markedly enlarged prostate.   Recommendations:  Case and plan discussed with Dr. Cardell Peach and Dr. Mena Goes  # Urinary retention # BPH versus prostate cancer Foley catheter in place draining light tan urine with old blood collected in Foley bag.  Foley catheter to remain in place until patient follows up in clinic.  I suspect he will obstruct again if it is removed.  He will require at least a week of decompression and treatment of his stretch injury. His PSA would be falsely elevated due to his severe retention if checked now.  Recommend waiting 2 to 3 weeks before repeating. The adenopathy could be secondary to severe retention or metastatic disease.  All of the workup for this would be completed in the outpatient setting. Urology will sign off at this time.  Please call with questions. Past Medical History: Past Medical History:  Diagnosis Date   Abdominal pain, unspecified site    Acquired cyst of kidney    Allergic rhinitis, cause unspecified    CAD (coronary artery disease)    NSTEMI  5/10. Rotational atherectomy/PCI w Xience DES x 3 to RCA and rotation atherectomy /PCI w Xience DES to prox LAD   Diverticulosis of colon (without mention of hemorrhage)    last colon 11/05 by DrSamLeB w divertics only   Elevated prostate specific antigen (PSA)    followed by Dr Vonita Moss for urology   GERD (gastroesophageal reflux disease)    esophagitis   Hyperlipidemia    Hypertension    ACEI cough   Irritable bowel syndrome    Ischemic cardiomyopathy    mild echo (8/10) w EF 45-50%, diffuse hypokinesis, mild AI and mild MR   Lumbago    Migraine, unspecified, without mention of intractable migraine without mention of status migrainosus    Osteoarthrosis, unspecified whether generalized or localized, unspecified site     Past Surgical History:  Past Surgical History:  Procedure Laterality Date   ANGIOPLASTY     bilat inguinal hernia repairs  7/06   Dr Daphine Deutscher   CARDIAC CATHETERIZATION     CORONARY ANGIOPLASTY     x4   rotator cuff surgery     Dr. Simonne Come    Medication: Current Facility-Administered Medications  Medication Dose Route Frequency Provider Last Rate Last Admin   0.9 %  sodium chloride infusion   Intravenous Continuous Willeen Niece, MD 75 mL/hr at 02/11/23 0044 New Bag at 02/11/23 0044   acetaminophen (TYLENOL) tablet 650 mg  650 mg Oral Q6H PRN Willeen Niece, MD       Or   acetaminophen (TYLENOL) suppository 650 mg  650 mg Rectal Q6H PRN Willeen Niece, MD       amLODipine (NORVASC) tablet 5 mg  5 mg Oral Daily Willeen Niece, MD   5 mg at 02/11/23 4098   Chlorhexidine Gluconate Cloth 2 % PADS 6 each  6 each Topical Daily Willeen Niece, MD       clopidogrel (PLAVIX) tablet 75 mg  75 mg Oral Daily Willeen Niece, MD   75 mg at 02/11/23 0912   docusate sodium (COLACE) capsule 100 mg  100 mg Oral BID Willeen Niece, MD   100 mg at 02/11/23 0912   donepezil (ARICEPT) tablet 5 mg  5 mg Oral QHS Willeen Niece, MD   5 mg at 02/10/23 2228   finasteride  (PROSCAR) tablet 5 mg  5 mg Oral Daily Willeen Niece, MD   5 mg at 02/10/23 2228   heparin injection 5,000 Units  5,000 Units Subcutaneous Q8H Willeen Niece, MD   5,000 Units at 02/11/23 1191   lactated ringers infusion   Intravenous Continuous Gloris Manchester, MD   Stopped at 02/11/23 0044   nebivolol (BYSTOLIC) tablet 2.5 mg  2.5 mg Oral Daily Willeen Niece, MD   2.5 mg at 02/10/23 2228   ondansetron (ZOFRAN) tablet 4 mg  4 mg Oral Q6H PRN Willeen Niece, MD       Or   ondansetron (ZOFRAN) injection 4 mg  4 mg Intravenous Q6H PRN Willeen Niece, MD       Oral care mouth rinse  15 mL Mouth Rinse PRN Willeen Niece, MD       potassium chloride (KLOR-CON) packet 40 mEq  40 mEq Oral Once Willeen Niece, MD       rosuvastatin (CRESTOR) tablet 40 mg  40 mg Oral Daily Willeen Niece, MD   40 mg at 02/11/23 4782    Allergies: Allergies  Allergen Reactions   Amoxicillin-Pot Clavulanate Hives and Other (See Comments)    Patient developed HIVES on augmentin   Codeine Other (See Comments)    Exact reaction not recalled by the patient   Morphine Other (See Comments)    Exact reaction not recalled by the patient    Social History: Social History   Tobacco Use   Smoking status: Former    Current packs/day: 0.00    Types: Cigarettes    Quit date: 06/22/1961    Years since quitting: 61.6   Smokeless tobacco: Never  Vaping Use   Vaping status: Never Used  Substance Use Topics   Alcohol use: Yes    Comment: 4 drinks per week    Drug use: No    Family History Family History  Problem Relation Age of Onset   Heart disease Father        smoker   Heart attack Father    Cancer Mother    Cancer Other        ?? not sure what kind    Review of Systems  Unable to perform ROS: Dementia     Objective   Vital signs in last 24 hours: BP 117/70 (BP Location: Left Arm)   Pulse 68   Temp 97.7 F (36.5 C) (Oral)   Resp 18   Ht 5\' 11"  (1.803 m)   Wt 72.5 kg   SpO2 96%   BMI 22.29  kg/m   Physical Exam General: NAD, A&O, resting, appropriate HEENT: [place]/AT Pulmonary: Normal work of breathing Cardiovascular: RRR, no cyanosis GU: Foley catheter in place draining light tan urine   Most Recent  Labs: Lab Results  Component Value Date   WBC 6.8 02/11/2023   HGB 13.1 02/11/2023   HCT 38.4 (L) 02/11/2023   PLT 98 (L) 02/11/2023    Lab Results  Component Value Date   NA 136 02/11/2023   K 2.9 (L) 02/11/2023   CL 103 02/11/2023   CO2 27 02/11/2023   BUN 24 (H) 02/11/2023   CREATININE 1.04 02/11/2023   CALCIUM 8.2 (L) 02/11/2023   MG 2.1 02/11/2023   PHOS 3.0 02/11/2023    Lab Results  Component Value Date   INR 0.9 02/18/2011     Urine Culture: @[id number] (laburin,org,[id number])@   IMAGING: CT RENAL STONE STUDY  Result Date: 02/10/2023 CLINICAL DATA:  Left flank pain and abdominal pain for 5 days. Nausea. Gross hematuria. EXAM: CT ABDOMEN AND PELVIS WITHOUT CONTRAST TECHNIQUE: Multidetector CT imaging of the abdomen and pelvis was performed following the standard protocol without IV contrast. RADIATION DOSE REDUCTION: This exam was performed according to the departmental dose-optimization program which includes automated exposure control, adjustment of the mA and/or kV according to patient size and/or use of iterative reconstruction technique. COMPARISON:  07/23/2015 FINDINGS: Lower chest: No acute findings. Hepatobiliary: No mass visualized on this unenhanced exam. Tiny calcified gallstones noted, without signs of cholecystitis or biliary ductal dilatation. Pancreas: No mass or inflammatory process visualized on this unenhanced exam. Spleen:  Within normal limits in size. Adrenals/Urinary tract: New mild bilateral hydroureteronephrosis is seen to the level of the urinary bladder. No urinary calculi identified. This is likely due to vesicoureteral reflux given markedly distended state of the urinary bladder. Urinary retention cannot be excluded.  Stomach/Bowel: No evidence of obstruction, inflammatory process, or abnormal fluid collections. Normal appendix visualized. Diffuse colonic diverticulosis noted, without signs of diverticulitis. Vascular/Lymphatic: New shotty lymphadenopathy is seen throughout the mesentery and abdominal retroperitoneum. New mild bilateral external iliac lymphadenopathy is also seen. No evidence of abdominal aortic aneurysm. Reproductive: Markedly enlarged prostate gland, increased in size since previous study. Other: Mesh again seen from previous left inguinal hernia repair. No evidence of recurrent hernia. Musculoskeletal:  No suspicious bone lesions identified. IMPRESSION: New mild bilateral hydroureteronephrosis, likely due to vesicoureteral reflux given markedly distended state of the urinary bladder. Urinary retention cannot be excluded. Markedly enlarged prostate gland, increased in size since previous study. No evidence of urolithiasis. New shotty lymphadenopathy throughout the abdomen and pelvis, suspicious for lymphoproliferative disorder, although differential diagnosis also includes metastatic disease. Cholelithiasis. No radiographic evidence of cholecystitis. Colonic diverticulosis, without radiographic evidence of diverticulitis. Electronically Signed   By: Danae Orleans M.D.   On: 02/10/2023 11:58    ------  Willie Kirschner, NP Pager: [phone]  I have seen and examined the patient and agree with the above assessment and plan. Keep foley to gravity. VT as scheduled outpatient on 02/26/2023.    Please contact the urology consult pager with any further questions/concerns.  Matt R. Jannice Beitzel MD [facility]  Pager: [phone]

## 2023-02-11 NOTE — Evaluation (Signed)
Physical Therapy Evaluation Patient Details Name: Willie Lynch MRN: [id number] DOB: 1934-02-02 Today's Date: 02/11/2023  History of Present Illness  89 y.o. male with PMH significant for dementia, essential hypertension, hyperlipidemia, CAD with stents, BPH, GERD, osteoarthritis, chronic back pain presented in the ED with complaints of generalized weakness and fatigue for last 5 days.  Patient reports having worsening urinary symptoms.  He reports frequency, hesitancy and burning urination.  Patient also describes having left flank pain associated with nausea and throwing up.  Outpatient CT renal study was done which shows bilateral hydronephrosis. Dx of AKI.  Clinical Impression  Pt admitted with above diagnosis. Pt ambulated 110' holding IV pole, no loss of balance, distance limited by fatigue. At baseline pt is independent with mobility, ADLs, and driving.  Pt currently with functional limitations due to the deficits listed below (see PT Problem List). Pt will benefit from acute skilled PT to increase their independence and safety with mobility to allow discharge.           If plan is discharge home, recommend the following: A little help with bathing/dressing/bathroom;Assistance with cooking/housework;Assist for transportation;Help with stairs or ramp for entrance   Can travel by private vehicle        Equipment Recommendations None recommended by PT  Recommendations for Other Services       Functional Status Assessment Patient has had a recent decline in their functional status and demonstrates the ability to make significant improvements in function in a reasonable and predictable amount of time.     Precautions / Restrictions Precautions Precautions: Fall Precaution Comments: denies falls in the past 6 months Restrictions Weight Bearing Restrictions: No      Mobility  Bed Mobility Overal bed mobility: Modified Independent             General bed mobility comments:  HOB up, used rail    Transfers Overall transfer level: Needs assistance Equipment used: None Transfers: Sit to/from Stand Sit to Stand: Supervision                Ambulation/Gait Ambulation/Gait assistance: Supervision Gait Distance (Feet): 110 Feet Assistive device: IV Pole Gait Pattern/deviations: Step-through pattern, Decreased stride length, Trunk flexed Gait velocity: decr     General Gait Details: steady, no loss of balance, distance limited by fatigue  Stairs            Wheelchair Mobility     Tilt Bed    Modified Rankin (Stroke Patients Only)       Balance Overall balance assessment: Modified Independent                                           Pertinent Vitals/Pain Pain Assessment Pain Assessment: No/denies pain    Home Living Family/patient expects to be discharged to:: Private residence Living Arrangements: Alone Available Help at Discharge: Neighbor   Home Access: Stairs to enter       Home Layout: Two level;Able to live on main level with bedroom/bathroom Home Equipment: None      Prior Function Prior Level of Function : Independent/Modified Independent;Driving             Mobility Comments: walks without AD, denies falls in past 6 months, plays golf ADLs Comments: independent, drives     Extremity/Trunk Assessment   Upper Extremity Assessment Upper Extremity Assessment: Overall WFL for tasks assessed  Lower Extremity Assessment Lower Extremity Assessment: Overall WFL for tasks assessed    Cervical / Trunk Assessment Cervical / Trunk Assessment: Normal  Communication   Communication Communication: No apparent difficulties  Cognition Arousal: Alert Behavior During Therapy: WFL for tasks assessed/performed Overall Cognitive Status: Within Functional Limits for tasks assessed                                          General Comments      Exercises     Assessment/Plan     PT Assessment Patient needs continued PT services  PT Problem List Decreased activity tolerance;Decreased balance;Decreased mobility       PT Treatment Interventions Gait training;Therapeutic activities;Patient/family education;Therapeutic exercise    PT Goals (Current goals can be found in the Care Plan section)  Acute Rehab PT Goals Patient Stated Goal: play golf PT Goal Formulation: With patient/family Time For Goal Achievement: 02/25/23 Potential to Achieve Goals: Good    Frequency Min 1X/week     Co-evaluation               AM-PAC PT "6 Clicks" Mobility  Outcome Measure Help needed turning from your back to your side while in a flat bed without using bedrails?: None Help needed moving from lying on your back to sitting on the side of a flat bed without using bedrails?: A Little Help needed moving to and from a bed to a chair (including a wheelchair)?: A Little Help needed standing up from a chair using your arms (e.g., wheelchair or bedside chair)?: A Little Help needed to walk in hospital room?: A Little Help needed climbing 3-5 steps with a railing? : A Little 6 Click Score: 19    End of Session Equipment Utilized During Treatment: Gait belt Activity Tolerance: Patient tolerated treatment well Patient left: in bed;with call bell/phone within reach;with bed alarm set;with family/visitor present Nurse Communication: Mobility status PT Visit Diagnosis: Other abnormalities of gait and mobility (R26.89)    Time: 8295-6213 PT Time Calculation (min) (ACUTE ONLY): 14 min   Charges:   PT Evaluation $PT Eval Moderate Complexity: 1 Mod   PT General Charges $$ ACUTE PT VISIT: 1 Visit         Tamala Ser PT 02/11/2023  Acute Rehabilitation Services  Office [phone]

## 2023-02-12 DIAGNOSIS — N179 Acute kidney failure, unspecified: Secondary | ICD-10-CM | POA: Diagnosis not present

## 2023-02-12 LAB — CBC
HCT: 38.4 % — ABNORMAL LOW (ref 39.0–52.0)
Hemoglobin: 12.8 g/dL — ABNORMAL LOW (ref 13.0–17.0)
MCH: 31.3 pg (ref 26.0–34.0)
MCHC: 33.3 g/dL (ref 30.0–36.0)
MCV: 93.9 fL (ref 80.0–100.0)
Platelets: 90 10*3/uL — ABNORMAL LOW (ref 150–400)
RBC: 4.09 MIL/uL — ABNORMAL LOW (ref 4.22–5.81)
RDW: 14.1 % (ref 11.5–15.5)
WBC: 7.7 10*3/uL (ref 4.0–10.5)
nRBC: 0 % (ref 0.0–0.2)

## 2023-02-12 LAB — COMPREHENSIVE METABOLIC PANEL
ALT: 12 U/L (ref 0–44)
AST: 14 U/L — ABNORMAL LOW (ref 15–41)
Albumin: 2.9 g/dL — ABNORMAL LOW (ref 3.5–5.0)
Alkaline Phosphatase: 60 U/L (ref 38–126)
Anion gap: 6 (ref 5–15)
BUN: 17 mg/dL (ref 8–23)
CO2: 27 mmol/L (ref 22–32)
Calcium: 7.8 mg/dL — ABNORMAL LOW (ref 8.9–10.3)
Chloride: 104 mmol/L (ref 98–111)
Creatinine, Ser: 0.71 mg/dL (ref 0.61–1.24)
GFR, Estimated: >60 mL/min (ref >60)
Glucose, Bld: 98 mg/dL (ref 70–99)
Potassium: 3.1 mmol/L — ABNORMAL LOW (ref 3.5–5.1)
Sodium: 137 mmol/L (ref 135–145)
Total Bilirubin: 1.5 mg/dL — ABNORMAL HIGH (ref 0.3–1.2)
Total Protein: 5.1 g/dL — ABNORMAL LOW (ref 6.5–8.1)

## 2023-02-12 LAB — MAGNESIUM: Magnesium: 1.9 mg/dL (ref 1.7–2.4)

## 2023-02-12 LAB — PHOSPHORUS: Phosphorus: 2 mg/dL — ABNORMAL LOW (ref 2.5–4.6)

## 2023-02-12 MED ORDER — POTASSIUM PHOSPHATES 15 MMOLE/5ML IV SOLN
30.0000 mmol | Freq: Once | INTRAVENOUS | Status: AC
  Administered 2023-02-12: 30 mmol via INTRAVENOUS
  Filled 2023-02-12: qty 10

## 2023-02-12 MED ORDER — POTASSIUM CHLORIDE 20 MEQ PO PACK
40.0000 meq | PACK | Freq: Once | ORAL | Status: AC
  Administered 2023-02-12: 40 meq via ORAL
  Filled 2023-02-12: qty 2

## 2023-02-12 NOTE — Plan of Care (Signed)
  Problem: Clinical Measurements: Goal: Ability to maintain clinical measurements within normal limits will improve Outcome: Progressing Goal: Respiratory complications will improve Outcome: Progressing Goal: Cardiovascular complication will be avoided Outcome: Progressing   Problem: Coping: Goal: Level of anxiety will decrease Outcome: Progressing   

## 2023-02-12 NOTE — Discharge Instructions (Signed)
Advised to follow-up with primary care physician in 1 week. Advised to hold losartan as blood pressure has been stable. Advised to follow-up with urology as scheduled. Patient is being discharged with indwelling Foley catheter.

## 2023-02-12 NOTE — Discharge Summary (Signed)
Physician Discharge Summary  DASHUN KOSTELNIK NFA:[id number] DOB: Apr 27, 1934 DOA: 02/10/2023  PCP: Karie Georges, MD  Admit date: 02/10/2023  Discharge date: 02/13/2023  Admitted From: Home.  Disposition:  Home.  Recommendations for Outpatient Follow-up:  Follow up with PCP in 1-2 weeks. Please obtain BMP/CBC in one week. Advised to hold losartan as blood pressure has been stable. Advised to follow-up with urology as scheduled. Patient is being discharged with indwelling Foley catheter. Advised to take Neutra-phos three times daily for 3 days.  Home Health:None Equipment/Devices:Indwelling foley cathter  Discharge Condition: Stable CODE STATUS:Full code Diet recommendation: Heart Healthy   Brief Summary/ Hospital Course: This 89 y.o. male with PMH significant for dementia, essential hypertension, hyperlipidemia, CAD with stents, BPH, GERD, osteoarthritis, chronic back pain presented in the ED with complaints of generalized weakness and fatigue for last 5 days.  Patient reports having worsening urinary symptoms.  He reports frequency, hesitancy and burning urination.  Patient also describes having left flank pain associated with nausea and throwing up.  Patient went to see his primary care physician yesterday where all the labs were done.  Outpatient CT renal study was done which shows bilateral hydronephrosis. CT renal study: New mild bilateral hydroureteronephrosis, likely due to vesicoureteral reflux given markedly distended state of the urinary bladder.  Patient was admitted for further evaluation.  Patient felt significantly improved after Foley insertion.  There was no white cell count, no fever,  UA unremarkable.  Patient was not started on antibiotics.  Urology was consulted.  Renal function has significantly improved and back to baseline.  Urology recommended to continue finasteride and Foley, Patient can be discharged and advised patient to follow-up with primary care physician  and urology.  Patient is being discharged home.   Discharge Diagnoses:  Principal Problem:   AKI (acute kidney injury) (HCC) Active Problems:   HYPERCHOLESTEROLEMIA   Essential hypertension   CORONARY ATHEROSCLEROSIS NATIVE CORONARY ARTERY   Osteoarthritis   RLS (restless legs syndrome)   BPH (benign prostatic hyperplasia)  Acute kidney injury , Likely post obstructive: Patient with history of BPH presented with hesitancy, frequency, left flank pain. CT renal study showed bilateral mild hydronephrosis. Baseline serum creatinine normal,  presented with creatinine 2.51. Patient felt improved after catheterization. Denies any fever, no leukocytosis, UA unremarkable. Will hold on antibiotics at this time. Continue IV fluid resuscitation. Avoid nephrotoxic medications. Serum creatinine has improved.  AKI  resolved.   Bilateral hydroureteronephrosis: Patient has scheduled appointment yesterday with Dr. Mena Goes. Urology is requested to see patient while in hospital. Urology recommended to continue Foley catheter and finasteride. Patient can be discharged with Foley catheter and outpatient follow-up   Hypokalemia: Replaced.  Continue to monitor   Essential hypertension: Continue amlodipine, hold losartan in the setting of AKI   Hyperlipidemia; Continue Crestor.   Coronary artery disease: Continue amlodipine and Crestor.   BPH: Continue Proscar.   Dementia: Continue Aricept.   Generalized weakness: PT and OT evaluation.   Abdominal lymphadenopathy: CT abdomen showed incidental findings suspicious for lymphoproliferative disorder. Needs outpatient workup.  Discharge Instructions  Discharge Instructions     Call MD for:  persistant dizziness or light-headedness   Complete by: As directed    Call MD for:  persistant nausea and vomiting   Complete by: As directed    Diet - low sodium heart healthy   Complete by: As directed    Diet Carb Modified   Complete by: As  directed    Discharge instructions  Complete by: As directed    Advised to follow-up with primary care physician in 1 week. Advised to hold losartan as blood pressure has been stable. Advised to follow-up with urology as scheduled. Patient is being discharged with indwelling Foley catheter.   Increase activity slowly   Complete by: As directed       Allergies as of 02/13/2023       Reactions   Amoxicillin-pot Clavulanate Hives, Other (See Comments)   Patient developed HIVES on augmentin   Codeine Other (See Comments)   Exact reaction not recalled by the patient   Morphine Other (See Comments)   Exact reaction not recalled by the patient        Medication List     STOP taking these medications    losartan 50 MG tablet Commonly known as: COZAAR       TAKE these medications    amLODipine 5 MG tablet Commonly known as: NORVASC TAKE 1 TABLET BY MOUTH EVERY DAY What changed:  how much to take how to take this when to take this additional instructions   clopidogrel 75 MG tablet Commonly known as: PLAVIX TAKE 1 TABLET(75 MG) BY MOUTH DAILY   cyanocobalamin 500 MCG tablet Commonly known as: VITAMIN B12 Take 1 tablet (500 mcg total) by mouth daily. What changed: when to take this   donepezil 10 MG tablet Commonly known as: ARICEPT TAKE 1 TABLET(10 MG) BY MOUTH AT BEDTIME What changed:  how much to take how to take this when to take this additional instructions   escitalopram 5 MG tablet Commonly known as: Lexapro Take 1 tablet (5 mg total) by mouth daily. What changed: when to take this   finasteride 5 MG tablet Commonly known as: PROSCAR Take 1 tablet (5 mg total) by mouth daily. What changed: when to take this   nebivolol 2.5 MG tablet Commonly known as: Bystolic Take 1 tablet (2.5 mg total) by mouth daily. What changed: when to take this   nitroGLYCERIN 0.4 MG SL tablet Commonly known as: Nitrostat DISSOLVE 1 TABLET UNDER THE TONGUE IF  NEEDED AS DIRECTED. What changed:  how much to take how to take this when to take this reasons to take this additional instructions   ondansetron 4 MG tablet Commonly known as: Zofran Take 1 tablet (4 mg total) by mouth every 6 (six) hours as needed for nausea or vomiting.   phosphorus 155-852-130 MG tablet Commonly known as: K PHOS NEUTRAL Take 1 tablet (250 mg total) by mouth 3 (three) times daily for 3 days.   rosuvastatin 40 MG tablet Commonly known as: CRESTOR Take 1 tablet (40 mg total) by mouth daily. What changed: when to take this   TYLENOL 500 MG tablet Generic drug: acetaminophen Take 500-1,000 mg by mouth every 6 (six) hours as needed for mild pain or headache.         Follow-up Information     Karie Georges, MD Follow up in 1 week(s).   Specialty: Family Medicine Contact information: [address] [place] [address] [phone]         Jerilee Field, MD Follow up in 1 week(s).   Specialty: Urology Contact information: [address] [place] [address] [phone]                Allergies  Allergen Reactions   Amoxicillin-Pot Clavulanate Hives and Other (See Comments)    Patient developed HIVES on augmentin   Codeine Other (See Comments)  Exact reaction not recalled by the patient   Morphine Other (See Comments)    Exact reaction not recalled by the patient    Consultations: Urology   Procedures/Studies: CT RENAL STONE STUDY  Result Date: 02/10/2023 CLINICAL DATA:  Left flank pain and abdominal pain for 5 days. Nausea. Gross hematuria. EXAM: CT ABDOMEN AND PELVIS WITHOUT CONTRAST TECHNIQUE: Multidetector CT imaging of the abdomen and pelvis was performed following the standard protocol without IV contrast. RADIATION DOSE REDUCTION: This exam was performed according to the departmental dose-optimization program which includes automated exposure control, adjustment of the mA and/or kV according to patient  size and/or use of iterative reconstruction technique. COMPARISON:  07/23/2015 FINDINGS: Lower chest: No acute findings. Hepatobiliary: No mass visualized on this unenhanced exam. Tiny calcified gallstones noted, without signs of cholecystitis or biliary ductal dilatation. Pancreas: No mass or inflammatory process visualized on this unenhanced exam. Spleen:  Within normal limits in size. Adrenals/Urinary tract: New mild bilateral hydroureteronephrosis is seen to the level of the urinary bladder. No urinary calculi identified. This is likely due to vesicoureteral reflux given markedly distended state of the urinary bladder. Urinary retention cannot be excluded. Stomach/Bowel: No evidence of obstruction, inflammatory process, or abnormal fluid collections. Normal appendix visualized. Diffuse colonic diverticulosis noted, without signs of diverticulitis. Vascular/Lymphatic: New shotty lymphadenopathy is seen throughout the mesentery and abdominal retroperitoneum. New mild bilateral external iliac lymphadenopathy is also seen. No evidence of abdominal aortic aneurysm. Reproductive: Markedly enlarged prostate gland, increased in size since previous study. Other: Mesh again seen from previous left inguinal hernia repair. No evidence of recurrent hernia. Musculoskeletal:  No suspicious bone lesions identified. IMPRESSION: New mild bilateral hydroureteronephrosis, likely due to vesicoureteral reflux given markedly distended state of the urinary bladder. Urinary retention cannot be excluded. Markedly enlarged prostate gland, increased in size since previous study. No evidence of urolithiasis. New shotty lymphadenopathy throughout the abdomen and pelvis, suspicious for lymphoproliferative disorder, although differential diagnosis also includes metastatic disease. Cholelithiasis. No radiographic evidence of cholecystitis. Colonic diverticulosis, without radiographic evidence of diverticulitis. Electronically Signed   By: Danae Orleans M.D.   On: 02/10/2023 11:58    Subjective: Patient was seen and examined at bedside.  Overnight events noted.   Patient report doing much better and wants to be discharged.  Patient is being discharged home.  Discharge Exam: Vitals:   02/13/23 0517 02/13/23 0546  BP: (!) 179/87 (!) 153/75  Pulse: 61 (!) 57  Resp: 17   Temp: 97.6 F (36.4 C)   SpO2: 97%    Vitals:   02/12/23 1224 02/12/23 2112 02/13/23 0517 02/13/23 0546  BP: (!) 145/78 130/71 (!) 179/87 (!) 153/75  Pulse: 61 65 61 (!) 57  Resp: 18 16 17    Temp: 97.6 F (36.4 C) 98.1 F (36.7 C) 97.6 F (36.4 C)   TempSrc: Oral Oral Oral   SpO2: 98% 97% 97%   Weight:      Height:        General: Pt is alert, awake, not in acute distress Cardiovascular: RRR, S1/S2 +, no rubs, no gallops Respiratory: CTA bilaterally, no wheezing, no rhonchi Abdominal: Soft, NT, ND, bowel sounds + Extremities: no edema, no cyanosis    The results of significant diagnostics from this hospitalization (including imaging, microbiology, ancillary and laboratory) are listed below for reference.     Microbiology: Recent Results (from the past 240 hour(s))  Culture, Urine     Status: None   Collection Time: 02/09/23  3:59 PM  Specimen: Blood  Result Value Ref Range Status   MICRO NUMBER: [id number]  Final   SPECIMEN QUALITY: Adequate  Final   Sample Source URINE  Final   STATUS: FINAL  Final   Result:   Final    Mixed genital flora isolated. These superficial bacteria are not indicative of a urinary tract infection. No further organism identification is warranted on this specimen. If clinically indicated, recollect clean-catch, mid-stream urine and transfer  immediately to Urine Culture Transport Tube.      Labs: BNP (last 3 results) No results for input(s): "BNP" in the last 8760 hours. Basic Metabolic Panel: Recent Labs  Lab 02/09/23 1559 02/10/23 1535 02/10/23 1805 02/11/23 0424 02/12/23 0355 02/13/23 1113  NA 139  138  --  136 137 134*  K 3.2* 3.1*  --  2.9* 3.1* 3.3*  CL 98 99  --  103 104 102  CO2 26 26  --  27 27 27   GLUCOSE 118* 116*  --  97 98 137*  BUN 28* 37*  --  24* 17 8  CREATININE 1.53* 2.15* 1.79* 1.04 0.71 0.63  CALCIUM 10.0 9.5  --  8.2* 7.8* 8.1*  MG  --  2.5*  --  2.1 1.9  --   PHOS  --   --   --  3.0 2.0* 1.9*   Liver Function Tests: Recent Labs  Lab 02/09/23 1559 02/10/23 1535 02/11/23 0424 02/12/23 0355  AST 16 15 15  14*  ALT 11 13 12 12   ALKPHOS 88 74 57 60  BILITOT 3.9* 3.6* 2.3* 1.5*  PROT 7.1 6.6 5.2* 5.1*  ALBUMIN 4.3 3.8 2.9* 2.9*   Recent Labs  Lab 02/10/23 1535  LIPASE 23   No results for input(s): "AMMONIA" in the last 168 hours. CBC: Recent Labs  Lab 02/09/23 1559 02/10/23 1535 02/10/23 2103 02/11/23 0424 02/12/23 0355  WBC 12.3* 8.5 6.9 6.8 7.7  NEUTROABS 10.0* 6.4  --   --   --   HGB 16.2 15.5 13.6 13.1 12.8*  HCT 49.3 45.7 39.9 38.4* 38.4*  MCV 94.8 92.3 93.9 92.8 93.9  PLT 151.0 126* 110* 98* 90*   Cardiac Enzymes: Recent Labs  Lab 02/10/23 1805  CKTOTAL 36*   BNP: Invalid input(s): "POCBNP" CBG: No results for input(s): "GLUCAP" in the last 168 hours. D-Dimer No results for input(s): "DDIMER" in the last 72 hours. Hgb A1c No results for input(s): "HGBA1C" in the last 72 hours. Lipid Profile No results for input(s): "CHOL", "HDL", "LDLCALC", "TRIG", "CHOLHDL", "LDLDIRECT" in the last 72 hours. Thyroid function studies No results for input(s): "TSH", "T4TOTAL", "T3FREE", "THYROIDAB" in the last 72 hours.  Invalid input(s): "FREET3" Anemia work up No results for input(s): "VITAMINB12", "FOLATE", "FERRITIN", "TIBC", "IRON", "RETICCTPCT" in the last 72 hours. Urinalysis    Component Value Date/Time   COLORURINE YELLOW 02/10/2023 1621   APPEARANCEUR HAZY (A) 02/10/2023 1621   LABSPEC 1.014 02/10/2023 1621   PHURINE 5.0 02/10/2023 1621   GLUCOSEU NEGATIVE 02/10/2023 1621   GLUCOSEU NEGATIVE 07/22/2007 1134   HGBUR LARGE (A)  02/10/2023 1621   BILIRUBINUR NEGATIVE 02/10/2023 1621   BILIRUBINUR negative 02/09/2023 1524   KETONESUR NEGATIVE 02/10/2023 1621   PROTEINUR NEGATIVE 02/10/2023 1621   UROBILINOGEN 0.2 02/09/2023 1524   UROBILINOGEN 0.2 mg/dL 69/62/9528 4132   NITRITE NEGATIVE 02/10/2023 1621   LEUKOCYTESUR NEGATIVE 02/10/2023 1621   Sepsis Labs Recent Labs  Lab 02/10/23 1535 02/10/23 2103 02/11/23 0424 02/12/23 0355  WBC 8.5 6.9 6.8 7.7  Microbiology Recent Results (from the past 240 hour(s))  Culture, Urine     Status: None   Collection Time: 02/09/23  3:59 PM   Specimen: Blood  Result Value Ref Range Status   MICRO NUMBER: [id number]  Final   SPECIMEN QUALITY: Adequate  Final   Sample Source URINE  Final   STATUS: FINAL  Final   Result:   Final    Mixed genital flora isolated. These superficial bacteria are not indicative of a urinary tract infection. No further organism identification is warranted on this specimen. If clinically indicated, recollect clean-catch, mid-stream urine and transfer  immediately to Urine Culture Transport Tube.      Time coordinating discharge: Over 30 minutes  SIGNED:   Willeen Niece, MD  [facility] 02/13/2023, 12:21 PM Pager   If 7PM-7AM, please contact night-coverage

## 2023-02-13 ENCOUNTER — Encounter: Payer: Self-pay | Admitting: Family Medicine

## 2023-02-13 DIAGNOSIS — N179 Acute kidney failure, unspecified: Secondary | ICD-10-CM | POA: Diagnosis not present

## 2023-02-13 LAB — BASIC METABOLIC PANEL
Anion gap: 5 (ref 5–15)
BUN: 8 mg/dL (ref 8–23)
CO2: 27 mmol/L (ref 22–32)
Calcium: 8.1 mg/dL — ABNORMAL LOW (ref 8.9–10.3)
Chloride: 102 mmol/L (ref 98–111)
Creatinine, Ser: 0.63 mg/dL (ref 0.61–1.24)
GFR, Estimated: >60 mL/min (ref >60)
Glucose, Bld: 137 mg/dL — ABNORMAL HIGH (ref 70–99)
Potassium: 3.3 mmol/L — ABNORMAL LOW (ref 3.5–5.1)
Sodium: 134 mmol/L — ABNORMAL LOW (ref 135–145)

## 2023-02-13 LAB — PHOSPHORUS: Phosphorus: 1.9 mg/dL — ABNORMAL LOW (ref 2.5–4.6)

## 2023-02-13 MED ORDER — K PHOS MONO-SOD PHOS DI & MONO 155-852-130 MG PO TABS
250.0000 mg | ORAL_TABLET | Freq: Three times a day (TID) | ORAL | Status: DC
  Filled 2023-02-13: qty 1

## 2023-02-13 MED ORDER — POTASSIUM CHLORIDE 20 MEQ PO PACK
40.0000 meq | PACK | Freq: Once | ORAL | Status: AC
  Administered 2023-02-13: 40 meq via ORAL
  Filled 2023-02-13: qty 2

## 2023-02-13 MED ORDER — K PHOS MONO-SOD PHOS DI & MONO 155-852-130 MG PO TABS: 250.0000 mg | ORAL_TABLET | Freq: Three times a day (TID) | ORAL | 0 refills | Status: AC

## 2023-02-13 NOTE — Progress Notes (Addendum)
PROGRESS NOTE    Willie Lynch  GEX:[id number] DOB: 1933/09/17 DOA: 02/10/2023  PCP: Karie Georges, MD   Brief Narrative:  This 89 y.o. male with PMH significant for dementia, essential hypertension, hyperlipidemia, CAD with stents, BPH, GERD, osteoarthritis, chronic back pain presented in the ED with complaints of generalized weakness and fatigue for last 5 days.  Patient reports having worsening urinary symptoms.  He reports frequency, hesitancy and burning urination.  Patient also describes having left flank pain associated with nausea and throwing up.  Patient went to see his primary care physician yesterday where all the labs were done.  Outpatient CT renal study was done which shows bilateral hydronephrosis. CT renal study: New mild bilateral hydroureteronephrosis, likely due to vesicoureteral reflux given markedly distended state of the urinary bladder.  Patient was admitted for further evaluation.  Patient felt significantly improved after Foley insertion.  There was no white cell count, no fever,  UA unremarkable.  Patient was not started on antibiotics.  Urology was consulted.  Renal function has significantly improved and back to baseline.  Urology recommended to continue finasteride and Foley, Patient can be discharged and advised patient to follow-up with primary care physician and urology.  Patient is being discharged home.   Assessment & Plan:   Principal Problem:   AKI (acute kidney injury) (HCC) Active Problems:   HYPERCHOLESTEROLEMIA   Essential hypertension   CORONARY ATHEROSCLEROSIS NATIVE CORONARY ARTERY   Osteoarthritis   RLS (restless legs syndrome)   BPH (benign prostatic hyperplasia)  Acute kidney injury , Likely post obstructive: Patient with history of BPH presented with hesitancy, frequency, left flank pain. CT renal study showed bilateral mild hydronephrosis. Baseline serum creatinine normal,  presented with creatinine 2.51. Patient felt improved after  catheterization. Denies any fever, no leukocytosis, UA unremarkable. Will hold on antibiotics at this time. Continue IV fluid resuscitation. Avoid nephrotoxic medications. Serum creatinine has improved.  AKI  resolved.   Bilateral hydroureteronephrosis: Patient has scheduled appointment yesterday with Dr. Mena Goes. Urology is requested to see patient while in hospital. Urology recommended to continue Foley catheter and finasteride. Patient can be discharged with Foley catheter and outpatient follow-up   Hypokalemia: Replaced.  Continue to monitor   Essential hypertension: Continue amlodipine,  Initially losartan was kept on hold for AKI but then blood pressure remains controlled.   Hyperlipidemia; Continue Crestor.   Coronary artery disease: Continue amlodipine and Crestor.   BPH: Continue Proscar.   Dementia: Continue Aricept.   Generalized weakness: PT and OT evaluation.   Abdominal lymphadenopathy: CT abdomen showed incidental findings suspicious for lymphoproliferative disorder. Needs outpatient workup.    DVT prophylaxis: Lovenox Code Status: Full code Family Communication: Son at bed side Disposition Plan:     Status is: Inpatient Remains inpatient appropriate because: Patient was discharged home yesterday but developed nausea,  not feeling comfortable going home yesterday so he has decided to go home today.    Consultants:  Urology  Procedures:CT renal study  Antimicrobials: Anti-infectives (From admission, onward)    None      Subjective: Patient was seen and examined at bedside.  Overnight events noted.   Patient reports doing much better today. He was feeling nauseous,  did not feel comfortable going home yesterday.   So he is going home today.  Objective: Vitals:   02/12/23 1224 02/12/23 2112 02/13/23 0517 02/13/23 0546  BP: (!) 145/78 130/71 (!) 179/87 (!) 153/75  Pulse: 61 65 61 (!) 57  Resp: 18 16 17  Temp: 97.6 F (36.4 C) 98.1  F (36.7 C) 97.6 F (36.4 C)   TempSrc: Oral Oral Oral   SpO2: 98% 97% 97%   Weight:      Height:        Intake/Output Summary (Last 24 hours) at 02/13/2023 1219 Last data filed at 02/13/2023 0900 Gross per 24 hour  Intake 726.05 ml  Output 2400 ml  Net -1673.95 ml   Filed Weights   02/10/23 1437 02/10/23 1442  Weight: 72.6 kg 72.5 kg    Examination:  General exam: Appears calm and comfortable, deconditioned, not in any distress Respiratory system: Clear to auscultation. Respiratory effort normal. Cardiovascular system: S1 & S2 heard, RRR. No JVD, murmurs, rubs, gallops or clicks. No pedal edema. Gastrointestinal system: Abdomen is nondistended, soft and nontender. Normal bowel sounds heard. Central nervous system: Alert and oriented. No focal neurological deficits. Extremities: Symmetric 5 x 5 power. Skin: No rashes, lesions or ulcers Psychiatry: Judgement and insight appear normal. Mood & affect appropriate.     Data Reviewed: I have personally reviewed following labs and imaging studies  CBC: Recent Labs  Lab 02/09/23 1559 02/10/23 1535 02/10/23 2103 02/11/23 0424 02/12/23 0355  WBC 12.3* 8.5 6.9 6.8 7.7  NEUTROABS 10.0* 6.4  --   --   --   HGB 16.2 15.5 13.6 13.1 12.8*  HCT 49.3 45.7 39.9 38.4* 38.4*  MCV 94.8 92.3 93.9 92.8 93.9  PLT 151.0 126* 110* 98* 90*   Basic Metabolic Panel: Recent Labs  Lab 02/09/23 1559 02/10/23 1535 02/10/23 1805 02/11/23 0424 02/12/23 0355 02/13/23 1113  NA 139 138  --  136 137 134*  K 3.2* 3.1*  --  2.9* 3.1* 3.3*  CL 98 99  --  103 104 102  CO2 26 26  --  27 27 27   GLUCOSE 118* 116*  --  97 98 137*  BUN 28* 37*  --  24* 17 8  CREATININE 1.53* 2.15* 1.79* 1.04 0.71 0.63  CALCIUM 10.0 9.5  --  8.2* 7.8* 8.1*  MG  --  2.5*  --  2.1 1.9  --   PHOS  --   --   --  3.0 2.0* 1.9*   GFR: Estimated Creatinine Clearance: 66.7 mL/min (by C-G formula based on SCr of 0.63 mg/dL). Liver Function Tests: Recent Labs  Lab  02/09/23 1559 02/10/23 1535 02/11/23 0424 02/12/23 0355  AST 16 15 15  14*  ALT 11 13 12 12   ALKPHOS 88 74 57 60  BILITOT 3.9* 3.6* 2.3* 1.5*  PROT 7.1 6.6 5.2* 5.1*  ALBUMIN 4.3 3.8 2.9* 2.9*   Recent Labs  Lab 02/10/23 1535  LIPASE 23   No results for input(s): "AMMONIA" in the last 168 hours. Coagulation Profile: No results for input(s): "INR", "PROTIME" in the last 168 hours. Cardiac Enzymes: Recent Labs  Lab 02/10/23 1805  CKTOTAL 36*   BNP (last 3 results) No results for input(s): "PROBNP" in the last 8760 hours. HbA1C: No results for input(s): "HGBA1C" in the last 72 hours. CBG: No results for input(s): "GLUCAP" in the last 168 hours. Lipid Profile: No results for input(s): "CHOL", "HDL", "LDLCALC", "TRIG", "CHOLHDL", "LDLDIRECT" in the last 72 hours. Thyroid Function Tests: No results for input(s): "TSH", "T4TOTAL", "FREET4", "T3FREE", "THYROIDAB" in the last 72 hours. Anemia Panel: No results for input(s): "VITAMINB12", "FOLATE", "FERRITIN", "TIBC", "IRON", "RETICCTPCT" in the last 72 hours. Sepsis Labs: No results for input(s): "PROCALCITON", "LATICACIDVEN" in the last 168 hours.  Recent Results (  from the past 240 hour(s))  Culture, Urine     Status: None   Collection Time: 02/09/23  3:59 PM   Specimen: Blood  Result Value Ref Range Status   MICRO NUMBER: [id number]  Final   SPECIMEN QUALITY: Adequate  Final   Sample Source URINE  Final   STATUS: FINAL  Final   Result:   Final    Mixed genital flora isolated. These superficial bacteria are not indicative of a urinary tract infection. No further organism identification is warranted on this specimen. If clinically indicated, recollect clean-catch, mid-stream urine and transfer  immediately to Urine Culture Transport Tube.     Radiology Studies: No results found.  Scheduled Meds:  amLODipine  5 mg Oral Daily   Chlorhexidine Gluconate Cloth  6 each Topical Daily   clopidogrel  75 mg Oral Daily    docusate sodium  100 mg Oral BID   donepezil  5 mg Oral QHS   finasteride  5 mg Oral Daily   heparin  5,000 Units Subcutaneous Q8H   nebivolol  2.5 mg Oral Daily   phosphorus  250 mg Oral TID   potassium chloride  40 mEq Oral Once   rosuvastatin  40 mg Oral Daily   Continuous Infusions:  sodium chloride Stopped (02/12/23 0900)   lactated ringers Stopped (02/11/23 0044)     LOS: 3 days    Time spent: 35 mins    Willeen Niece, MD [facility]   If 7PM-7AM, please contact night-coverage

## 2023-02-13 NOTE — Plan of Care (Signed)
  Problem: Clinical Measurements: Goal: Ability to maintain clinical measurements within normal limits will improve Outcome: Progressing Goal: Diagnostic test results will improve Outcome: Progressing   Problem: Safety: Goal: Ability to remain free from injury will improve Outcome: Progressing   

## 2023-02-15 ENCOUNTER — Telehealth: Payer: Self-pay

## 2023-02-15 NOTE — Transitions of Care (Post Inpatient/ED Visit) (Signed)
02/15/2023  Name: Willie Lynch MRN: [id number] DOB: October 10, 1933  Today's TOC FU Call Status: Today's TOC FU Call Status:: Successful TOC FU Call Completed TOC FU Call Complete Date: 02/15/23 (Call completed with son-Mike) Patient's Name and Date of Birth confirmed.  Transition Care Management Follow-up Telephone Call Date of Discharge: 02/13/23 Discharge Facility: [facility]) Type of Discharge: Inpatient Admission Primary Inpatient Discharge Diagnosis:: "urinary retention" How have you been since you were released from the hospital?: Better (Pt "had better night last night than first night"-increased dementia/confusion due to change in environment-having some pain to penis area from catheter-foley draining clear,yellow urine-no issues. LBM yest. Appetite good. Pt has been up walking some.) Any questions or concerns?: No  Items Reviewed: Did you receive and understand the discharge instructions provided?: Yes Medications obtained,verified, and reconciled?: Yes (Medications Reviewed) (son states Phosphorus being delivered today) Any new allergies since your discharge?: No Dietary orders reviewed?: Yes Type of Diet Ordered:: low salt/heart healthy Do you have support at home?: Yes People in Home: child(ren), adult Name of Support/Comfort Primary Source: son assisting with pt care  Medications Reviewed Today: Medications Reviewed Today     Reviewed by Charlyn Minerva, RN (Registered Nurse) on 02/15/23 at 1257  Med List Status: <None>   Medication Order Taking? Sig Documenting Provider Last Dose Status Informant  amLODipine (NORVASC) 5 MG tablet [id number] Yes TAKE 1 TABLET BY MOUTH EVERY DAY  Patient taking differently: Take 5 mg by mouth every evening.   Karie Georges, MD Taking Active Family Member  clopidogrel (PLAVIX) 75 MG tablet [id number] Yes TAKE 1 TABLET(75 MG) BY MOUTH DAILY Karie Georges, MD Taking Active   cyanocobalamin (VITAMIN B12) 500 MCG  tablet [id number] Yes Take 1 tablet (500 mcg total) by mouth daily.  Patient taking differently: Take 500 mcg by mouth every evening.   Karie Georges, MD Taking Active Family Member  donepezil (ARICEPT) 10 MG tablet [id number] Yes TAKE 1 TABLET(10 MG) BY MOUTH AT BEDTIME  Patient taking differently: Take 10 mg by mouth every evening.   Karie Georges, MD Taking Active Family Member  escitalopram [facility]) 5 MG tablet [id number] Yes Take 1 tablet (5 mg total) by mouth daily.  Patient taking differently: Take 5 mg by mouth every evening.   Karie Georges, MD Taking Active Family Member  finasteride (PROSCAR) 5 MG tablet [id number] Yes Take 1 tablet (5 mg total) by mouth daily.  Patient taking differently: Take 5 mg by mouth every evening.   Karie Georges, MD Taking Active Family Member  nebivolol (BYSTOLIC) 2.5 MG tablet [id number] Yes Take 1 tablet (2.5 mg total) by mouth daily.  Patient taking differently: Take 2.5 mg by mouth every evening.   Karie Georges, MD Taking Active Family Member  nitroGLYCERIN (NITROSTAT) 0.4 MG SL tablet [id number] Yes DISSOLVE 1 TABLET UNDER THE TONGUE IF NEEDED AS DIRECTED.  Patient taking differently: Place 0.4 mg under the tongue as needed for chest pain (as directed).   Wynn Banker, MD Taking Active Family Member  ondansetron [facility]) 4 MG tablet [id number] Yes Take 1 tablet (4 mg total) by mouth every 6 (six) hours as needed for nausea or vomiting. Karie Georges, MD Taking Active Family Member  phosphorus (K PHOS NEUTRAL) [phone] MG tablet [id number]  Take 1 tablet (250 mg total) by mouth 3 (three) times daily for 3 days. Willeen Niece, MD  Active   rosuvastatin (CRESTOR) 40 MG tablet [id number] Yes Take 1  tablet (40 mg total) by mouth daily.  Patient taking differently: Take 40 mg by mouth every evening.   Karie Georges, MD Taking Active Family Member  TYLENOL 500 MG tablet [id number] Yes Take 500-1,000 mg by mouth every 6  (six) hours as needed for mild pain or headache. [provider] Taking Active Family Member            Home Care and Equipment/Supplies: Were Home Health Services Ordered?: NA Any new equipment or medical supplies ordered?: NA  Functional Questionnaire: Do you need assistance with bathing/showering or dressing?: Yes (family assists with ADLs/IADLs) Do you need assistance with meal preparation?: Yes Do you need assistance with eating?: No Do you have difficulty maintaining continence: No Do you need assistance with getting out of bed/getting out of a chair/moving?: Yes Do you have difficulty managing or taking your medications?: Yes (family assists with meds)  Follow up appointments reviewed: PCP Follow-up appointment confirmed?: No (Care guide attempted to assist with scheduling appt during call-no openings with PCP until Oct-son did not want to see another provider-will call office himself to discuss appt and get appt scheduled) MD Provider Line Number:[phone] Given: No Specialist Hospital Follow-up appointment confirmed?: Yes Date of Specialist follow-up appointment?: 02/26/23 Follow-Up Specialty Provider:: Dr. Mena Goes Do you need transportation to your follow-up appointment?: No (son confirms family takes pt to appts) Do you understand care options if your condition(s) worsen?: Yes-patient verbalized understanding  SDOH Interventions Today    Flowsheet Row Most Recent Value  SDOH Interventions   Food Insecurity Interventions Intervention Not Indicated  Transportation Interventions Intervention Not Indicated      TOC Interventions Today    Flowsheet Row Most Recent Value  TOC Interventions   TOC Interventions Discussed/Reviewed TOC Interventions Discussed      Interventions Today    Flowsheet Row Most Recent Value  Chronic Disease   Chronic disease during today's visit Hypertension (HTN)  General Interventions   General Interventions  Discussed/Reviewed General Interventions Discussed, Doctor Visits, Durable Medical Equipment (DME)  Doctor Visits Discussed/Reviewed Doctor Visits Discussed, PCP, Specialist  Durable Medical Equipment (DME) BP Cuff  [son confirms that they have BP machine in the home-have not checked BP since returning home-encouraged to start monitoring at least once a day]  PCP/Specialist Visits Compliance with follow-up visit  Education Interventions   Education Provided Provided Education  Provided Verbal Education On Nutrition, When to see the doctor, Other  [foley mgmt]  Nutrition Interventions   Nutrition Discussed/Reviewed Nutrition Discussed  Pharmacy Interventions   Pharmacy Dicussed/Reviewed Pharmacy Topics Discussed, Medications and their functions  Safety Interventions   Safety Discussed/Reviewed Safety Discussed, Home Safety       Alessandra Grout [facility] Care Management Care Management Community Coordinator Direct Phone: [phone] Toll Free: [phone] Fax: [phone]

## 2023-02-24 ENCOUNTER — Encounter (HOSPITAL_COMMUNITY): Payer: Self-pay

## 2023-02-24 ENCOUNTER — Emergency Department (HOSPITAL_COMMUNITY)
Admission: EM | Admit: 2023-02-24 | Discharge: 2023-02-24 | Disposition: A | Discharge status: Home / Self Care | Payer: Medicare Other | Attending: Emergency Medicine | Admitting: Emergency Medicine

## 2023-02-24 ENCOUNTER — Other Ambulatory Visit: Payer: Self-pay

## 2023-02-24 DIAGNOSIS — Y732 Prosthetic and other implants, materials and accessory gastroenterology and urology devices associated with adverse incidents: Secondary | ICD-10-CM | POA: Diagnosis not present

## 2023-02-24 DIAGNOSIS — T839XXA Unspecified complication of genitourinary prosthetic device, implant and graft, initial encounter: Secondary | ICD-10-CM

## 2023-02-24 DIAGNOSIS — Z7902 Long term (current) use of antithrombotics/antiplatelets: Secondary | ICD-10-CM | POA: Insufficient documentation

## 2023-02-24 DIAGNOSIS — T83091A Other mechanical complication of indwelling urethral catheter, initial encounter: Secondary | ICD-10-CM | POA: Insufficient documentation

## 2023-02-24 DIAGNOSIS — Z743 Need for continuous supervision: Secondary | ICD-10-CM | POA: Diagnosis not present

## 2023-02-24 DIAGNOSIS — N4889 Other specified disorders of penis: Secondary | ICD-10-CM | POA: Diagnosis present

## 2023-02-24 DIAGNOSIS — T83198A Other mechanical complication of other urinary devices and implants, initial encounter: Secondary | ICD-10-CM | POA: Diagnosis not present

## 2023-02-24 LAB — URINALYSIS, ROUTINE W REFLEX MICROSCOPIC

## 2023-02-24 LAB — URINALYSIS, MICROSCOPIC (REFLEX): RBC / HPF: >50 RBC/hpf (ref 0–5)

## 2023-02-24 NOTE — Discharge Instructions (Signed)
You were seen this morning due to a Foley catheter problem.  This was replaced.  Please keep your upcoming appointment with urology.  If you develop inability to urinate or other emergent symptoms please return to the emergency department.

## 2023-02-24 NOTE — ED Triage Notes (Signed)
Pt BIB [facility] from home after having catheter complications and now having pain w/ hematuria.

## 2023-02-24 NOTE — ED Provider Notes (Signed)
[facility] EMERGENCY DEPARTMENT AT [facility] Provider Note   CSN: [id number] Arrival date & time: 02/24/23  0348     History  Chief Complaint  Patient presents with   Urinary Retention   Hematuria    Willie Lynch is a 89 y.o. male.  Patient presents to the emergency department via EMS complaining of penile pain.  Patient has a Foley catheter that was placed approximately 2 weeks ago due to urinary retention.  He has had normal output through the catheter since the placement.  He had normal output earlier tonight.  He woke up at 2 in the morning complaining of pain and called his son.  His son subsequently called EMS for transport to the emergency department.  Patient denies abdominal pain, nausea, vomiting, fevers.  Past medical history significant for elevated PSA, BPH, AKI, IBS, remote history cardiac stents   Hematuria       Home Medications Prior to Admission medications   Medication Sig Start Date End Date Taking? Authorizing Provider  amLODipine (NORVASC) 5 MG tablet TAKE 1 TABLET BY MOUTH EVERY DAY Patient taking differently: Take 5 mg by mouth every evening. 02/01/23   Karie Georges, MD  clopidogrel (PLAVIX) 75 MG tablet TAKE 1 TABLET(75 MG) BY MOUTH DAILY 08/03/22   Karie Georges, MD  cyanocobalamin (VITAMIN B12) 500 MCG tablet Take 1 tablet (500 mcg total) by mouth daily. Patient taking differently: Take 500 mcg by mouth every evening. 02/01/23   Karie Georges, MD  donepezil (ARICEPT) 10 MG tablet TAKE 1 TABLET(10 MG) BY MOUTH AT BEDTIME Patient taking differently: Take 10 mg by mouth every evening. 02/01/23   Karie Georges, MD  escitalopram (LEXAPRO) 5 MG tablet Take 1 tablet (5 mg total) by mouth daily. Patient taking differently: Take 5 mg by mouth every evening. 02/01/23   Karie Georges, MD  finasteride (PROSCAR) 5 MG tablet Take 1 tablet (5 mg total) by mouth daily. Patient taking differently: Take 5 mg by mouth every evening.  02/01/23   Karie Georges, MD  nebivolol (BYSTOLIC) 2.5 MG tablet Take 1 tablet (2.5 mg total) by mouth daily. Patient taking differently: Take 2.5 mg by mouth every evening. 02/01/23   Karie Georges, MD  nitroGLYCERIN (NITROSTAT) 0.4 MG SL tablet DISSOLVE 1 TABLET UNDER THE TONGUE IF NEEDED AS DIRECTED. Patient taking differently: Place 0.4 mg under the tongue as needed for chest pain (as directed). 09/30/20   Wynn Banker, MD  ondansetron (ZOFRAN) 4 MG tablet Take 1 tablet (4 mg total) by mouth every 6 (six) hours as needed for nausea or vomiting. 02/09/23   Karie Georges, MD  rosuvastatin (CRESTOR) 40 MG tablet Take 1 tablet (40 mg total) by mouth daily. Patient taking differently: Take 40 mg by mouth every evening. 02/01/23   Karie Georges, MD  TYLENOL 500 MG tablet Take 500-1,000 mg by mouth every 6 (six) hours as needed for mild pain or headache.    [provider]      Allergies    Amoxicillin-pot clavulanate, Codeine, and Morphine    Review of Systems   Review of Systems  Genitourinary:  Positive for hematuria.    Physical Exam Updated Vital Signs There were no vitals taken for this visit. Physical Exam Vitals and nursing note reviewed. Exam conducted with a chaperone present.  HENT:     Head: Normocephalic and atraumatic.  Eyes:     Conjunctiva/sclera: Conjunctivae normal.  Cardiovascular:  Rate and Rhythm: Normal rate.  Pulmonary:     Effort: Pulmonary effort is normal. No respiratory distress.  Abdominal:     General: There is no distension.     Palpations: Abdomen is soft.     Tenderness: There is no abdominal tenderness.  Genitourinary:    Penis: Normal.      Comments: Small amount of blood around urethral meatus Musculoskeletal:        General: No signs of injury.     Cervical back: Normal range of motion.  Skin:    General: Skin is dry.  Neurological:     Mental Status: He is alert.  Psychiatric:        Speech: Speech  normal.        Behavior: Behavior normal.     ED Results / Procedures / Treatments   Labs (all labs ordered are listed, but only abnormal results are displayed) Labs Reviewed  URINALYSIS, ROUTINE W REFLEX MICROSCOPIC - Abnormal; Notable for the following components:      Result Value   Color, Urine RED (*)    APPearance TURBID (*)    Glucose, UA   (*)    Value: TEST NOT REPORTED DUE TO COLOR INTERFERENCE OF URINE PIGMENT   Hgb urine dipstick   (*)    Value: TEST NOT REPORTED DUE TO COLOR INTERFERENCE OF URINE PIGMENT   Bilirubin Urine   (*)    Value: TEST NOT REPORTED DUE TO COLOR INTERFERENCE OF URINE PIGMENT   Ketones, ur   (*)    Value: TEST NOT REPORTED DUE TO COLOR INTERFERENCE OF URINE PIGMENT   Protein, ur   (*)    Value: TEST NOT REPORTED DUE TO COLOR INTERFERENCE OF URINE PIGMENT   Nitrite   (*)    Value: TEST NOT REPORTED DUE TO COLOR INTERFERENCE OF URINE PIGMENT   Leukocytes,Ua   (*)    Value: TEST NOT REPORTED DUE TO COLOR INTERFERENCE OF URINE PIGMENT   All other components within normal limits  URINALYSIS, MICROSCOPIC (REFLEX) - Abnormal; Notable for the following components:   Bacteria, UA FEW (*)    All other components within normal limits    EKG None  Radiology No results found.  Procedures Procedures    Medications Ordered in ED Medications - No data to display  ED Course/ Medical Decision Making/ A&P                                 Medical Decision Making Amount and/or Complexity of Data Reviewed Labs: ordered.   This patient presents to the ED for concern of penile pain, this involves an extensive number of treatment options, and is a complaint that carries with it a high risk of complications and morbidity.  The differential diagnosis includes dislodged Foley catheter, urethral trauma, others   Co morbidities that complicate the patient evaluation  Foley catheter in place   Additional history obtained:  Additional history  obtained from EMS and patient's son External records from outside source obtained and reviewed including primary care notes showing history of memory impairment   Lab Tests:  I Ordered, and personally interpreted labs.  The pertinent results include: UA with turbid appearance   Social Determinants of Health:  Patient lives at home, patient has one of his sons within 24 hours a day   Test / Admission - Considered:  Patient feels better after catheter replacement.  I  feel that most likely the patient accidentally tugged on the catheter, partially dislodging and causing the pain.  Urine is flowing at this time.  He does have gross hematuria which was not present prior to the initial onset of pain.  Plan to have patient follow-up with urology at his upcoming appointment on Friday.  Return precautions provided including inability to urinate.         Final Clinical Impression(s) / ED Diagnoses Final diagnoses:  Problem with Foley catheter, initial encounter [facility])    Rx / DC Orders ED Discharge Orders     None         Pamala Duffel 02/24/23 1610    Gilda Crease, MD 02/24/23 [id number]

## 2023-02-26 DIAGNOSIS — R338 Other retention of urine: Secondary | ICD-10-CM | POA: Diagnosis not present

## 2023-03-01 ENCOUNTER — Ambulatory Visit (INDEPENDENT_AMBULATORY_CARE_PROVIDER_SITE_OTHER): Payer: Medicare Other | Admitting: Family Medicine

## 2023-03-01 ENCOUNTER — Encounter: Payer: Self-pay | Admitting: Family Medicine

## 2023-03-01 DIAGNOSIS — R1114 Bilious vomiting: Secondary | ICD-10-CM | POA: Diagnosis not present

## 2023-03-01 MED ORDER — ONDANSETRON HCL 4 MG PO TABS
4.0000 mg | ORAL_TABLET | Freq: Four times a day (QID) | ORAL | 2 refills | Status: DC | PRN
Start: 2023-03-01 — End: 2023-04-14

## 2023-03-01 NOTE — Progress Notes (Unsigned)
Established Patient Office Visit  Subjective   Patient ID: Willie Lynch, male    DOB: 1933-09-28  Age: 89 y.o. MRN: [id number]  Chief Complaint  Patient presents with   Medical Management of Chronic Issues    Patient is here for hospital follow up. He conitnues to wear a foley catheter to help drain his bladder. Sons are present in the visit. Foley catheter remains in place. Patient continue to report pain in his back and also nausea. BP is low today, we discussed stopping the amlodipine and checking BP at home daily. Pt is eating some, drinking. I reviewed his discharge documentation and labs, he has an appt with the urologist soon to follow up.   Work up in the hospital was negative for infection and his creatinine returned to baseline with treatment with the foley catheter.   Current Outpatient Medications  Medication Instructions   alfuzosin (UROXATRAL) 10 mg, Oral, Daily   amLODipine (NORVASC) 5 MG tablet TAKE 1 TABLET BY MOUTH EVERY DAY   clopidogrel (PLAVIX) 75 MG tablet TAKE 1 TABLET(75 MG) BY MOUTH DAILY   cyanocobalamin (VITAMIN B12) 500 mcg, Oral, Daily   donepezil (ARICEPT) 10 MG tablet TAKE 1 TABLET(10 MG) BY MOUTH AT BEDTIME   escitalopram (LEXAPRO) 5 mg, Oral, Daily   finasteride (PROSCAR) 5 mg, Oral, Daily,     nebivolol (BYSTOLIC) 2.5 mg, Oral, Daily   nitroGLYCERIN (NITROSTAT) 0.4 MG SL tablet DISSOLVE 1 TABLET UNDER THE TONGUE IF NEEDED AS DIRECTED.   ondansetron (ZOFRAN) 4 mg, Oral, Every 6 hours PRN   Phenazopyridine HCl (AZO URINARY PAIN PO) Oral   rosuvastatin (CRESTOR) 40 mg, Oral, Daily   tamsulosin (FLOMAX) 0.4 mg, Oral, Daily at bedtime   TYLENOL 500-1,000 mg, Oral, Every 6 hours PRN    Patient Active Problem List   Diagnosis Date Noted   AKI (acute kidney injury) (HCC) 02/10/2023   Low serum vitamin B12 02/01/2023   Gallstone 05/21/2016   BPH (benign prostatic hyperplasia) 11/08/2013   GERD (gastroesophageal reflux disease) 07/08/2012   RLS  (restless legs syndrome) 07/08/2012   ED (erectile dysfunction) 01/08/2012   Dyspnea 02/18/2011   Chest pain 12/09/2010   ISCHEMIC CARDIOMYOPATHY 08/09/2009   Fatigue 02/05/2009   HYPERCHOLESTEROLEMIA 11/12/2008   Essential hypertension 11/12/2008   CORONARY ATHEROSCLEROSIS NATIVE CORONARY ARTERY 11/12/2008   ALLERGIC URTICARIA 11/24/2007   DIVERTICULOSIS OF COLON 08/23/2007   RENAL CYST 08/23/2007   LOW BACK PAIN, CHRONIC 08/23/2007   ELEVATED PROSTATE SPECIFIC ANTIGEN 08/23/2007   Allergic rhinitis 07/22/2007   IRRITABLE BOWEL SYNDROME 07/22/2007   Osteoarthritis 07/22/2007      Review of Systems  All other systems reviewed and are negative.     Objective:     BP (!) 90/50 (BP Location: Right Arm, Patient Position: Sitting, Cuff Size: Normal)   Pulse (!) 55   Temp (!) 97.5 F (36.4 C) (Oral)   Ht 5\' 11"  (1.803 m)   Wt 153 lb 4.8 oz (69.5 kg)   SpO2 97%   BMI 21.38 kg/m    Physical Exam Vitals reviewed.  Constitutional:      General: He is in acute distress (mild distress secondary to pain-- looks uncomfortable).     Appearance: Normal appearance. He is normal weight.  Cardiovascular:     Rate and Rhythm: Normal rate and regular rhythm.     Pulses: Normal pulses.     Heart sounds: Normal heart sounds. No murmur heard. Pulmonary:     Effort: Pulmonary effort  is normal.  Abdominal:     General: Bowel sounds are normal.     Palpations: Abdomen is soft.  Neurological:     Mental Status: He is alert. Mental status is at baseline.      No results found for any visits on 03/01/23.  Last metabolic panel Lab Results  Component Value Date   GLUCOSE 137 (H) 02/13/2023   NA 134 (L) 02/13/2023   K 3.3 (L) 02/13/2023   CL 102 02/13/2023   CO2 27 02/13/2023   BUN 8 02/13/2023   CREATININE 0.63 02/13/2023   GFRNONAA >60 02/13/2023   CALCIUM 8.1 (L) 02/13/2023   PHOS 1.9 (L) 02/13/2023   PROT 5.1 (L) 02/12/2023   ALBUMIN 2.9 (L) 02/12/2023   BILITOT 1.5 (H)  02/12/2023   ALKPHOS 60 02/12/2023   AST 14 (L) 02/12/2023   ALT 12 02/12/2023   ANIONGAP 5 02/13/2023      The ASCVD Risk score (Arnett DK, et al., 2019) failed to calculate for the following reasons:   The 2019 ASCVD risk score is only valid for ages 62 to 43    Assessment & Plan:  Bilious vomiting with nausea -     Ondansetron HCl; Take 1 tablet (4 mg total) by mouth every 6 (six) hours as needed for nausea or vomiting.  Dispense: 30 tablet; Refill: 2   Nausea is most likely du to the low blood pressure/ could be from having a foley in place as well. I have refilled his ondansetron. I instructed the sons to stop his amlodipine and check his blood pressures daily -- I wrote this out for them as well. I will see him back in the office in 3 months for follow up.  Return in about 3 months (around 05/31/2023) for HTN.    Karie Georges, MD

## 2023-03-01 NOTE — Patient Instructions (Addendum)
Reduce amlodipine to 1/2 tablet daily OR you might try stopping the amlodipine-- check blood pressure daily, -- goal blood pressure should be less than 140/90

## 2023-03-11 DIAGNOSIS — R338 Other retention of urine: Secondary | ICD-10-CM | POA: Diagnosis not present

## 2023-03-12 DIAGNOSIS — R3915 Urgency of urination: Secondary | ICD-10-CM | POA: Diagnosis not present

## 2023-03-12 DIAGNOSIS — R338 Other retention of urine: Secondary | ICD-10-CM | POA: Diagnosis not present

## 2023-03-14 ENCOUNTER — Emergency Department (HOSPITAL_COMMUNITY)
Admission: EM | Admit: 2023-03-14 | Discharge: 2023-03-14 | Disposition: A | Discharge status: Home / Self Care | Payer: Medicare Other | Attending: Emergency Medicine | Admitting: Emergency Medicine

## 2023-03-14 ENCOUNTER — Other Ambulatory Visit: Payer: Self-pay

## 2023-03-14 ENCOUNTER — Encounter (HOSPITAL_COMMUNITY): Payer: Self-pay | Admitting: *Deleted

## 2023-03-14 ENCOUNTER — Emergency Department (HOSPITAL_COMMUNITY): Payer: Medicare Other

## 2023-03-14 DIAGNOSIS — R519 Headache, unspecified: Secondary | ICD-10-CM | POA: Diagnosis not present

## 2023-03-14 DIAGNOSIS — Z79899 Other long term (current) drug therapy: Secondary | ICD-10-CM | POA: Insufficient documentation

## 2023-03-14 DIAGNOSIS — S0990XA Unspecified injury of head, initial encounter: Secondary | ICD-10-CM | POA: Diagnosis not present

## 2023-03-14 DIAGNOSIS — W19XXXA Unspecified fall, initial encounter: Secondary | ICD-10-CM

## 2023-03-14 DIAGNOSIS — W182XXA Fall in (into) shower or empty bathtub, initial encounter: Secondary | ICD-10-CM | POA: Diagnosis not present

## 2023-03-14 DIAGNOSIS — Y92002 Bathroom of unspecified non-institutional (private) residence single-family (private) house as the place of occurrence of the external cause: Secondary | ICD-10-CM | POA: Insufficient documentation

## 2023-03-14 DIAGNOSIS — I10 Essential (primary) hypertension: Secondary | ICD-10-CM | POA: Diagnosis not present

## 2023-03-14 DIAGNOSIS — M542 Cervicalgia: Secondary | ICD-10-CM | POA: Diagnosis not present

## 2023-03-14 MED ORDER — ACETAMINOPHEN 325 MG PO TABS
650.0000 mg | ORAL_TABLET | Freq: Once | ORAL | Status: DC
  Filled 2023-03-14: qty 2

## 2023-03-14 MED ORDER — ONDANSETRON 4 MG PO TBDP
4.0000 mg | ORAL_TABLET | Freq: Once | ORAL | Status: AC
  Administered 2023-03-14: 4 mg via ORAL
  Filled 2023-03-14: qty 1

## 2023-03-14 NOTE — Discharge Instructions (Signed)
Your CT scan is reassuring.  No evidence of broken bone or bleeding in the brain.  Take Tylenol as needed for headache.  Return to the ED for worsening pain, confusion, vomiting, weakness, numbness, tingling or other concerns.

## 2023-03-14 NOTE — ED Notes (Signed)
Pt ambulated down the hall with a steady gait

## 2023-03-14 NOTE — ED Triage Notes (Signed)
Pt reporting that tonight he slipped and fell in the bathroom, possibly on the wet floor, striking the back of his head on the tile. He c/o head pain, no neck pain. No LOC, no blood thinners. Has had some nausea. He was able to get up by himself and went to bed.

## 2023-03-14 NOTE — ED Provider Notes (Signed)
[facility] [place] [facility] EMERGENCY DEPARTMENT AT [facility] Provider Note   CSN: [id number] Arrival date & time: 03/14/23  0012     History  Chief Complaint  Patient presents with   Willie Lynch is a 89 y.o. male.  Patient presents with head injury.  States he was getting out of the shower when he slipped and fell backwards striking his head on a carpeted surface.  This was heard by his family but not maintenance.  No loss of consciousness.  Complains of a headache and some neck pain.  He has been on Plavix previously but not taking it for about 5 weeks.  Does have a Foley catheter.  Denies any preceding dizziness or lightheadedness.  Denies any chest pain or shortness of breath.  No abdominal pain, nausea or vomiting.  Some lightheadedness after the fall and nausea.  No nausea, dizziness or lightheadedness prior to the fall.  No syncope. No focal weakness, numbness or tingling.  No chest pain or shortness of breath.  The history is provided by the patient and a relative.  Fall Associated symptoms include headaches. Pertinent negatives include no chest pain, no abdominal pain and no shortness of breath.       Home Medications Prior to Admission medications   Medication Sig Start Date End Date Taking? Authorizing Provider  alfuzosin (UROXATRAL) 10 MG 24 hr tablet Take 10 mg by mouth daily. 02/28/23   [provider]  amLODipine (NORVASC) 5 MG tablet TAKE 1 TABLET BY MOUTH EVERY DAY Patient taking differently: Take 5 mg by mouth every evening. 02/01/23   Karie Georges, MD  clopidogrel (PLAVIX) 75 MG tablet TAKE 1 TABLET(75 MG) BY MOUTH DAILY 08/03/22   Karie Georges, MD  cyanocobalamin (VITAMIN B12) 500 MCG tablet Take 1 tablet (500 mcg total) by mouth daily. Patient taking differently: Take 500 mcg by mouth every evening. 02/01/23   Karie Georges, MD  donepezil (ARICEPT) 10 MG tablet TAKE 1 TABLET(10 MG) BY MOUTH AT BEDTIME Patient taking differently:  Take 10 mg by mouth every evening. 02/01/23   Karie Georges, MD  escitalopram (LEXAPRO) 5 MG tablet Take 1 tablet (5 mg total) by mouth daily. Patient taking differently: Take 5 mg by mouth every evening. 02/01/23   Karie Georges, MD  finasteride (PROSCAR) 5 MG tablet Take 1 tablet (5 mg total) by mouth daily. Patient taking differently: Take 5 mg by mouth every evening. 02/01/23   Karie Georges, MD  nebivolol (BYSTOLIC) 2.5 MG tablet Take 1 tablet (2.5 mg total) by mouth daily. Patient taking differently: Take 2.5 mg by mouth every evening. 02/01/23   Karie Georges, MD  nitroGLYCERIN (NITROSTAT) 0.4 MG SL tablet DISSOLVE 1 TABLET UNDER THE TONGUE IF NEEDED AS DIRECTED. Patient taking differently: Place 0.4 mg under the tongue as needed for chest pain (as directed). 09/30/20   Koberlein, Paris Lore, MD  ondansetron (ZOFRAN) 4 MG tablet Take 1 tablet (4 mg total) by mouth every 6 (six) hours as needed for nausea or vomiting. 03/01/23   Karie Georges, MD  Phenazopyridine HCl (AZO URINARY PAIN PO) Take by mouth.    [provider]  rosuvastatin (CRESTOR) 40 MG tablet Take 1 tablet (40 mg total) by mouth daily. Patient taking differently: Take 40 mg by mouth every evening. 02/01/23   Karie Georges, MD  tamsulosin (FLOMAX) 0.4 MG CAPS capsule Take 0.4 mg by mouth at bedtime. 02/16/23   [provider]  TYLENOL 500 MG tablet Take 500-1,000 mg by mouth every 6 (six) hours as needed for mild pain or headache.    [provider]      Allergies    Amoxicillin-pot clavulanate, Codeine, and Morphine    Review of Systems   Review of Systems  Constitutional:  Negative for activity change, appetite change, fatigue and fever.  HENT:  Negative for congestion and nosebleeds.   Respiratory:  Negative for cough, chest tightness and shortness of breath.   Cardiovascular:  Negative for chest pain.  Gastrointestinal:  Negative for abdominal pain, nausea and vomiting.   Genitourinary:  Negative for dysuria and hematuria.  Musculoskeletal:  Positive for arthralgias and myalgias.  Skin:  Negative for rash.  Neurological:  Positive for headaches. Negative for dizziness and weakness.   all other systems are negative except as noted in the HPI and PMH.    Physical Exam Updated Vital Signs BP 121/73   Pulse 64   Temp (!) 97.1 F (36.2 C) (Oral)   Resp 16   SpO2 95%  Physical Exam Vitals and nursing note reviewed.  Constitutional:      General: He is not in acute distress.    Appearance: He is well-developed.  HENT:     Head: Normocephalic.     Comments: No septal hematoma or hemotympanum    Mouth/Throat:     Pharynx: No oropharyngeal exudate.  Eyes:     Conjunctiva/sclera: Conjunctivae normal.     Pupils: Pupils are equal, round, and reactive to light.  Neck:     Comments: Diffuse paraspinal C-spine tenderness, no step Cardiovascular:     Rate and Rhythm: Normal rate and regular rhythm.     Heart sounds: Normal heart sounds. No murmur heard. Pulmonary:     Effort: Pulmonary effort is normal. No respiratory distress.     Breath sounds: Normal breath sounds.  Abdominal:     Palpations: Abdomen is soft.     Tenderness: There is no abdominal tenderness. There is no guarding or rebound.  Musculoskeletal:        General: No tenderness. Normal range of motion.     Cervical back: Normal range of motion and neck supple.     Comments: No T or L-spine tenderness, full range of motion hips bilaterally without pain  Skin:    General: Skin is warm.  Neurological:     Mental Status: He is alert and oriented to person, place, and time.     Cranial Nerves: No cranial nerve deficit.     Motor: No abnormal muscle tone.     Coordination: Coordination normal.     Comments:  5/5 strength throughout. CN 2-12 intact.Equal grip strength.   Psychiatric:        Behavior: Behavior normal.     ED Results / Procedures / Treatments   Labs (all labs ordered  are listed, but only abnormal results are displayed) Labs Reviewed - No data to display  EKG EKG Interpretation Date/Time:  Sunday March 14 2023 01:18:46 EDT Ventricular Rate:  60 PR Interval:  186 QRS Duration:  101 QT Interval:  431 QTC Calculation: 431 R Axis:   -14  Text Interpretation: Sinus rhythm No significant change was found Confirmed by Glynn Octave [phone]) on 03/14/2023 1:41:17 AM  Radiology CT Head Wo Contrast  Result Date: 03/14/2023 CLINICAL DATA:  Slipped and fell in the bathroom, struck the back of his head, head and neck pain EXAM: CT HEAD WITHOUT CONTRAST  CT CERVICAL SPINE WITHOUT CONTRAST TECHNIQUE: Multidetector CT imaging of the head and cervical spine was performed following the standard protocol without intravenous contrast. Multiplanar CT image reconstructions of the cervical spine were also generated. RADIATION DOSE REDUCTION: This exam was performed according to the departmental dose-optimization program which includes automated exposure control, adjustment of the mA and/or kV according to patient size and/or use of iterative reconstruction technique. COMPARISON:  No prior CT of the head or cervical spine, correlation is made with MRI head 04/27/2019 would FINDINGS: CT HEAD FINDINGS Brain: No evidence of acute infarct, hemorrhage, mass, mass effect, or midline shift. No hydrocephalus or extra-axial fluid collection. Vascular: No hyperdense vessel. Skull: Negative for fracture or focal lesion. Sinuses/Orbits: No acute finding. Other: The mastoid air cells are well aerated. CT CERVICAL SPINE FINDINGS Alignment: No traumatic listhesis. Skull base and vertebrae: No acute fracture or suspicious osseous lesion. Soft tissues and spinal canal: No prevertebral fluid or swelling. No visible canal hematoma. Disc levels: Degenerative changes in the cervical spine. No significant spinal canal stenosis. Upper chest: Negative. IMPRESSION: 1. No acute intracranial process. 2. No  acute fracture or traumatic listhesis in the cervical spine. Electronically Signed   By: Wiliam Ke M.D.   On: 03/14/2023 01:11   CT Cervical Spine Wo Contrast  Result Date: 03/14/2023 CLINICAL DATA:  Slipped and fell in the bathroom, struck the back of his head, head and neck pain EXAM: CT HEAD WITHOUT CONTRAST CT CERVICAL SPINE WITHOUT CONTRAST TECHNIQUE: Multidetector CT imaging of the head and cervical spine was performed following the standard protocol without intravenous contrast. Multiplanar CT image reconstructions of the cervical spine were also generated. RADIATION DOSE REDUCTION: This exam was performed according to the departmental dose-optimization program which includes automated exposure control, adjustment of the mA and/or kV according to patient size and/or use of iterative reconstruction technique. COMPARISON:  No prior CT of the head or cervical spine, correlation is made with MRI head 04/27/2019 would FINDINGS: CT HEAD FINDINGS Brain: No evidence of acute infarct, hemorrhage, mass, mass effect, or midline shift. No hydrocephalus or extra-axial fluid collection. Vascular: No hyperdense vessel. Skull: Negative for fracture or focal lesion. Sinuses/Orbits: No acute finding. Other: The mastoid air cells are well aerated. CT CERVICAL SPINE FINDINGS Alignment: No traumatic listhesis. Skull base and vertebrae: No acute fracture or suspicious osseous lesion. Soft tissues and spinal canal: No prevertebral fluid or swelling. No visible canal hematoma. Disc levels: Degenerative changes in the cervical spine. No significant spinal canal stenosis. Upper chest: Negative. IMPRESSION: 1. No acute intracranial process. 2. No acute fracture or traumatic listhesis in the cervical spine. Electronically Signed   By: Wiliam Ke M.D.   On: 03/14/2023 01:11    Procedures Procedures    Medications Ordered in ED Medications  ondansetron (ZOFRAN-ODT) disintegrating tablet 4 mg (has no administration in  time range)  acetaminophen (TYLENOL) tablet 650 mg (has no administration in time range)    ED Course/ Medical Decision Making/ A&P                                 Medical Decision Making Amount and/or Complexity of Data Reviewed Labs: ordered. Decision-making details documented in ED Course. Radiology: ordered and independent interpretation performed. Decision-making details documented in ED Course. ECG/medicine tests: ordered and independent interpretation performed. Decision-making details documented in ED Course.  Risk OTC drugs. Prescription drug management.   Mechanical fall with head injury.  No loss of consciousness.  Recent Plavix use.  No chest pain or shortness of breath.  No dizziness or lightheadedness.  Neurological exam is nonfocal.  CT head and C-spine obtained in triage are negative for acute traumatic injury.  No hemorrhage.  No fracture.  Results reviewed and interpreted by me.  EKG is sinus rhythm without acute ST changes.  No Brugada, no prolonged QT.  Patient able to ambulate without dizziness or lightheadedness.  He feels back to baseline.  Denies any other pain.  Denies feeling dizzy or lightheaded.  Suspect head injury with concussion.  No obvious other trauma.  Appears stable for discharge back home with anti-inflammatories and Tylenol.  Follow-up with his primary doctor.  Return to the ED for worsening headache, vomiting, behavior change, confusion, or other concerns.        Final Clinical Impression(s) / ED Diagnoses Final diagnoses:  Fall, initial encounter  Injury of head, initial encounter    Rx / DC Orders ED Discharge Orders     None         Kahne Helfand, Jeannett Senior, MD 03/14/23 0157

## 2023-03-23 ENCOUNTER — Telehealth: Payer: Self-pay

## 2023-03-23 NOTE — Telephone Encounter (Signed)
Transition Care Management Unsuccessful Follow-up Telephone Call  Date of discharge and from where:  Willie Lynch 9/4  Attempts:  1st Attempt  Reason for unsuccessful TCM follow-up call:  No answer/busy   Willie Lynch [place]  [facility], [facility] Inpatient Guide, Phone: [phone] Website: [email or web address]

## 2023-03-26 ENCOUNTER — Telehealth: Payer: Self-pay

## 2023-03-26 NOTE — Telephone Encounter (Signed)
Transition Care Management Unsuccessful Follow-up Telephone Call  Date of discharge and from where:  Willie Lynch 9/4  Attempts:  2nd Attempt  Reason for unsuccessful TCM follow-up call:  No answer/busy   Willie Lynch  [facility], [facility] Guide, Phone: [phone] Website: [email or web address]

## 2023-03-31 DIAGNOSIS — R338 Other retention of urine: Secondary | ICD-10-CM | POA: Diagnosis not present

## 2023-04-12 DIAGNOSIS — R338 Other retention of urine: Secondary | ICD-10-CM | POA: Diagnosis not present

## 2023-04-13 ENCOUNTER — Telehealth: Payer: Self-pay | Admitting: Family Medicine

## 2023-04-13 DIAGNOSIS — R1114 Bilious vomiting: Secondary | ICD-10-CM

## 2023-04-13 NOTE — Telephone Encounter (Signed)
Son says patient is experiencing bad bouts of nausea and his ondansetron (ZOFRAN) 4 MG tablet is not helping. Seeking something stronger if possible that won't interact with his meds. Requests a call if needed

## 2023-04-14 MED ORDER — ONDANSETRON HCL 8 MG PO TABS: 8.0000 mg | ORAL_TABLET | Freq: Four times a day (QID) | ORAL | 2 refills | Status: AC | PRN

## 2023-04-14 NOTE — Telephone Encounter (Signed)
They can increase him to 2 tablets of the 4 mg to see if this will help, other anti-nausea medications will make him possibly very drowsy and increase his risk of falls. I will call in the 8 mg of the ondansetron to see if this will help.

## 2023-04-14 NOTE — Telephone Encounter (Signed)
Spoke with Kathlene November and informed him of the message below.

## 2023-04-14 NOTE — Addendum Note (Signed)
Addended by: Karie Georges on: 04/14/2023 12:48 PM   Modules accepted: Orders

## 2023-04-15 DIAGNOSIS — R338 Other retention of urine: Secondary | ICD-10-CM | POA: Diagnosis not present

## 2023-04-16 ENCOUNTER — Telehealth: Payer: Self-pay

## 2023-04-16 NOTE — Telephone Encounter (Signed)
Transition Care Management Unsuccessful Follow-up Telephone Call  Date of discharge and from where:  03/14/2023 [facility]  Attempts:  1st Attempt  Reason for unsuccessful TCM follow-up call:  Left voice message  Zaviyar Rahal [facility]  [facility], [facility] Resource Care Guide Direct Dial: [phone]  Website: [email or web address]

## 2023-04-16 NOTE — Telephone Encounter (Signed)
Transition Care Management Unsuccessful Follow-up Telephone Call  Date of discharge and from where:  03/14/2023 [facility]  Attempts:  2nd Attempt  Reason for unsuccessful TCM follow-up call:  Left voice message  [facility]  [facility], [facility] Resource Care Guide Direct Dial: [phone]  Website: [email or web address]

## 2023-04-18 ENCOUNTER — Encounter: Payer: Self-pay | Admitting: Family Medicine

## 2023-04-18 DIAGNOSIS — R1114 Bilious vomiting: Secondary | ICD-10-CM

## 2023-04-20 ENCOUNTER — Ambulatory Visit: Payer: Medicare Other

## 2023-04-20 ENCOUNTER — Encounter: Payer: Self-pay | Admitting: Family Medicine

## 2023-04-20 ENCOUNTER — Ambulatory Visit (INDEPENDENT_AMBULATORY_CARE_PROVIDER_SITE_OTHER): Payer: Medicare Other | Admitting: Family Medicine

## 2023-04-20 VITALS — BP 128/68 | HR 55 | Temp 97.5°F | Ht 71.0 in | Wt 144.9 lb

## 2023-04-20 DIAGNOSIS — R634 Abnormal weight loss: Secondary | ICD-10-CM | POA: Diagnosis not present

## 2023-04-20 DIAGNOSIS — R110 Nausea: Secondary | ICD-10-CM

## 2023-04-20 LAB — CBC WITH DIFFERENTIAL/PLATELET
Basophils Absolute: 0.1 10*3/uL (ref 0.0–0.1)
Basophils Relative: 0.7 % (ref 0.0–3.0)
Eosinophils Absolute: 0.2 10*3/uL (ref 0.0–0.7)
Eosinophils Relative: 2.3 % (ref 0.0–5.0)
HCT: 36.1 % — ABNORMAL LOW (ref 39.0–52.0)
Hemoglobin: 11.8 g/dL — ABNORMAL LOW (ref 13.0–17.0)
Lymphocytes Relative: 19 % (ref 12.0–46.0)
Lymphs Abs: 1.7 10*3/uL (ref 0.7–4.0)
MCHC: 32.6 g/dL (ref 30.0–36.0)
MCV: 91.8 fL (ref 78.0–100.0)
Monocytes Absolute: 0.8 10*3/uL (ref 0.1–1.0)
Monocytes Relative: 8.7 % (ref 3.0–12.0)
Neutro Abs: 6.1 10*3/uL (ref 1.4–7.7)
Neutrophils Relative %: 69.3 % (ref 43.0–77.0)
Platelets: 109 10*3/uL — ABNORMAL LOW (ref 150.0–400.0)
RBC: 3.93 Mil/uL — ABNORMAL LOW (ref 4.22–5.81)
RDW: 14.9 % (ref 11.5–15.5)
WBC: 8.8 10*3/uL (ref 4.0–10.5)

## 2023-04-20 LAB — URINALYSIS, ROUTINE W REFLEX MICROSCOPIC
Bilirubin Urine: NEGATIVE
Hgb urine dipstick: NEGATIVE
Ketones, ur: NEGATIVE
Leukocytes,Ua: NEGATIVE
Nitrite: NEGATIVE
Specific Gravity, Urine: 1.02 (ref 1.000–1.030)
Urine Glucose: NEGATIVE
Urobilinogen, UA: 1 (ref 0.0–1.0)
pH: 7.5 (ref 5.0–8.0)

## 2023-04-20 LAB — COMPREHENSIVE METABOLIC PANEL
ALT: 12 U/L (ref 0–53)
AST: 16 U/L (ref 0–37)
Albumin: 3.6 g/dL (ref 3.5–5.2)
Alkaline Phosphatase: 100 U/L (ref 39–117)
BUN: 12 mg/dL (ref 6–23)
CO2: 29 meq/L (ref 19–32)
Calcium: 9.1 mg/dL (ref 8.4–10.5)
Chloride: 100 meq/L (ref 96–112)
Creatinine, Ser: 0.88 mg/dL (ref 0.40–1.50)
GFR: 77.17 mL/min (ref >60.00)
Glucose, Bld: 92 mg/dL (ref 70–99)
Potassium: 3.7 meq/L (ref 3.5–5.1)
Sodium: 137 meq/L (ref 135–145)
Total Bilirubin: 1.1 mg/dL (ref 0.2–1.2)
Total Protein: 5.9 g/dL — ABNORMAL LOW (ref 6.0–8.3)

## 2023-04-20 LAB — C-REACTIVE PROTEIN: CRP: <1 mg/dL (ref 0.5–20.0)

## 2023-04-20 LAB — SEDIMENTATION RATE: Sed Rate: 17 mm/h (ref 0–20)

## 2023-04-20 NOTE — Patient Instructions (Signed)
Let us know if you have not heard back regarding the CT scan in next few days.

## 2023-04-20 NOTE — Progress Notes (Signed)
Established Patient Office Visit  Subjective   Patient ID: Willie Lynch, male    DOB: 11-08-1933  Age: 89 y.o. MRN: [id number]  Chief Complaint  Patient presents with   Nausea    Patient complains of nausea, x2 months, Patient reports no diarrhea     HPI   Willie Lynch is seen accompanied by one of his sons for follow-up.  He has had some chronic nausea now for months along with several months of documented weight loss.  His weight was 173 pounds back in February of this past year and down to 153 pounds in September and current weight of 144 pounds.  Appetite somewhat up-and-down but more down past several weeks.  Frequent nausea without vomiting.  He was seen here back in August with some vomiting and referred to hospital for further evaluation. CT renal stone study at that time showed bilateral hydro nephrosis with markedly distended urinary bladder and urine retention.  Also comment of marked enlargement of prostate gland.  That scan also commented on shotty lymphadenopathy throughout the abdomen and pelvis suspicious for possible lymphoproliferative disorder with differential diagnoses also including possible metastatic disease-see report below.  There was mention of cholelithiasis but no evidence for acute cholecystitis.  Recent CT head showed no acute changes.  He denies any abdominal pain.  No diaphoresis or night sweats.  No fevers or chills.  Quit smoking in his 3s after about 5-year history.  Denies any current urinary symptoms.  Does have some cognitive impairment takes Aricept 10 mg daily along with several other medications.  His chronic problems include history of CAD, hypertension, GERD, osteoarthritis, BPH, restless leg syndrome  Past Medical History:  Diagnosis Date   Abdominal pain, unspecified site    Acquired cyst of kidney    Allergic rhinitis, cause unspecified    CAD (coronary artery disease)    NSTEMI 5/10. Rotational atherectomy/PCI w Xience DES x 3 to RCA and  rotation atherectomy /PCI w Xience DES to prox LAD   Diverticulosis of colon (without mention of hemorrhage)    last colon 11/05 by Dr[facility]Le[facility] w divertics only   Elevated prostate specific antigen (PSA)    followed by Dr Vonita Moss for urology   GERD (gastroesophageal reflux disease)    esophagitis   Hyperlipidemia    Hypertension    ACEI cough   Irritable bowel syndrome    Ischemic cardiomyopathy    mild echo (8/10) w EF 45-50%, diffuse hypokinesis, mild AI and mild MR   Lumbago    Migraine, unspecified, without mention of intractable migraine without mention of status migrainosus    Osteoarthrosis, unspecified whether generalized or localized, unspecified site    Past Surgical History:  Procedure Laterality Date   ANGIOPLASTY     bilat inguinal hernia repairs  7/06   Dr Daphine Deutscher   CARDIAC CATHETERIZATION     CORONARY ANGIOPLASTY     x4   rotator cuff surgery     Dr. Simonne Come    reports that he quit smoking about 61 years ago. His smoking use included cigarettes. He has never used smokeless tobacco. He reports current alcohol use. He reports that he does not use drugs. family history includes Cancer in his mother and another family member; Heart attack in his father; Heart disease in his father. Allergies  Allergen Reactions   Amoxicillin-Pot Clavulanate Hives and Other (See Comments)    Patient developed HIVES on augmentin   Codeine Other (See Comments)    Exact reaction not  recalled by the patient   Morphine Other (See Comments)    Exact reaction not recalled by the patient    Review of Systems  Constitutional:  Positive for weight loss. Negative for chills and fever.  Respiratory:  Negative for cough, hemoptysis and shortness of breath.   Cardiovascular:  Negative for chest pain.  Gastrointestinal:  Positive for nausea. Negative for abdominal pain, blood in stool, constipation, diarrhea, melena and vomiting.  Genitourinary:  Negative for dysuria and hematuria.   Neurological:  Negative for headaches.      Objective:     BP 128/68 (BP Location: Left Arm, Patient Position: Sitting, Cuff Size: Normal)   Pulse (!) 55   Temp (!) 97.5 F (36.4 C) (Oral)   Ht 5\' 11"  (1.803 m)   Wt 144 lb 14.4 oz (65.7 kg)   SpO2 96%   BMI 20.21 kg/m  BP Readings from Last 3 Encounters:  04/20/23 128/68  03/14/23 121/73  03/01/23 (!) 90/50   Wt Readings from Last 3 Encounters:  04/20/23 144 lb 14.4 oz (65.7 kg)  03/01/23 153 lb 4.8 oz (69.5 kg)  02/10/23 159 lb 13.3 oz (72.5 kg)      Physical Exam Vitals reviewed.  Constitutional:      General: He is not in acute distress.    Appearance: He is well-developed. He is not ill-appearing.  Eyes:     Pupils: Pupils are equal, round, and reactive to light.  Neck:     Thyroid: No thyromegaly.     Comments: No supraclavicular adenopathy noted Cardiovascular:     Rate and Rhythm: Normal rate and regular rhythm.  Pulmonary:     Effort: Pulmonary effort is normal. No respiratory distress.     Breath sounds: Normal breath sounds. No wheezing or rales.  Abdominal:     Comments: Nondistended.  Normal bowel sounds.  Soft with no masses palpated.  No hepatomegaly or splenomegaly noted.  Musculoskeletal:     Cervical back: Neck supple.     Right lower leg: No edema.     Left lower leg: No edema.     Comments: Bilateral shotty inguinal nodes  Lymphadenopathy:     Cervical: No cervical adenopathy.  Neurological:     Mental Status: He is alert and oriented to person, place, and time.      No results found for any visits on 04/20/23.  Last CBC Lab Results  Component Value Date   WBC 7.7 02/12/2023   HGB 12.8 (L) 02/12/2023   HCT 38.4 (L) 02/12/2023   MCV 93.9 02/12/2023   MCH 31.3 02/12/2023   RDW 14.1 02/12/2023   PLT 90 (L) 02/12/2023   Last metabolic panel Lab Results  Component Value Date   GLUCOSE 137 (H) 02/13/2023   NA 134 (L) 02/13/2023   K 3.3 (L) 02/13/2023   CL 102 02/13/2023    CO2 27 02/13/2023   BUN 8 02/13/2023   CREATININE 0.63 02/13/2023   GFRNONAA >60 02/13/2023   CALCIUM 8.1 (L) 02/13/2023   PHOS 1.9 (L) 02/13/2023   PROT 5.1 (L) 02/12/2023   ALBUMIN 2.9 (L) 02/12/2023   BILITOT 1.5 (H) 02/12/2023   ALKPHOS 60 02/12/2023   AST 14 (L) 02/12/2023   ALT 12 02/12/2023   ANIONGAP 5 02/13/2023   Last lipids Lab Results  Component Value Date   CHOL 155 08/03/2022   HDL 41.90 08/03/2022   LDLCALC 98 08/03/2022   TRIG 73.0 08/03/2022   CHOLHDL 4 08/03/2022   Last  hemoglobin A1c Lab Results  Component Value Date   HGBA1C 5.5 07/31/2019   Last thyroid functions Lab Results  Component Value Date   TSH 3.18 08/25/2019      The ASCVD Risk score (Arnett DK, et al., 2019) failed to calculate for the following reasons:   The 2019 ASCVD risk score is only valid for ages 58 to 81    Assessment & Plan:   Problem List Items Addressed This Visit   None Visit Diagnoses     Unintended weight loss    -  Primary   Relevant Orders   CBC with Differential/Platelet   Sedimentation rate   CMP   C-reactive Protein   Lactate Dehydrogenase   Urinalysis with Reflex Microscopic   DG Chest 2 View     89 year old male with chronic problems as above who is seen with progressive unintentional weight loss and persistent nausea without recent vomiting.  Had acute urinary retention episode back in August.  CT at that time showed some shoddy adenopathy pelvis and abdomen with concern for possible lymphoproliferative disorder versus possible metastatic cancer.  -Obtain PA and lateral chest x-ray -Obtain labs as above -If any evidence for progressive adenopathy consider possible biopsy options.  No follow-ups on file.    Evelena Peat, MD

## 2023-04-21 ENCOUNTER — Ambulatory Visit
Admission: RE | Admit: 2023-04-21 | Discharge: 2023-04-21 | Disposition: A | Discharge status: Home / Self Care | Payer: Medicare Other | Source: Ambulatory Visit | Attending: Family Medicine | Admitting: Family Medicine

## 2023-04-21 DIAGNOSIS — K6389 Other specified diseases of intestine: Secondary | ICD-10-CM | POA: Diagnosis not present

## 2023-04-21 DIAGNOSIS — R1114 Bilious vomiting: Secondary | ICD-10-CM

## 2023-04-21 DIAGNOSIS — R112 Nausea with vomiting, unspecified: Secondary | ICD-10-CM | POA: Diagnosis not present

## 2023-04-21 DIAGNOSIS — R599 Enlarged lymph nodes, unspecified: Secondary | ICD-10-CM | POA: Diagnosis not present

## 2023-04-21 LAB — LACTATE DEHYDROGENASE: LDH: 135 U/L (ref 120–250)

## 2023-04-29 ENCOUNTER — Telehealth: Payer: Self-pay | Admitting: Family Medicine

## 2023-04-29 NOTE — Telephone Encounter (Signed)
Pt son mike is calling and would like ct abd/pelvis result. Dr Caryl Never order

## 2023-04-29 NOTE — Telephone Encounter (Signed)
The results are not back yet-- is there any way you could call and ask them to read it?

## 2023-04-29 NOTE — Telephone Encounter (Signed)
I spoke with Diane at the reading room and she has made this order STAT for Over-read.

## 2023-04-30 ENCOUNTER — Encounter: Payer: Self-pay | Admitting: Family Medicine

## 2023-04-30 DIAGNOSIS — R590 Localized enlarged lymph nodes: Secondary | ICD-10-CM

## 2023-05-06 NOTE — Telephone Encounter (Signed)
Noted  

## 2023-05-06 NOTE — Telephone Encounter (Signed)
I spoke with Willie Lynch-- the patient is moving to [place] on Saturday. He gave me the name of the place he wants to take his dad for his care. I will place the order for him STAT.

## 2023-05-11 NOTE — Telephone Encounter (Addendum)
Pt son mike is calling and would like to know if medical records was sent to number he gave dr Casimiro Needle and he would like a callback

## 2023-05-11 NOTE — Telephone Encounter (Signed)
I sent the referral to the number he gave me but not the medical records--

## 2023-05-31 ENCOUNTER — Ambulatory Visit: Payer: Medicare Other | Admitting: Family Medicine

## 2023-06-04 DIAGNOSIS — I880 Nonspecific mesenteric lymphadenitis: Secondary | ICD-10-CM | POA: Diagnosis not present

## 2023-06-23 ENCOUNTER — Encounter: Payer: Self-pay | Admitting: Family Medicine

## 2023-08-02 ENCOUNTER — Ambulatory Visit: Payer: Medicare Other | Admitting: Family Medicine

## 2024-05-04 ENCOUNTER — Telehealth: Payer: Self-pay

## 2024-05-04 NOTE — Telephone Encounter (Signed)
 Patients son states that the patient is in Hospice care in [place].
